# Patient Record
Sex: Male | Born: 1950 | Race: White | Hispanic: No | Smoking: Former smoker
Health system: Southern US, Community
[De-identification: ages and names within clinical notes are randomized; demographics above are authoritative.]

## PROBLEM LIST (undated history)

## (undated) DIAGNOSIS — E119 Type 2 diabetes mellitus without complications: Secondary | ICD-10-CM

## (undated) DIAGNOSIS — E785 Hyperlipidemia, unspecified: Secondary | ICD-10-CM

## (undated) DIAGNOSIS — J9610 Chronic respiratory failure, unspecified whether with hypoxia or hypercapnia: Secondary | ICD-10-CM

## (undated) DIAGNOSIS — M1990 Unspecified osteoarthritis, unspecified site: Secondary | ICD-10-CM

## (undated) DIAGNOSIS — I255 Ischemic cardiomyopathy: Secondary | ICD-10-CM

## (undated) DIAGNOSIS — I739 Peripheral vascular disease, unspecified: Secondary | ICD-10-CM

## (undated) DIAGNOSIS — I482 Chronic atrial fibrillation, unspecified: Secondary | ICD-10-CM

## (undated) DIAGNOSIS — R0600 Dyspnea, unspecified: Secondary | ICD-10-CM

## (undated) DIAGNOSIS — F1010 Alcohol abuse, uncomplicated: Secondary | ICD-10-CM

## (undated) DIAGNOSIS — I509 Heart failure, unspecified: Secondary | ICD-10-CM

## (undated) DIAGNOSIS — J449 Chronic obstructive pulmonary disease, unspecified: Secondary | ICD-10-CM

## (undated) DIAGNOSIS — I10 Essential (primary) hypertension: Secondary | ICD-10-CM

## (undated) DIAGNOSIS — Z95810 Presence of automatic (implantable) cardiac defibrillator: Secondary | ICD-10-CM

## (undated) DIAGNOSIS — Z7901 Long term (current) use of anticoagulants: Secondary | ICD-10-CM

## (undated) HISTORY — DX: Ischemic cardiomyopathy: I25.5

## (undated) HISTORY — DX: Peripheral vascular disease, unspecified: I73.9

## (undated) HISTORY — PX: CHOLECYSTECTOMY: SHX55

## (undated) HISTORY — DX: Essential (primary) hypertension: I10

## (undated) HISTORY — DX: Long term (current) use of anticoagulants: Z79.01

## (undated) HISTORY — DX: Chronic respiratory failure, unspecified whether with hypoxia or hypercapnia: J96.10

## (undated) HISTORY — DX: Alcohol abuse, uncomplicated: F10.10

## (undated) HISTORY — DX: Hyperlipidemia, unspecified: E78.5

## (undated) HISTORY — DX: Presence of automatic (implantable) cardiac defibrillator: Z95.810

## (undated) HISTORY — PX: ERCP: SHX60

## (undated) HISTORY — DX: Chronic atrial fibrillation, unspecified: I48.20

## (undated) HISTORY — DX: Chronic obstructive pulmonary disease, unspecified: J44.9

## (undated) HISTORY — PX: COLONOSCOPY: SHX174

## (undated) HISTORY — DX: Heart failure, unspecified: I50.9

## (undated) HISTORY — DX: Type 2 diabetes mellitus without complications: E11.9

## (undated) HISTORY — PX: EXCISION OF BREAST BIOPSY: SHX5822

---

## 2014-08-04 DIAGNOSIS — I4891 Unspecified atrial fibrillation: Secondary | ICD-10-CM | POA: Diagnosis not present

## 2014-08-25 DIAGNOSIS — I4891 Unspecified atrial fibrillation: Secondary | ICD-10-CM | POA: Diagnosis not present

## 2014-09-25 DIAGNOSIS — I4891 Unspecified atrial fibrillation: Secondary | ICD-10-CM | POA: Diagnosis not present

## 2014-10-11 DIAGNOSIS — I10 Essential (primary) hypertension: Secondary | ICD-10-CM | POA: Diagnosis not present

## 2014-10-11 DIAGNOSIS — E1165 Type 2 diabetes mellitus with hyperglycemia: Secondary | ICD-10-CM | POA: Diagnosis not present

## 2014-10-11 DIAGNOSIS — E782 Mixed hyperlipidemia: Secondary | ICD-10-CM | POA: Diagnosis not present

## 2014-10-18 DIAGNOSIS — E1169 Type 2 diabetes mellitus with other specified complication: Secondary | ICD-10-CM | POA: Diagnosis not present

## 2014-10-18 DIAGNOSIS — E1165 Type 2 diabetes mellitus with hyperglycemia: Secondary | ICD-10-CM | POA: Diagnosis not present

## 2014-10-18 DIAGNOSIS — J449 Chronic obstructive pulmonary disease, unspecified: Secondary | ICD-10-CM | POA: Diagnosis not present

## 2014-10-18 DIAGNOSIS — I4891 Unspecified atrial fibrillation: Secondary | ICD-10-CM | POA: Diagnosis not present

## 2014-10-24 DIAGNOSIS — I4891 Unspecified atrial fibrillation: Secondary | ICD-10-CM | POA: Diagnosis not present

## 2014-11-07 DIAGNOSIS — I4891 Unspecified atrial fibrillation: Secondary | ICD-10-CM | POA: Diagnosis not present

## 2014-11-16 DIAGNOSIS — Z6839 Body mass index (BMI) 39.0-39.9, adult: Secondary | ICD-10-CM | POA: Diagnosis not present

## 2014-11-16 DIAGNOSIS — J449 Chronic obstructive pulmonary disease, unspecified: Secondary | ICD-10-CM | POA: Diagnosis not present

## 2014-11-21 DIAGNOSIS — I4891 Unspecified atrial fibrillation: Secondary | ICD-10-CM | POA: Diagnosis not present

## 2014-11-29 DIAGNOSIS — Z1389 Encounter for screening for other disorder: Secondary | ICD-10-CM | POA: Diagnosis not present

## 2014-11-29 DIAGNOSIS — I4891 Unspecified atrial fibrillation: Secondary | ICD-10-CM | POA: Diagnosis not present

## 2014-11-29 DIAGNOSIS — Z6841 Body Mass Index (BMI) 40.0 and over, adult: Secondary | ICD-10-CM | POA: Diagnosis not present

## 2014-11-29 DIAGNOSIS — Z0000 Encounter for general adult medical examination without abnormal findings: Secondary | ICD-10-CM | POA: Diagnosis not present

## 2014-11-29 DIAGNOSIS — Z139 Encounter for screening, unspecified: Secondary | ICD-10-CM | POA: Diagnosis not present

## 2014-12-28 DIAGNOSIS — I4891 Unspecified atrial fibrillation: Secondary | ICD-10-CM | POA: Diagnosis not present

## 2015-01-25 DIAGNOSIS — Z6841 Body Mass Index (BMI) 40.0 and over, adult: Secondary | ICD-10-CM | POA: Diagnosis not present

## 2015-01-25 DIAGNOSIS — I4891 Unspecified atrial fibrillation: Secondary | ICD-10-CM | POA: Diagnosis not present

## 2015-01-25 DIAGNOSIS — L0291 Cutaneous abscess, unspecified: Secondary | ICD-10-CM | POA: Diagnosis not present

## 2015-01-26 DIAGNOSIS — I4891 Unspecified atrial fibrillation: Secondary | ICD-10-CM | POA: Diagnosis not present

## 2015-02-06 DIAGNOSIS — I4891 Unspecified atrial fibrillation: Secondary | ICD-10-CM | POA: Diagnosis not present

## 2015-02-08 DIAGNOSIS — E1169 Type 2 diabetes mellitus with other specified complication: Secondary | ICD-10-CM | POA: Diagnosis not present

## 2015-02-08 DIAGNOSIS — E1165 Type 2 diabetes mellitus with hyperglycemia: Secondary | ICD-10-CM | POA: Diagnosis not present

## 2015-02-08 DIAGNOSIS — E1159 Type 2 diabetes mellitus with other circulatory complications: Secondary | ICD-10-CM | POA: Diagnosis not present

## 2015-02-15 DIAGNOSIS — I10 Essential (primary) hypertension: Secondary | ICD-10-CM | POA: Diagnosis not present

## 2015-02-15 DIAGNOSIS — J449 Chronic obstructive pulmonary disease, unspecified: Secondary | ICD-10-CM | POA: Diagnosis not present

## 2015-02-15 DIAGNOSIS — L723 Sebaceous cyst: Secondary | ICD-10-CM | POA: Diagnosis not present

## 2015-02-15 DIAGNOSIS — E1159 Type 2 diabetes mellitus with other circulatory complications: Secondary | ICD-10-CM | POA: Diagnosis not present

## 2015-02-28 DIAGNOSIS — K8050 Calculus of bile duct without cholangitis or cholecystitis without obstruction: Secondary | ICD-10-CM | POA: Diagnosis not present

## 2015-02-28 DIAGNOSIS — J9811 Atelectasis: Secondary | ICD-10-CM | POA: Diagnosis not present

## 2015-02-28 DIAGNOSIS — R079 Chest pain, unspecified: Secondary | ICD-10-CM | POA: Diagnosis not present

## 2015-02-28 DIAGNOSIS — K429 Umbilical hernia without obstruction or gangrene: Secondary | ICD-10-CM | POA: Diagnosis not present

## 2015-02-28 DIAGNOSIS — N200 Calculus of kidney: Secondary | ICD-10-CM | POA: Diagnosis not present

## 2015-03-02 DIAGNOSIS — K8050 Calculus of bile duct without cholangitis or cholecystitis without obstruction: Secondary | ICD-10-CM | POA: Diagnosis not present

## 2015-03-02 DIAGNOSIS — I4891 Unspecified atrial fibrillation: Secondary | ICD-10-CM | POA: Diagnosis not present

## 2015-03-02 DIAGNOSIS — L723 Sebaceous cyst: Secondary | ICD-10-CM | POA: Diagnosis not present

## 2015-03-02 DIAGNOSIS — J449 Chronic obstructive pulmonary disease, unspecified: Secondary | ICD-10-CM | POA: Diagnosis not present

## 2015-03-12 DIAGNOSIS — I4891 Unspecified atrial fibrillation: Secondary | ICD-10-CM | POA: Diagnosis not present

## 2015-03-13 DIAGNOSIS — R1013 Epigastric pain: Secondary | ICD-10-CM | POA: Diagnosis not present

## 2015-03-13 DIAGNOSIS — I4891 Unspecified atrial fibrillation: Secondary | ICD-10-CM | POA: Diagnosis not present

## 2015-03-13 DIAGNOSIS — R1011 Right upper quadrant pain: Secondary | ICD-10-CM | POA: Diagnosis not present

## 2015-03-13 DIAGNOSIS — E119 Type 2 diabetes mellitus without complications: Secondary | ICD-10-CM | POA: Diagnosis not present

## 2015-03-15 DIAGNOSIS — I4891 Unspecified atrial fibrillation: Secondary | ICD-10-CM | POA: Diagnosis not present

## 2015-03-22 DIAGNOSIS — I4891 Unspecified atrial fibrillation: Secondary | ICD-10-CM | POA: Diagnosis not present

## 2015-03-25 DIAGNOSIS — Z01810 Encounter for preprocedural cardiovascular examination: Secondary | ICD-10-CM

## 2015-03-25 HISTORY — DX: Encounter for preprocedural cardiovascular examination: Z01.810

## 2015-03-26 DIAGNOSIS — I482 Chronic atrial fibrillation: Secondary | ICD-10-CM | POA: Diagnosis not present

## 2015-03-26 DIAGNOSIS — Z7901 Long term (current) use of anticoagulants: Secondary | ICD-10-CM | POA: Diagnosis not present

## 2015-03-26 DIAGNOSIS — Z79899 Other long term (current) drug therapy: Secondary | ICD-10-CM | POA: Diagnosis not present

## 2015-03-26 DIAGNOSIS — I5022 Chronic systolic (congestive) heart failure: Secondary | ICD-10-CM | POA: Diagnosis not present

## 2015-03-26 DIAGNOSIS — I110 Hypertensive heart disease with heart failure: Secondary | ICD-10-CM | POA: Diagnosis not present

## 2015-03-29 DIAGNOSIS — I4891 Unspecified atrial fibrillation: Secondary | ICD-10-CM | POA: Diagnosis not present

## 2015-04-06 DIAGNOSIS — K819 Cholecystitis, unspecified: Secondary | ICD-10-CM | POA: Diagnosis not present

## 2015-04-06 DIAGNOSIS — Z79899 Other long term (current) drug therapy: Secondary | ICD-10-CM | POA: Diagnosis not present

## 2015-04-06 DIAGNOSIS — I9581 Postprocedural hypotension: Secondary | ICD-10-CM | POA: Diagnosis not present

## 2015-04-06 DIAGNOSIS — R9431 Abnormal electrocardiogram [ECG] [EKG]: Secondary | ICD-10-CM | POA: Diagnosis not present

## 2015-04-06 DIAGNOSIS — R945 Abnormal results of liver function studies: Secondary | ICD-10-CM | POA: Diagnosis not present

## 2015-04-06 DIAGNOSIS — K8044 Calculus of bile duct with chronic cholecystitis without obstruction: Secondary | ICD-10-CM | POA: Diagnosis not present

## 2015-04-06 DIAGNOSIS — Z9049 Acquired absence of other specified parts of digestive tract: Secondary | ICD-10-CM | POA: Diagnosis not present

## 2015-04-06 DIAGNOSIS — I482 Chronic atrial fibrillation: Secondary | ICD-10-CM | POA: Diagnosis not present

## 2015-04-06 DIAGNOSIS — K8071 Calculus of gallbladder and bile duct without cholecystitis with obstruction: Secondary | ICD-10-CM | POA: Diagnosis not present

## 2015-04-06 DIAGNOSIS — I420 Dilated cardiomyopathy: Secondary | ICD-10-CM | POA: Diagnosis not present

## 2015-04-06 DIAGNOSIS — I429 Cardiomyopathy, unspecified: Secondary | ICD-10-CM | POA: Diagnosis not present

## 2015-04-06 DIAGNOSIS — Z6837 Body mass index (BMI) 37.0-37.9, adult: Secondary | ICD-10-CM | POA: Diagnosis not present

## 2015-04-06 DIAGNOSIS — R000 Tachycardia, unspecified: Secondary | ICD-10-CM | POA: Diagnosis not present

## 2015-04-06 DIAGNOSIS — K8050 Calculus of bile duct without cholangitis or cholecystitis without obstruction: Secondary | ICD-10-CM | POA: Diagnosis not present

## 2015-04-06 DIAGNOSIS — K8040 Calculus of bile duct with cholecystitis, unspecified, without obstruction: Secondary | ICD-10-CM | POA: Diagnosis not present

## 2015-04-06 DIAGNOSIS — I5042 Chronic combined systolic (congestive) and diastolic (congestive) heart failure: Secondary | ICD-10-CM | POA: Diagnosis not present

## 2015-04-06 DIAGNOSIS — E669 Obesity, unspecified: Secondary | ICD-10-CM | POA: Diagnosis not present

## 2015-04-06 DIAGNOSIS — J45909 Unspecified asthma, uncomplicated: Secondary | ICD-10-CM | POA: Diagnosis not present

## 2015-04-06 DIAGNOSIS — K8051 Calculus of bile duct without cholangitis or cholecystitis with obstruction: Secondary | ICD-10-CM | POA: Diagnosis not present

## 2015-04-06 DIAGNOSIS — J449 Chronic obstructive pulmonary disease, unspecified: Secondary | ICD-10-CM | POA: Diagnosis not present

## 2015-04-06 DIAGNOSIS — E780 Pure hypercholesterolemia: Secondary | ICD-10-CM | POA: Diagnosis not present

## 2015-04-06 DIAGNOSIS — I5022 Chronic systolic (congestive) heart failure: Secondary | ICD-10-CM | POA: Diagnosis not present

## 2015-04-06 DIAGNOSIS — R0602 Shortness of breath: Secondary | ICD-10-CM | POA: Diagnosis not present

## 2015-04-06 DIAGNOSIS — Z7901 Long term (current) use of anticoagulants: Secondary | ICD-10-CM | POA: Diagnosis not present

## 2015-04-06 DIAGNOSIS — I447 Left bundle-branch block, unspecified: Secondary | ICD-10-CM | POA: Diagnosis not present

## 2015-04-06 DIAGNOSIS — E1165 Type 2 diabetes mellitus with hyperglycemia: Secondary | ICD-10-CM | POA: Diagnosis not present

## 2015-04-06 DIAGNOSIS — Z23 Encounter for immunization: Secondary | ICD-10-CM | POA: Diagnosis not present

## 2015-04-06 DIAGNOSIS — Z87891 Personal history of nicotine dependence: Secondary | ICD-10-CM | POA: Diagnosis not present

## 2015-04-06 DIAGNOSIS — I10 Essential (primary) hypertension: Secondary | ICD-10-CM | POA: Diagnosis not present

## 2015-04-06 DIAGNOSIS — K8010 Calculus of gallbladder with chronic cholecystitis without obstruction: Secondary | ICD-10-CM | POA: Diagnosis not present

## 2015-04-06 DIAGNOSIS — I4891 Unspecified atrial fibrillation: Secondary | ICD-10-CM | POA: Diagnosis not present

## 2015-04-06 DIAGNOSIS — I9789 Other postprocedural complications and disorders of the circulatory system, not elsewhere classified: Secondary | ICD-10-CM | POA: Diagnosis not present

## 2015-04-06 DIAGNOSIS — I959 Hypotension, unspecified: Secondary | ICD-10-CM | POA: Diagnosis not present

## 2015-04-06 DIAGNOSIS — I519 Heart disease, unspecified: Secondary | ICD-10-CM | POA: Diagnosis not present

## 2015-04-08 DIAGNOSIS — I4891 Unspecified atrial fibrillation: Secondary | ICD-10-CM | POA: Diagnosis not present

## 2015-04-08 DIAGNOSIS — I447 Left bundle-branch block, unspecified: Secondary | ICD-10-CM | POA: Diagnosis not present

## 2015-04-13 DIAGNOSIS — Z7901 Long term (current) use of anticoagulants: Secondary | ICD-10-CM | POA: Diagnosis not present

## 2015-04-13 DIAGNOSIS — I447 Left bundle-branch block, unspecified: Secondary | ICD-10-CM | POA: Diagnosis not present

## 2015-04-13 DIAGNOSIS — J45909 Unspecified asthma, uncomplicated: Secondary | ICD-10-CM | POA: Diagnosis not present

## 2015-04-13 DIAGNOSIS — Z79899 Other long term (current) drug therapy: Secondary | ICD-10-CM | POA: Diagnosis not present

## 2015-04-13 DIAGNOSIS — Z87891 Personal history of nicotine dependence: Secondary | ICD-10-CM | POA: Diagnosis not present

## 2015-04-13 DIAGNOSIS — I482 Chronic atrial fibrillation: Secondary | ICD-10-CM | POA: Diagnosis not present

## 2015-04-13 DIAGNOSIS — J449 Chronic obstructive pulmonary disease, unspecified: Secondary | ICD-10-CM | POA: Diagnosis not present

## 2015-04-13 DIAGNOSIS — Z23 Encounter for immunization: Secondary | ICD-10-CM | POA: Diagnosis not present

## 2015-04-13 DIAGNOSIS — I10 Essential (primary) hypertension: Secondary | ICD-10-CM | POA: Diagnosis not present

## 2015-04-13 DIAGNOSIS — I429 Cardiomyopathy, unspecified: Secondary | ICD-10-CM | POA: Diagnosis not present

## 2015-04-13 DIAGNOSIS — E780 Pure hypercholesterolemia: Secondary | ICD-10-CM | POA: Diagnosis not present

## 2015-04-13 DIAGNOSIS — I519 Heart disease, unspecified: Secondary | ICD-10-CM | POA: Diagnosis not present

## 2015-04-13 DIAGNOSIS — I9789 Other postprocedural complications and disorders of the circulatory system, not elsewhere classified: Secondary | ICD-10-CM | POA: Diagnosis not present

## 2015-04-13 DIAGNOSIS — I5022 Chronic systolic (congestive) heart failure: Secondary | ICD-10-CM | POA: Diagnosis not present

## 2015-04-13 DIAGNOSIS — E1165 Type 2 diabetes mellitus with hyperglycemia: Secondary | ICD-10-CM | POA: Diagnosis not present

## 2015-04-13 DIAGNOSIS — I9581 Postprocedural hypotension: Secondary | ICD-10-CM | POA: Diagnosis not present

## 2015-04-13 DIAGNOSIS — K8044 Calculus of bile duct with chronic cholecystitis without obstruction: Secondary | ICD-10-CM | POA: Diagnosis not present

## 2015-04-14 DIAGNOSIS — J449 Chronic obstructive pulmonary disease, unspecified: Secondary | ICD-10-CM | POA: Diagnosis not present

## 2015-04-23 DIAGNOSIS — I5022 Chronic systolic (congestive) heart failure: Secondary | ICD-10-CM | POA: Diagnosis not present

## 2015-04-23 DIAGNOSIS — K8040 Calculus of bile duct with cholecystitis, unspecified, without obstruction: Secondary | ICD-10-CM | POA: Diagnosis not present

## 2015-04-23 DIAGNOSIS — I4891 Unspecified atrial fibrillation: Secondary | ICD-10-CM | POA: Diagnosis not present

## 2015-04-23 DIAGNOSIS — Z9049 Acquired absence of other specified parts of digestive tract: Secondary | ICD-10-CM | POA: Diagnosis not present

## 2015-05-02 DIAGNOSIS — I4891 Unspecified atrial fibrillation: Secondary | ICD-10-CM | POA: Diagnosis not present

## 2015-05-08 DIAGNOSIS — I482 Chronic atrial fibrillation: Secondary | ICD-10-CM | POA: Diagnosis not present

## 2015-05-08 DIAGNOSIS — I110 Hypertensive heart disease with heart failure: Secondary | ICD-10-CM | POA: Diagnosis not present

## 2015-05-08 DIAGNOSIS — I5022 Chronic systolic (congestive) heart failure: Secondary | ICD-10-CM | POA: Diagnosis not present

## 2015-05-14 DIAGNOSIS — J449 Chronic obstructive pulmonary disease, unspecified: Secondary | ICD-10-CM | POA: Diagnosis not present

## 2015-05-15 DIAGNOSIS — J449 Chronic obstructive pulmonary disease, unspecified: Secondary | ICD-10-CM | POA: Diagnosis not present

## 2015-05-22 DIAGNOSIS — E785 Hyperlipidemia, unspecified: Secondary | ICD-10-CM | POA: Diagnosis not present

## 2015-05-22 DIAGNOSIS — Z6836 Body mass index (BMI) 36.0-36.9, adult: Secondary | ICD-10-CM | POA: Diagnosis not present

## 2015-05-22 DIAGNOSIS — E1165 Type 2 diabetes mellitus with hyperglycemia: Secondary | ICD-10-CM | POA: Diagnosis not present

## 2015-05-22 DIAGNOSIS — I10 Essential (primary) hypertension: Secondary | ICD-10-CM | POA: Diagnosis not present

## 2015-05-22 DIAGNOSIS — E119 Type 2 diabetes mellitus without complications: Secondary | ICD-10-CM | POA: Diagnosis not present

## 2015-06-06 DIAGNOSIS — E1165 Type 2 diabetes mellitus with hyperglycemia: Secondary | ICD-10-CM | POA: Diagnosis not present

## 2015-06-06 DIAGNOSIS — I4891 Unspecified atrial fibrillation: Secondary | ICD-10-CM | POA: Diagnosis not present

## 2015-06-14 DIAGNOSIS — J449 Chronic obstructive pulmonary disease, unspecified: Secondary | ICD-10-CM | POA: Diagnosis not present

## 2015-06-15 DIAGNOSIS — J449 Chronic obstructive pulmonary disease, unspecified: Secondary | ICD-10-CM | POA: Diagnosis not present

## 2015-06-20 DIAGNOSIS — I482 Chronic atrial fibrillation: Secondary | ICD-10-CM | POA: Diagnosis not present

## 2015-06-20 DIAGNOSIS — I447 Left bundle-branch block, unspecified: Secondary | ICD-10-CM

## 2015-06-20 DIAGNOSIS — I5022 Chronic systolic (congestive) heart failure: Secondary | ICD-10-CM | POA: Diagnosis not present

## 2015-06-20 DIAGNOSIS — I110 Hypertensive heart disease with heart failure: Secondary | ICD-10-CM | POA: Diagnosis not present

## 2015-06-20 HISTORY — DX: Left bundle-branch block, unspecified: I44.7

## 2015-06-26 DIAGNOSIS — I4891 Unspecified atrial fibrillation: Secondary | ICD-10-CM | POA: Diagnosis not present

## 2015-07-14 DIAGNOSIS — J449 Chronic obstructive pulmonary disease, unspecified: Secondary | ICD-10-CM | POA: Diagnosis not present

## 2015-11-06 DIAGNOSIS — I255 Ischemic cardiomyopathy: Secondary | ICD-10-CM | POA: Insufficient documentation

## 2016-08-07 DIAGNOSIS — G894 Chronic pain syndrome: Secondary | ICD-10-CM | POA: Diagnosis not present

## 2016-08-07 DIAGNOSIS — M1611 Unilateral primary osteoarthritis, right hip: Secondary | ICD-10-CM | POA: Diagnosis not present

## 2016-08-07 DIAGNOSIS — Z720 Tobacco use: Secondary | ICD-10-CM | POA: Diagnosis not present

## 2016-08-07 DIAGNOSIS — J449 Chronic obstructive pulmonary disease, unspecified: Secondary | ICD-10-CM | POA: Diagnosis not present

## 2016-08-07 DIAGNOSIS — M1612 Unilateral primary osteoarthritis, left hip: Secondary | ICD-10-CM | POA: Diagnosis not present

## 2016-08-07 DIAGNOSIS — M170 Bilateral primary osteoarthritis of knee: Secondary | ICD-10-CM | POA: Diagnosis not present

## 2016-08-07 DIAGNOSIS — M159 Polyosteoarthritis, unspecified: Secondary | ICD-10-CM | POA: Diagnosis not present

## 2016-08-20 DIAGNOSIS — I5022 Chronic systolic (congestive) heart failure: Secondary | ICD-10-CM | POA: Diagnosis not present

## 2016-08-20 DIAGNOSIS — Z95810 Presence of automatic (implantable) cardiac defibrillator: Secondary | ICD-10-CM | POA: Diagnosis not present

## 2016-08-25 DIAGNOSIS — E119 Type 2 diabetes mellitus without complications: Secondary | ICD-10-CM | POA: Diagnosis not present

## 2016-08-25 DIAGNOSIS — E7800 Pure hypercholesterolemia, unspecified: Secondary | ICD-10-CM | POA: Diagnosis not present

## 2016-08-25 DIAGNOSIS — J449 Chronic obstructive pulmonary disease, unspecified: Secondary | ICD-10-CM | POA: Diagnosis not present

## 2016-08-25 DIAGNOSIS — I10 Essential (primary) hypertension: Secondary | ICD-10-CM | POA: Diagnosis not present

## 2016-08-25 DIAGNOSIS — I4891 Unspecified atrial fibrillation: Secondary | ICD-10-CM | POA: Diagnosis not present

## 2016-08-26 DIAGNOSIS — E119 Type 2 diabetes mellitus without complications: Secondary | ICD-10-CM | POA: Diagnosis not present

## 2016-08-26 DIAGNOSIS — I4891 Unspecified atrial fibrillation: Secondary | ICD-10-CM | POA: Diagnosis not present

## 2016-09-03 DIAGNOSIS — I420 Dilated cardiomyopathy: Secondary | ICD-10-CM | POA: Diagnosis not present

## 2016-09-03 DIAGNOSIS — I447 Left bundle-branch block, unspecified: Secondary | ICD-10-CM | POA: Diagnosis not present

## 2016-09-03 DIAGNOSIS — I5022 Chronic systolic (congestive) heart failure: Secondary | ICD-10-CM | POA: Diagnosis not present

## 2016-09-03 DIAGNOSIS — I110 Hypertensive heart disease with heart failure: Secondary | ICD-10-CM | POA: Diagnosis not present

## 2016-09-03 DIAGNOSIS — Z95810 Presence of automatic (implantable) cardiac defibrillator: Secondary | ICD-10-CM | POA: Diagnosis not present

## 2016-09-03 DIAGNOSIS — Z7901 Long term (current) use of anticoagulants: Secondary | ICD-10-CM | POA: Diagnosis not present

## 2016-09-03 DIAGNOSIS — I482 Chronic atrial fibrillation: Secondary | ICD-10-CM | POA: Diagnosis not present

## 2016-09-04 DIAGNOSIS — M170 Bilateral primary osteoarthritis of knee: Secondary | ICD-10-CM | POA: Diagnosis not present

## 2016-09-04 DIAGNOSIS — M1612 Unilateral primary osteoarthritis, left hip: Secondary | ICD-10-CM | POA: Diagnosis not present

## 2016-09-04 DIAGNOSIS — J449 Chronic obstructive pulmonary disease, unspecified: Secondary | ICD-10-CM | POA: Diagnosis not present

## 2016-09-04 DIAGNOSIS — Z720 Tobacco use: Secondary | ICD-10-CM | POA: Diagnosis not present

## 2016-09-04 DIAGNOSIS — M1611 Unilateral primary osteoarthritis, right hip: Secondary | ICD-10-CM | POA: Diagnosis not present

## 2016-09-04 DIAGNOSIS — G894 Chronic pain syndrome: Secondary | ICD-10-CM | POA: Diagnosis not present

## 2016-09-04 DIAGNOSIS — M159 Polyosteoarthritis, unspecified: Secondary | ICD-10-CM | POA: Diagnosis not present

## 2016-09-05 DIAGNOSIS — I4891 Unspecified atrial fibrillation: Secondary | ICD-10-CM | POA: Diagnosis not present

## 2016-09-05 DIAGNOSIS — Z7901 Long term (current) use of anticoagulants: Secondary | ICD-10-CM | POA: Diagnosis not present

## 2016-09-30 DIAGNOSIS — I4891 Unspecified atrial fibrillation: Secondary | ICD-10-CM | POA: Diagnosis not present

## 2016-10-02 DIAGNOSIS — M1611 Unilateral primary osteoarthritis, right hip: Secondary | ICD-10-CM | POA: Diagnosis not present

## 2016-10-02 DIAGNOSIS — M159 Polyosteoarthritis, unspecified: Secondary | ICD-10-CM | POA: Diagnosis not present

## 2016-10-02 DIAGNOSIS — Z7901 Long term (current) use of anticoagulants: Secondary | ICD-10-CM | POA: Diagnosis not present

## 2016-10-02 DIAGNOSIS — M1612 Unilateral primary osteoarthritis, left hip: Secondary | ICD-10-CM | POA: Diagnosis not present

## 2016-10-02 DIAGNOSIS — G894 Chronic pain syndrome: Secondary | ICD-10-CM | POA: Diagnosis not present

## 2016-10-02 DIAGNOSIS — Z720 Tobacco use: Secondary | ICD-10-CM | POA: Diagnosis not present

## 2016-10-02 DIAGNOSIS — M170 Bilateral primary osteoarthritis of knee: Secondary | ICD-10-CM | POA: Diagnosis not present

## 2016-10-02 DIAGNOSIS — J449 Chronic obstructive pulmonary disease, unspecified: Secondary | ICD-10-CM | POA: Diagnosis not present

## 2016-10-13 DIAGNOSIS — I4891 Unspecified atrial fibrillation: Secondary | ICD-10-CM | POA: Diagnosis not present

## 2016-10-14 DIAGNOSIS — Z7901 Long term (current) use of anticoagulants: Secondary | ICD-10-CM | POA: Diagnosis not present

## 2016-10-24 DIAGNOSIS — I4891 Unspecified atrial fibrillation: Secondary | ICD-10-CM | POA: Diagnosis not present

## 2016-10-27 DIAGNOSIS — Z7901 Long term (current) use of anticoagulants: Secondary | ICD-10-CM | POA: Diagnosis not present

## 2016-10-30 DIAGNOSIS — M159 Polyosteoarthritis, unspecified: Secondary | ICD-10-CM | POA: Diagnosis not present

## 2016-10-30 DIAGNOSIS — Z720 Tobacco use: Secondary | ICD-10-CM | POA: Diagnosis not present

## 2016-10-30 DIAGNOSIS — M170 Bilateral primary osteoarthritis of knee: Secondary | ICD-10-CM | POA: Diagnosis not present

## 2016-10-30 DIAGNOSIS — M1612 Unilateral primary osteoarthritis, left hip: Secondary | ICD-10-CM | POA: Diagnosis not present

## 2016-10-30 DIAGNOSIS — M1611 Unilateral primary osteoarthritis, right hip: Secondary | ICD-10-CM | POA: Diagnosis not present

## 2016-10-30 DIAGNOSIS — J449 Chronic obstructive pulmonary disease, unspecified: Secondary | ICD-10-CM | POA: Diagnosis not present

## 2016-10-30 DIAGNOSIS — G894 Chronic pain syndrome: Secondary | ICD-10-CM | POA: Diagnosis not present

## 2016-11-19 DIAGNOSIS — I5022 Chronic systolic (congestive) heart failure: Secondary | ICD-10-CM | POA: Diagnosis not present

## 2016-11-19 DIAGNOSIS — Z95810 Presence of automatic (implantable) cardiac defibrillator: Secondary | ICD-10-CM | POA: Diagnosis not present

## 2016-11-24 DIAGNOSIS — E7800 Pure hypercholesterolemia, unspecified: Secondary | ICD-10-CM | POA: Diagnosis not present

## 2016-11-24 DIAGNOSIS — E119 Type 2 diabetes mellitus without complications: Secondary | ICD-10-CM | POA: Diagnosis not present

## 2016-11-24 DIAGNOSIS — I4891 Unspecified atrial fibrillation: Secondary | ICD-10-CM | POA: Diagnosis not present

## 2016-11-24 DIAGNOSIS — I10 Essential (primary) hypertension: Secondary | ICD-10-CM | POA: Diagnosis not present

## 2016-11-24 DIAGNOSIS — Z79899 Other long term (current) drug therapy: Secondary | ICD-10-CM | POA: Diagnosis not present

## 2016-11-24 DIAGNOSIS — Z7901 Long term (current) use of anticoagulants: Secondary | ICD-10-CM | POA: Diagnosis not present

## 2016-11-26 DIAGNOSIS — Z79899 Other long term (current) drug therapy: Secondary | ICD-10-CM | POA: Diagnosis not present

## 2016-11-26 DIAGNOSIS — Z7901 Long term (current) use of anticoagulants: Secondary | ICD-10-CM | POA: Diagnosis not present

## 2016-11-26 DIAGNOSIS — E7800 Pure hypercholesterolemia, unspecified: Secondary | ICD-10-CM | POA: Diagnosis not present

## 2016-11-26 DIAGNOSIS — E119 Type 2 diabetes mellitus without complications: Secondary | ICD-10-CM | POA: Diagnosis not present

## 2016-12-03 DIAGNOSIS — M159 Polyosteoarthritis, unspecified: Secondary | ICD-10-CM | POA: Diagnosis not present

## 2016-12-03 DIAGNOSIS — M1611 Unilateral primary osteoarthritis, right hip: Secondary | ICD-10-CM | POA: Diagnosis not present

## 2016-12-03 DIAGNOSIS — M170 Bilateral primary osteoarthritis of knee: Secondary | ICD-10-CM | POA: Diagnosis not present

## 2016-12-03 DIAGNOSIS — Z720 Tobacco use: Secondary | ICD-10-CM | POA: Diagnosis not present

## 2016-12-03 DIAGNOSIS — J449 Chronic obstructive pulmonary disease, unspecified: Secondary | ICD-10-CM | POA: Diagnosis not present

## 2016-12-03 DIAGNOSIS — G894 Chronic pain syndrome: Secondary | ICD-10-CM | POA: Diagnosis not present

## 2016-12-03 DIAGNOSIS — M1612 Unilateral primary osteoarthritis, left hip: Secondary | ICD-10-CM | POA: Diagnosis not present

## 2016-12-30 DIAGNOSIS — I4891 Unspecified atrial fibrillation: Secondary | ICD-10-CM | POA: Diagnosis not present

## 2016-12-31 DIAGNOSIS — G894 Chronic pain syndrome: Secondary | ICD-10-CM | POA: Diagnosis not present

## 2016-12-31 DIAGNOSIS — J449 Chronic obstructive pulmonary disease, unspecified: Secondary | ICD-10-CM | POA: Diagnosis not present

## 2016-12-31 DIAGNOSIS — M1612 Unilateral primary osteoarthritis, left hip: Secondary | ICD-10-CM | POA: Diagnosis not present

## 2016-12-31 DIAGNOSIS — M170 Bilateral primary osteoarthritis of knee: Secondary | ICD-10-CM | POA: Diagnosis not present

## 2016-12-31 DIAGNOSIS — M1611 Unilateral primary osteoarthritis, right hip: Secondary | ICD-10-CM | POA: Diagnosis not present

## 2016-12-31 DIAGNOSIS — Z720 Tobacco use: Secondary | ICD-10-CM | POA: Diagnosis not present

## 2016-12-31 DIAGNOSIS — M159 Polyosteoarthritis, unspecified: Secondary | ICD-10-CM | POA: Diagnosis not present

## 2017-01-09 DIAGNOSIS — Z95810 Presence of automatic (implantable) cardiac defibrillator: Secondary | ICD-10-CM | POA: Diagnosis not present

## 2017-01-09 DIAGNOSIS — Z4502 Encounter for adjustment and management of automatic implantable cardiac defibrillator: Secondary | ICD-10-CM | POA: Diagnosis not present

## 2017-01-26 DIAGNOSIS — I4891 Unspecified atrial fibrillation: Secondary | ICD-10-CM | POA: Diagnosis not present

## 2017-01-26 DIAGNOSIS — Z7901 Long term (current) use of anticoagulants: Secondary | ICD-10-CM | POA: Diagnosis not present

## 2017-01-29 DIAGNOSIS — J449 Chronic obstructive pulmonary disease, unspecified: Secondary | ICD-10-CM | POA: Diagnosis not present

## 2017-01-29 DIAGNOSIS — M1612 Unilateral primary osteoarthritis, left hip: Secondary | ICD-10-CM | POA: Diagnosis not present

## 2017-01-29 DIAGNOSIS — M159 Polyosteoarthritis, unspecified: Secondary | ICD-10-CM | POA: Diagnosis not present

## 2017-01-29 DIAGNOSIS — M1611 Unilateral primary osteoarthritis, right hip: Secondary | ICD-10-CM | POA: Diagnosis not present

## 2017-01-29 DIAGNOSIS — Z720 Tobacco use: Secondary | ICD-10-CM | POA: Diagnosis not present

## 2017-01-29 DIAGNOSIS — G894 Chronic pain syndrome: Secondary | ICD-10-CM | POA: Diagnosis not present

## 2017-01-29 DIAGNOSIS — M170 Bilateral primary osteoarthritis of knee: Secondary | ICD-10-CM | POA: Diagnosis not present

## 2017-02-09 DIAGNOSIS — I4891 Unspecified atrial fibrillation: Secondary | ICD-10-CM | POA: Diagnosis not present

## 2017-02-11 DIAGNOSIS — Z7901 Long term (current) use of anticoagulants: Secondary | ICD-10-CM | POA: Diagnosis not present

## 2017-02-18 DIAGNOSIS — I5022 Chronic systolic (congestive) heart failure: Secondary | ICD-10-CM | POA: Diagnosis not present

## 2017-02-18 DIAGNOSIS — Z95810 Presence of automatic (implantable) cardiac defibrillator: Secondary | ICD-10-CM | POA: Diagnosis not present

## 2017-02-25 DIAGNOSIS — Z79899 Other long term (current) drug therapy: Secondary | ICD-10-CM | POA: Diagnosis not present

## 2017-02-25 DIAGNOSIS — I10 Essential (primary) hypertension: Secondary | ICD-10-CM | POA: Diagnosis not present

## 2017-02-25 DIAGNOSIS — I4891 Unspecified atrial fibrillation: Secondary | ICD-10-CM | POA: Diagnosis not present

## 2017-02-25 DIAGNOSIS — E119 Type 2 diabetes mellitus without complications: Secondary | ICD-10-CM | POA: Diagnosis not present

## 2017-02-25 DIAGNOSIS — Z7901 Long term (current) use of anticoagulants: Secondary | ICD-10-CM | POA: Diagnosis not present

## 2017-02-25 DIAGNOSIS — E7800 Pure hypercholesterolemia, unspecified: Secondary | ICD-10-CM | POA: Diagnosis not present

## 2017-02-26 DIAGNOSIS — Z720 Tobacco use: Secondary | ICD-10-CM | POA: Diagnosis not present

## 2017-02-26 DIAGNOSIS — G894 Chronic pain syndrome: Secondary | ICD-10-CM | POA: Diagnosis not present

## 2017-02-26 DIAGNOSIS — M1612 Unilateral primary osteoarthritis, left hip: Secondary | ICD-10-CM | POA: Diagnosis not present

## 2017-02-26 DIAGNOSIS — M1611 Unilateral primary osteoarthritis, right hip: Secondary | ICD-10-CM | POA: Diagnosis not present

## 2017-02-26 DIAGNOSIS — M159 Polyosteoarthritis, unspecified: Secondary | ICD-10-CM | POA: Diagnosis not present

## 2017-02-26 DIAGNOSIS — J449 Chronic obstructive pulmonary disease, unspecified: Secondary | ICD-10-CM | POA: Diagnosis not present

## 2017-02-26 DIAGNOSIS — M170 Bilateral primary osteoarthritis of knee: Secondary | ICD-10-CM | POA: Diagnosis not present

## 2017-03-12 DIAGNOSIS — I482 Chronic atrial fibrillation, unspecified: Secondary | ICD-10-CM

## 2017-03-12 DIAGNOSIS — E119 Type 2 diabetes mellitus without complications: Secondary | ICD-10-CM

## 2017-03-12 DIAGNOSIS — I739 Peripheral vascular disease, unspecified: Secondary | ICD-10-CM

## 2017-03-12 DIAGNOSIS — E785 Hyperlipidemia, unspecified: Secondary | ICD-10-CM

## 2017-03-12 DIAGNOSIS — Z7901 Long term (current) use of anticoagulants: Secondary | ICD-10-CM

## 2017-03-12 DIAGNOSIS — I5022 Chronic systolic (congestive) heart failure: Secondary | ICD-10-CM

## 2017-03-12 DIAGNOSIS — J449 Chronic obstructive pulmonary disease, unspecified: Secondary | ICD-10-CM

## 2017-03-12 DIAGNOSIS — I509 Heart failure, unspecified: Secondary | ICD-10-CM

## 2017-03-12 DIAGNOSIS — I4891 Unspecified atrial fibrillation: Secondary | ICD-10-CM | POA: Diagnosis not present

## 2017-03-12 DIAGNOSIS — I420 Dilated cardiomyopathy: Secondary | ICD-10-CM

## 2017-03-12 DIAGNOSIS — I110 Hypertensive heart disease with heart failure: Secondary | ICD-10-CM | POA: Insufficient documentation

## 2017-03-12 DIAGNOSIS — Z95810 Presence of automatic (implantable) cardiac defibrillator: Secondary | ICD-10-CM

## 2017-03-12 DIAGNOSIS — I10 Essential (primary) hypertension: Secondary | ICD-10-CM

## 2017-03-12 DIAGNOSIS — I255 Ischemic cardiomyopathy: Secondary | ICD-10-CM

## 2017-03-12 HISTORY — DX: Ischemic cardiomyopathy: I25.5

## 2017-03-12 HISTORY — DX: Presence of automatic (implantable) cardiac defibrillator: Z95.810

## 2017-03-12 HISTORY — DX: Dilated cardiomyopathy: I42.0

## 2017-03-12 HISTORY — DX: Type 2 diabetes mellitus without complications: E11.9

## 2017-03-12 HISTORY — DX: Essential (primary) hypertension: I10

## 2017-03-12 HISTORY — DX: Heart failure, unspecified: I50.9

## 2017-03-12 HISTORY — DX: Chronic obstructive pulmonary disease, unspecified: J44.9

## 2017-03-12 HISTORY — DX: Hyperlipidemia, unspecified: E78.5

## 2017-03-12 HISTORY — DX: Hypertensive heart disease with heart failure: I11.0

## 2017-03-12 HISTORY — DX: Long term (current) use of anticoagulants: Z79.01

## 2017-03-12 HISTORY — DX: Chronic atrial fibrillation, unspecified: I48.20

## 2017-03-12 HISTORY — DX: Chronic systolic (congestive) heart failure: I50.22

## 2017-03-12 HISTORY — DX: Peripheral vascular disease, unspecified: I73.9

## 2017-03-13 ENCOUNTER — Ambulatory Visit (INDEPENDENT_AMBULATORY_CARE_PROVIDER_SITE_OTHER): Payer: Medicare Other | Admitting: Cardiology

## 2017-03-13 ENCOUNTER — Encounter: Payer: Self-pay | Admitting: Cardiology

## 2017-03-13 VITALS — BP 110/72 | HR 92 | Ht 67.0 in | Wt 211.1 lb

## 2017-03-13 DIAGNOSIS — I482 Chronic atrial fibrillation, unspecified: Secondary | ICD-10-CM

## 2017-03-13 DIAGNOSIS — R079 Chest pain, unspecified: Secondary | ICD-10-CM

## 2017-03-13 DIAGNOSIS — Z95810 Presence of automatic (implantable) cardiac defibrillator: Secondary | ICD-10-CM | POA: Diagnosis not present

## 2017-03-13 DIAGNOSIS — Z7901 Long term (current) use of anticoagulants: Secondary | ICD-10-CM

## 2017-03-13 DIAGNOSIS — I509 Heart failure, unspecified: Secondary | ICD-10-CM | POA: Diagnosis not present

## 2017-03-13 NOTE — Progress Notes (Signed)
Cardiology Office Note:    Date:  03/13/2017   ID:  Jaime Scott, DOB [birth year], MRN [id number]  PCP:  Greig Right, MD  Cardiologist:  Shirlee More, MD    Referring MD: Greig Right, MD    ASSESSMENT:    1. Chest pain in adult   2. Chronic atrial fibrillation (HCC)   3. Chronic congestive heart failure, unspecified heart failure type ([place])   4. Long term (current) use of anticoagulants   5. Presence of combination internal cardiac defibrillator (ICD) and pacemaker    PLAN:    In order of problems listed above:  1. Atypical symptoms but in a high risk patient. Before making recommendation I asked him to allow me to access his previous records to see if he has had an ischemia evaluation recently. If not he requires myocardial perfusion study if he's had a recent myocardial perfusion study and would consider coronary angiography  2. Stable rate controlled continue beta blocker calcium channel blocker and anticoagulant 3. Stable compensated continue his current diuretic beta blocker Ace inhibitor 4. Continue warfarin managed by his PCP call INR is 2-3.5 5. Stable managed by Dr. Ola Spurr   Next appointment: 4 weeks   Medication Adjustments/Labs and Tests Ordered: Current medicines are reviewed at length with the patient today.  Concerns regarding medicines are outlined above.  Orders Placed This Encounter  Procedures  . EKG 12-Lead   No orders of the defined types were placed in this encounter.   Chief Complaint  Patient presents with  . Follow-up    to discuss CP x 1 month  . Chest Pain  . Shortness of Breath  . Atrial Fibrillation  . Congestive Heart Failure  . Hypertension    History of Present Illness:    Jaime Scott is a 66 y.o. male with a hx of Hypertension, heart failure and atrial fibrillation last seen Within the last year. Unfortunately unable to access any of his previous records through care everywhere. He is a nonischemic  cardiomyopathy artery failure and an ICD with Dr. Adrian Prows. He was seen recently in the [place] office in June. He relates that he's having brief episodes of localized nonexertional vague mild sternal pain. It's not exertional in nature and lasts from seconds to a minute. He has chronic exertional shortness of breath walking outdoors but no edema orthopnea PND palpitation syncope or TIA. Compliance with diet, lifestyle and medications: Yes Past Medical History:  Diagnosis Date  . CHF (congestive heart failure) ([place]) 03/12/2017  . Chronic atrial fibrillation ([place]) 03/12/2017  . COPD (chronic obstructive pulmonary disease) ([place]) 03/12/2017  . Essential hypertension 03/12/2017  . Hyperlipidemia 03/12/2017  . Ischemic cardiomyopathy 03/12/2017  . Long term (current) use of anticoagulants 03/12/2017  . Peripheral vascular disease ([place]) 03/12/2017  . Presence of combination internal cardiac defibrillator (ICD) and pacemaker 03/12/2017  . Type 2 diabetes mellitus without complications ([place]) 03/08/7516    Past Surgical History:  Procedure Laterality Date  . CHOLECYSTECTOMY    . ERCP     stone removal  . EXCISION OF BREAST BIOPSY     Age 32, benign    Current Medications: Current Meds  Medication Sig  . carvedilol (COREG) 6.25 MG tablet Take 6.25 mg by mouth 2 (two) times daily with a meal.  . digoxin (LANOXIN) 0.125 MG tablet Take 0.125 mg by mouth daily.  Marland Kitchen diltiazem (CARDIZEM SR) 120 MG 12 hr capsule Take 120 mg by mouth daily.  . furosemide (LASIX) 80 MG  tablet Take 80 mg by mouth 2 (two) times daily.  Marland Kitchen lisinopril (PRINIVIL,ZESTRIL) 5 MG tablet Take 2.5 mg by mouth daily.  . metFORMIN (GLUCOPHAGE-XR) 500 MG 24 hr tablet Take 500 mg by mouth 2 (two) times daily.  . Oxycodone HCl 10 MG TABS Take 10 mg by mouth every 8 (eight) hours as needed (chronic pain).  . pantoprazole (PROTONIX) 40 MG tablet Take 40 mg by mouth daily.  . potassium chloride SA (K-DUR,KLOR-CON) 20 MEQ tablet Take 10  mEq by mouth daily.  . simvastatin (ZOCOR) 20 MG tablet Take 20 mg by mouth daily.  Marland Kitchen warfarin (COUMADIN) 1 MG tablet Take 1 mg by mouth daily.  Marland Kitchen warfarin (COUMADIN) 5 MG tablet Take 5 mg by mouth daily.     Allergies:   Ivp dye [iodinated diagnostic agents]   Social History   Social History  . Marital status: Divorced    Spouse name: N/A  . Number of children: N/A  . Years of education: N/A   Social History Main Topics  . Smoking status: Current Every Day Smoker    Packs/day: 1.00    Types: Cigarettes  . Smokeless tobacco: Never Used  . Alcohol use No  . Drug use: No  . Sexual activity: Not Asked   Other Topics Concern  . None   Social History Narrative  . None     Family History: The patient's family history includes Alcohol abuse in his brother and brother; Breast cancer in his sister. ROS:   Please see the history of present illness.    All other systems reviewed and are negative.  EKGs/Labs/Other Studies Reviewed:    The following studies were reviewed today:  EKG:  EKG ordered today.  The ekg ordered today demonstrates AF controlled rate and LBBB  Recent Labs:Drawn this week pending from his PCP No results found for requested labs within last 8760 hours.  Recent Lipid Panel No results found for: CHOL, TRIG, HDL, CHOLHDL, VLDL, LDLCALC, LDLDIRECT  Physical Exam:    VS:  BP 110/72 (BP Location: Left Arm, Patient Position: Sitting)   Pulse 92   Ht 5\' 7"  (1.702 m)   Wt 211 lb 1.9 oz (95.8 kg)   SpO2 92%   BMI 33.07 kg/m     Wt Readings from Last 3 Encounters:  03/13/17 211 lb 1.9 oz (95.8 kg)     GEN:  Well nourished, well developed in no acute distress HEENT: Normal NECK: No JVD; No carotid bruits LYMPHATICS: No lymphadenopathy CARDIAC: Irr Irr  no murmurs, rubs, gallops RESPIRATORY:  Clear to auscultation without rales, wheezing or rhonchi  ABDOMEN: Soft, non-tender, non-distended MUSCULOSKELETAL:  No edema; No deformity  SKIN: Warm and  dry NEUROLOGIC:  Alert and oriented x 3 PSYCHIATRIC:  Normal affect    Signed, Shirlee More, MD  03/13/2017 12:55 PM    [facility]

## 2017-03-13 NOTE — Patient Instructions (Signed)
Medication Instructions:  Your physician recommends that you continue on your current medications as directed. Please refer to the Current Medication list given to you today.   Labwork: None  Testing/Procedures: You had an EKG today.  Follow-Up: We will call you when we receive your records.  Any Other Special Instructions Will Be Listed Below (If Applicable).     If you need a refill on your cardiac medications before your next appointment, please call your pharmacy.

## 2017-03-17 ENCOUNTER — Telehealth: Payer: Self-pay

## 2017-03-17 ENCOUNTER — Other Ambulatory Visit: Payer: Self-pay

## 2017-03-17 DIAGNOSIS — R079 Chest pain, unspecified: Secondary | ICD-10-CM

## 2017-03-17 DIAGNOSIS — E119 Type 2 diabetes mellitus without complications: Secondary | ICD-10-CM | POA: Diagnosis not present

## 2017-03-17 DIAGNOSIS — H25813 Combined forms of age-related cataract, bilateral: Secondary | ICD-10-CM | POA: Diagnosis not present

## 2017-03-17 NOTE — Telephone Encounter (Signed)
Attempted to call patient to set up a lexiscan stress test per verbal order from Dr. Bettina Gavia. All 3 numbers in the chart are incorrect. Will send him a letter requesting for him to call the office.

## 2017-03-18 ENCOUNTER — Telehealth: Payer: Self-pay

## 2017-03-18 NOTE — Telephone Encounter (Signed)
Left message for patient to return call to advise of appointment date and time for lexiscan at [facility] 03/25/17 at 9 am, patient to arrive at 8:30 am. Also to review instructions.

## 2017-03-24 NOTE — Telephone Encounter (Signed)
Patient advised of lexiscan stress test scheduled at [facility]. Patient advised to hold carvedilol and metformin before the test. Patient advised not to eat or drink anything after midnight before the test. Patient given instructions for how lexiscan stress test is performed. Patient advised to go to outpatient center at [facility] for test. Patient verbalized understanding. No further questions.

## 2017-03-25 DIAGNOSIS — R079 Chest pain, unspecified: Secondary | ICD-10-CM | POA: Diagnosis not present

## 2017-03-25 DIAGNOSIS — I509 Heart failure, unspecified: Secondary | ICD-10-CM | POA: Diagnosis not present

## 2017-03-26 ENCOUNTER — Telehealth: Payer: Self-pay

## 2017-03-26 NOTE — Telephone Encounter (Signed)
Left message to return call for stress test results.

## 2017-03-26 NOTE — Telephone Encounter (Signed)
Patient advised of normal results

## 2017-03-27 ENCOUNTER — Telehealth: Payer: Self-pay | Admitting: Cardiology

## 2017-03-27 NOTE — Telephone Encounter (Signed)
Message routed to RN

## 2017-03-27 NOTE — Telephone Encounter (Signed)
New Message     Pt called answering service 03/26/17 1:14pm to get test results

## 2017-03-31 DIAGNOSIS — M1611 Unilateral primary osteoarthritis, right hip: Secondary | ICD-10-CM | POA: Diagnosis not present

## 2017-03-31 DIAGNOSIS — M170 Bilateral primary osteoarthritis of knee: Secondary | ICD-10-CM | POA: Diagnosis not present

## 2017-03-31 DIAGNOSIS — J449 Chronic obstructive pulmonary disease, unspecified: Secondary | ICD-10-CM | POA: Diagnosis not present

## 2017-03-31 DIAGNOSIS — M159 Polyosteoarthritis, unspecified: Secondary | ICD-10-CM | POA: Diagnosis not present

## 2017-03-31 DIAGNOSIS — M1612 Unilateral primary osteoarthritis, left hip: Secondary | ICD-10-CM | POA: Diagnosis not present

## 2017-03-31 DIAGNOSIS — Z720 Tobacco use: Secondary | ICD-10-CM | POA: Diagnosis not present

## 2017-03-31 DIAGNOSIS — G894 Chronic pain syndrome: Secondary | ICD-10-CM | POA: Diagnosis not present

## 2017-04-02 DIAGNOSIS — H35413 Lattice degeneration of retina, bilateral: Secondary | ICD-10-CM | POA: Diagnosis not present

## 2017-04-02 DIAGNOSIS — E113293 Type 2 diabetes mellitus with mild nonproliferative diabetic retinopathy without macular edema, bilateral: Secondary | ICD-10-CM | POA: Diagnosis not present

## 2017-04-07 DIAGNOSIS — H33302 Unspecified retinal break, left eye: Secondary | ICD-10-CM | POA: Diagnosis not present

## 2017-04-10 NOTE — Progress Notes (Deleted)
Cardiology Office Note:    Date:  04/10/2017   ID:  Jaime Scott, DOB 06/12/1951, MRN [id number]  PCP:  Greig Right, MD  Cardiologist:  Shirlee More, MD    Referring MD: Greig Right, MD    ASSESSMENT:    No diagnosis found. PLAN:    In order of problems listed above:  1. ***   Next appointment: ***   Medication Adjustments/Labs and Tests Ordered: Current medicines are reviewed at length with the patient today.  Concerns regarding medicines are outlined above.  No orders of the defined types were placed in this encounter.  No orders of the defined types were placed in this encounter.   No chief complaint on file.   History of Present Illness:    Jaime Scott is a 66 y.o. male with a hx of Hypertension, heart failure, ICD and atrial fibrillation  last seen 2 months ago for atypical chest pain.. Compliance with diet, lifestyle and medications: *** Past Medical History:  Diagnosis Date  . CHF (congestive heart failure) ([place]) 03/12/2017  . Chronic atrial fibrillation ([place]) 03/12/2017  . COPD (chronic obstructive pulmonary disease) ([place]) 03/12/2017  . Essential hypertension 03/12/2017  . Hyperlipidemia 03/12/2017  . Ischemic cardiomyopathy 03/12/2017  . Long term (current) use of anticoagulants 03/12/2017  . Peripheral vascular disease ([place]) 03/12/2017  . Presence of combination internal cardiac defibrillator (ICD) and pacemaker 03/12/2017  . Type 2 diabetes mellitus without complications ([place]) 03/28/5175    Past Surgical History:  Procedure Laterality Date  . CHOLECYSTECTOMY    . ERCP     stone removal  . EXCISION OF BREAST BIOPSY     Age 52, benign    Current Medications: No outpatient prescriptions have been marked as taking for the 04/14/17 encounter (Appointment) with Richardo Priest, MD.     Allergies:   Ivp dye [iodinated diagnostic agents]   Social History   Social History  . Marital status: Divorced    Spouse name: N/A  . Number of  children: N/A  . Years of education: N/A   Social History Main Topics  . Smoking status: Current Every Day Smoker    Packs/day: 1.00    Types: Cigarettes  . Smokeless tobacco: Never Used  . Alcohol use No  . Drug use: No  . Sexual activity: Not on file   Other Topics Concern  . Not on file   Social History Narrative  . No narrative on file     Family History: The patient's ***family history includes Alcohol abuse in his brother and brother; Breast cancer in his sister. ROS:   Please see the history of present illness.    All other systems reviewed and are negative.  EKGs/Labs/Other Studies Reviewed:    The following studies were reviewed today:  EKG:  EKG ordered today.  The ekg ordered today demonstrates *** lexiscan MPI 03/25/17: fixed defect EF 25% Recent Labs: No results found for requested labs within last 8760 hours.  Recent Lipid Panel No results found for: CHOL, TRIG, HDL, CHOLHDL, VLDL, LDLCALC, LDLDIRECT  Physical Exam:    VS:  There were no vitals taken for this visit.    Wt Readings from Last 3 Encounters:  03/13/17 211 lb 1.9 oz (95.8 kg)     GEN: *** Well nourished, well developed in no acute distress HEENT: Normal NECK: No JVD; No carotid bruits LYMPHATICS: No lymphadenopathy CARDIAC: ***RRR, no murmurs, rubs, gallops RESPIRATORY:  Clear to auscultation without rales, wheezing or rhonchi  ABDOMEN: Soft, non-tender, non-distended MUSCULOSKELETAL:  No edema; No deformity  SKIN: Warm and dry NEUROLOGIC:  Alert and oriented x 3 PSYCHIATRIC:  Normal affect    Signed, Shirlee More, MD  04/10/2017 4:29 PM    [place]

## 2017-04-14 ENCOUNTER — Ambulatory Visit: Payer: Medicare Other | Admitting: Cardiology

## 2017-04-21 ENCOUNTER — Ambulatory Visit: Payer: Medicare Other | Admitting: Cardiology

## 2017-04-28 DIAGNOSIS — M170 Bilateral primary osteoarthritis of knee: Secondary | ICD-10-CM | POA: Diagnosis not present

## 2017-04-28 DIAGNOSIS — Z720 Tobacco use: Secondary | ICD-10-CM | POA: Diagnosis not present

## 2017-04-28 DIAGNOSIS — J449 Chronic obstructive pulmonary disease, unspecified: Secondary | ICD-10-CM | POA: Diagnosis not present

## 2017-04-28 DIAGNOSIS — M159 Polyosteoarthritis, unspecified: Secondary | ICD-10-CM | POA: Diagnosis not present

## 2017-04-28 DIAGNOSIS — G894 Chronic pain syndrome: Secondary | ICD-10-CM | POA: Diagnosis not present

## 2017-04-28 DIAGNOSIS — M1612 Unilateral primary osteoarthritis, left hip: Secondary | ICD-10-CM | POA: Diagnosis not present

## 2017-04-28 DIAGNOSIS — M1611 Unilateral primary osteoarthritis, right hip: Secondary | ICD-10-CM | POA: Diagnosis not present

## 2017-05-05 ENCOUNTER — Encounter: Payer: Self-pay | Admitting: Cardiology

## 2017-05-05 ENCOUNTER — Ambulatory Visit (INDEPENDENT_AMBULATORY_CARE_PROVIDER_SITE_OTHER): Payer: Medicare Other | Admitting: Cardiology

## 2017-05-05 VITALS — BP 104/66 | HR 99 | Ht 67.0 in | Wt 215.4 lb

## 2017-05-05 DIAGNOSIS — I110 Hypertensive heart disease with heart failure: Secondary | ICD-10-CM | POA: Diagnosis not present

## 2017-05-05 DIAGNOSIS — Z95810 Presence of automatic (implantable) cardiac defibrillator: Secondary | ICD-10-CM | POA: Diagnosis not present

## 2017-05-05 DIAGNOSIS — I420 Dilated cardiomyopathy: Secondary | ICD-10-CM | POA: Diagnosis not present

## 2017-05-05 DIAGNOSIS — Z79899 Other long term (current) drug therapy: Secondary | ICD-10-CM | POA: Diagnosis not present

## 2017-05-05 DIAGNOSIS — I509 Heart failure, unspecified: Secondary | ICD-10-CM

## 2017-05-05 DIAGNOSIS — Z7901 Long term (current) use of anticoagulants: Secondary | ICD-10-CM | POA: Diagnosis not present

## 2017-05-05 HISTORY — DX: Other long term (current) drug therapy: Z79.899

## 2017-05-05 MED ORDER — SPIRONOLACTONE 25 MG PO TABS: 25.0000 mg | ORAL_TABLET | Freq: Every day | ORAL | 3 refills | Status: DC

## 2017-05-05 NOTE — Patient Instructions (Addendum)
Medication Instructions:  Your physician has recommended you make the following change in your medication:   Stop: Potassium Start: Spironolactone 25mg  one tablet daily    Labwork: Your physician recommends that you have lab work in 2 weeks at Dr [employer] office  BMP, BNP, digoxin   Testing/Procedures: None   Follow-Up: Your physician wants you to follow-up in: 3 months.  You will receive a reminder letter in the mail two months in advance. If you don't receive a letter, please call our office to schedule the follow-up appointment.  You will also be called regarding an EP Consult when appt is made.  Any Other Special Instructions Will Be Listed Below (If Applicable).     If you need a refill on your cardiac medications before your next appointment, please call your pharmacy.      Heart Failure  Weigh yourself every morning when you first wake up and record on a calender or note pad, bring this to your office visits. Using a pill tender can help with taking your medications consistently.  Limit your fluid intake to 2 liters daily  Limit your sodium intake to less than 2-3 grams daily. Ask if you need dietary teaching.  If you gain more than 3 pounds (from your dry weight ), double your dose of diuretic for the day.  If you gain more than 5 pounds (from your dry weight), double your dose of lasix and call your heart failure doctor.  Please do not smoke tobacco since it is very bad for your heart.  Please do not drink alcohol since it can worsen your heart failure.Also avoid OTC nonsteroidal drugs, such as advil, aleve and motrin.  Try to exercise for at least 30 minutes every day because this will help your heart be more efficient. You may be eligible for supervised cardiac rehab, ask your physician.

## 2017-05-05 NOTE — Addendum Note (Signed)
Addended by: Stevan Born on: 05/05/2017 02:24 PM   Modules accepted: Orders

## 2017-05-05 NOTE — Progress Notes (Signed)
Cardiology Office Note:    Date:  05/05/2017   ID:  Jaime Scott, DOB [birth year], MRN [id number]  PCP:  Greig Right, MD  Cardiologist:  Shirlee More, MD    Referring MD: Greig Right, MD   Please do a BMP, BNP and digoxin level  In 2 weeks as he started on spironolactone   ASSESSMENT:    1. Dilated cardiomyopathy ([place])   2. Chronic congestive heart failure, unspecified heart failure type ([place])   3. Hypertensive heart disease with heart failure ([place])   4. Long term (current) use of anticoagulants   5. Presence of combination internal cardiac defibrillator (ICD) and pacemaker   6. High risk medication use    PLAN:    In order of problems listed above:  1. Severely reduced ejection fraction, optimize treatment adding MRA and if his BNP was elevated transition to ARNI. If frequently paced in the right ventricle he would need His-Purkinje pacemaker or biventricular pacemaker with AV nodal ablation 2. Stable compensated, continue his current diuretc along with beta blocker Ace and  MRA. 3. Stable continue current treatment 4. Stable continue his current anticoagulant, warfarin INR goal 2.5 5. Stable he will transition to device care in my practice 6. Digoxin level in 2 weeks, goal less than 1.0 to avoid increased mortality with atrial fibrillation and heart failure   Next appointment: 3 months   Medication Adjustments/Labs and Tests Ordered: Current medicines are reviewed at length with the patient today.  Concerns regarding medicines are outlined above.  Orders Placed This Encounter  Procedures  . Basic Metabolic Panel (BMET)  . B Nat Peptide  . Digoxin level  . Ambulatory referral to Cardiac Electrophysiology   Meds ordered this encounter  Medications  . spironolactone (ALDACTONE) 25 MG tablet    Sig: Take 1 tablet (25 mg total) by mouth daily.    Dispense:  90 tablet    Refill:  3    Chief Complaint  Patient presents with  . Follow-up    after nuclear  stress test at [facility] [place] on 03-25-17  . Congestive Heart Failure  . Atrial Fibrillation    History of Present Illness:    Jaime Scott is a 66 y.o. male with a hx of heart failure, nonischemic cardiomyopathy and EF 20% in 2017 , ICD, hypertension, and chronic atrial fibrillation last seen 2 months ago.overall he is pleased with the quality of his life and is not having chest pain shortness of breath palpitation syncope TIA or bleeding with his anticoagulant.his ejection fraction remains severely reduced, I will add MRA to his loop diuretic beta blocker ACE inhibitor and a BNP level is significantly elevated transition to ARNI after labs in 2 weeks. he will transition ICD care to my practice at this time he is rarely ventricular pacedI would not recommend upgrade to biventricular pacing. If he is frequently paced in the right ventricle he would need AV nodal ablation and biventricular pacemaker. Compliance with diet, lifestyle and medications: yes Past Medical History:  Diagnosis Date  . Alcohol abuse   . CHF (congestive heart failure) ([place]) 03/12/2017  . Chronic atrial fibrillation ([place]) 03/12/2017  . COPD (chronic obstructive pulmonary disease) ([place]) 03/12/2017  . Essential hypertension 03/12/2017  . Hyperlipidemia 03/12/2017  . Ischemic cardiomyopathy 03/12/2017  . Long term (current) use of anticoagulants 03/12/2017  . Peripheral vascular disease ([place]) 03/12/2017  . 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  Type 2 diabetes mellitus without complications ([place]) 03/25/5620    Past Surgical History:  Procedure Laterality Date  . CHOLECYSTECTOMY    . ERCP     stone removal  . EXCISION OF BREAST BIOPSY     Age 57, benign    Current Medications: Current Meds  Medication Sig  . carvedilol (COREG) 6.25 MG tablet Take 6.25 mg by mouth 2 (two) times daily with a meal.  . digoxin (LANOXIN) 0.125 MG tablet Take 0.125 mg by mouth  daily.  . furosemide (LASIX) 80 MG tablet Take 80 mg by mouth 2 (two) times daily.  Marland Kitchen lisinopril (PRINIVIL,ZESTRIL) 5 MG tablet Take 2.5 mg by mouth daily.  . metFORMIN (GLUCOPHAGE-XR) 500 MG 24 hr tablet Take 500 mg by mouth 2 (two) times daily.  . Oxycodone HCl 10 MG TABS Take 10 mg by mouth every 8 (eight) hours as needed (chronic pain).  . simvastatin (ZOCOR) 20 MG tablet Take 20 mg by mouth daily.  Marland Kitchen warfarin (COUMADIN) 1 MG tablet Take 1 mg by mouth daily.  Marland Kitchen warfarin (COUMADIN) 5 MG tablet Take 5 mg by mouth daily.  . [DISCONTINUED] potassium chloride SA (K-DUR,KLOR-CON) 20 MEQ tablet Take 10 mEq by mouth daily.     Allergies:   Ivp dye [iodinated diagnostic agents]   Social History   Social History  . Marital status: Divorced    Spouse name: N/A  . Number of children: N/A  . Years of education: N/A   Social History Main Topics  . Smoking status: Current Every Day Smoker    Packs/day: 1.00    Types: Cigarettes  . Smokeless tobacco: Never Used  . Alcohol use No  . Drug use: No  . Sexual activity: Not Asked   Other Topics Concern  . None   Social History Narrative  . None     Family History: The patient's family history includes Alcohol abuse in his brother and brother; Breast cancer in his sister. ROS:   Please see the history of present illness.    All other systems reviewed and are negative.  EKGs/Labs/Other Studies Reviewed:    The following studies were reviewed today:   MPI with extensive fixed defect, no ischemia, EF 25% and mild to moderate LV dialtion Recent Labs: No results found for requested labs within last 8760 hours.  Recent Lipid Panel No results found for: CHOL, TRIG, HDL, CHOLHDL, VLDL, LDLCALC, LDLDIRECT  Physical Exam:    VS:  BP 104/66 (BP Location: Right Arm, Patient Position: Sitting)   Pulse 99   Ht 5\' 7"  (1.702 m)   Wt 215 lb 6.4 oz (97.7 kg)   SpO2 90%   BMI 33.74 kg/m     Wt Readings from Last 3 Encounters:  05/05/17 215  lb 6.4 oz (97.7 kg)  03/13/17 211 lb 1.9 oz (95.8 kg)     GEN:  Well nourished, well developed in no acute distress HEENT: Normal NECK: No JVD; No carotid bruits LYMPHATICS: No lymphadenopathy CARDIAC: irregular irregular variable first heart sound soft S1 no murmurs, rubs, gallops RESPIRATORY:  Clear to auscultation without rales, wheezing or rhonchi  ABDOMEN: Soft, non-tender, non-distended MUSCULOSKELETAL:  No edema; No deformity  SKIN: Warm and dry NEUROLOGIC:  Alert and oriented x 3 PSYCHIATRIC:  Normal affect    Signed, Shirlee More, MD  05/05/2017 12:37 PM    [place]

## 2017-05-06 ENCOUNTER — Ambulatory Visit: Payer: Medicare Other | Admitting: Cardiology

## 2017-05-07 DIAGNOSIS — E113293 Type 2 diabetes mellitus with mild nonproliferative diabetic retinopathy without macular edema, bilateral: Secondary | ICD-10-CM | POA: Diagnosis not present

## 2017-05-19 DIAGNOSIS — I110 Hypertensive heart disease with heart failure: Secondary | ICD-10-CM | POA: Diagnosis not present

## 2017-05-19 DIAGNOSIS — Z7901 Long term (current) use of anticoagulants: Secondary | ICD-10-CM | POA: Diagnosis not present

## 2017-05-19 DIAGNOSIS — I420 Dilated cardiomyopathy: Secondary | ICD-10-CM | POA: Diagnosis not present

## 2017-05-19 DIAGNOSIS — I509 Heart failure, unspecified: Secondary | ICD-10-CM | POA: Diagnosis not present

## 2017-05-19 LAB — BASIC METABOLIC PANEL
BUN / CREAT RATIO: 8 — AB (ref 10–24)
BUN: 5 mg/dL — ABNORMAL LOW (ref 8–27)
CO2: 35 mmol/L — AB (ref 20–29)
CREATININE: 0.6 mg/dL — AB (ref 0.76–1.27)
Calcium: 9.1 mg/dL (ref 8.6–10.2)
Chloride: 94 mmol/L — ABNORMAL LOW (ref 96–106)
GFR calc Af Amer: 121 mL/min/{1.73_m2} (ref >59)
GFR calc non Af Amer: 105 mL/min/{1.73_m2} (ref >59)
GLUCOSE: 163 mg/dL — AB (ref 65–99)
POTASSIUM: 3.6 mmol/L (ref 3.5–5.2)
Sodium: 139 mmol/L (ref 134–144)

## 2017-05-20 DIAGNOSIS — I5022 Chronic systolic (congestive) heart failure: Secondary | ICD-10-CM | POA: Diagnosis not present

## 2017-05-20 DIAGNOSIS — Z4502 Encounter for adjustment and management of automatic implantable cardiac defibrillator: Secondary | ICD-10-CM | POA: Diagnosis not present

## 2017-05-20 LAB — DIGOXIN LEVEL: Digoxin, Serum: 0.6 ng/mL (ref 0.5–0.9)

## 2017-05-20 LAB — BRAIN NATRIURETIC PEPTIDE: BNP: 208.3 pg/mL — AB (ref 0.0–100.0)

## 2017-06-01 DIAGNOSIS — E7800 Pure hypercholesterolemia, unspecified: Secondary | ICD-10-CM | POA: Diagnosis not present

## 2017-06-01 DIAGNOSIS — I4891 Unspecified atrial fibrillation: Secondary | ICD-10-CM | POA: Diagnosis not present

## 2017-06-01 DIAGNOSIS — R21 Rash and other nonspecific skin eruption: Secondary | ICD-10-CM | POA: Diagnosis not present

## 2017-06-01 DIAGNOSIS — I10 Essential (primary) hypertension: Secondary | ICD-10-CM | POA: Diagnosis not present

## 2017-06-01 DIAGNOSIS — J449 Chronic obstructive pulmonary disease, unspecified: Secondary | ICD-10-CM | POA: Diagnosis not present

## 2017-06-01 DIAGNOSIS — Z7901 Long term (current) use of anticoagulants: Secondary | ICD-10-CM | POA: Diagnosis not present

## 2017-06-01 DIAGNOSIS — E119 Type 2 diabetes mellitus without complications: Secondary | ICD-10-CM | POA: Diagnosis not present

## 2017-06-01 DIAGNOSIS — Z79899 Other long term (current) drug therapy: Secondary | ICD-10-CM | POA: Diagnosis not present

## 2017-06-01 DIAGNOSIS — Z23 Encounter for immunization: Secondary | ICD-10-CM | POA: Diagnosis not present

## 2017-06-02 DIAGNOSIS — M1612 Unilateral primary osteoarthritis, left hip: Secondary | ICD-10-CM | POA: Diagnosis not present

## 2017-06-02 DIAGNOSIS — J449 Chronic obstructive pulmonary disease, unspecified: Secondary | ICD-10-CM | POA: Diagnosis not present

## 2017-06-02 DIAGNOSIS — E7800 Pure hypercholesterolemia, unspecified: Secondary | ICD-10-CM | POA: Diagnosis not present

## 2017-06-02 DIAGNOSIS — Z720 Tobacco use: Secondary | ICD-10-CM | POA: Diagnosis not present

## 2017-06-02 DIAGNOSIS — Z7901 Long term (current) use of anticoagulants: Secondary | ICD-10-CM | POA: Diagnosis not present

## 2017-06-02 DIAGNOSIS — M170 Bilateral primary osteoarthritis of knee: Secondary | ICD-10-CM | POA: Diagnosis not present

## 2017-06-02 DIAGNOSIS — M1611 Unilateral primary osteoarthritis, right hip: Secondary | ICD-10-CM | POA: Diagnosis not present

## 2017-06-02 DIAGNOSIS — G894 Chronic pain syndrome: Secondary | ICD-10-CM | POA: Diagnosis not present

## 2017-06-02 DIAGNOSIS — M159 Polyosteoarthritis, unspecified: Secondary | ICD-10-CM | POA: Diagnosis not present

## 2017-06-02 DIAGNOSIS — E119 Type 2 diabetes mellitus without complications: Secondary | ICD-10-CM | POA: Diagnosis not present

## 2017-06-30 DIAGNOSIS — J449 Chronic obstructive pulmonary disease, unspecified: Secondary | ICD-10-CM | POA: Diagnosis not present

## 2017-06-30 DIAGNOSIS — M1611 Unilateral primary osteoarthritis, right hip: Secondary | ICD-10-CM | POA: Diagnosis not present

## 2017-06-30 DIAGNOSIS — M170 Bilateral primary osteoarthritis of knee: Secondary | ICD-10-CM | POA: Diagnosis not present

## 2017-06-30 DIAGNOSIS — G894 Chronic pain syndrome: Secondary | ICD-10-CM | POA: Diagnosis not present

## 2017-06-30 DIAGNOSIS — Z79891 Long term (current) use of opiate analgesic: Secondary | ICD-10-CM | POA: Diagnosis not present

## 2017-06-30 DIAGNOSIS — I4891 Unspecified atrial fibrillation: Secondary | ICD-10-CM | POA: Diagnosis not present

## 2017-06-30 DIAGNOSIS — M159 Polyosteoarthritis, unspecified: Secondary | ICD-10-CM | POA: Diagnosis not present

## 2017-06-30 DIAGNOSIS — Z720 Tobacco use: Secondary | ICD-10-CM | POA: Diagnosis not present

## 2017-06-30 DIAGNOSIS — M1612 Unilateral primary osteoarthritis, left hip: Secondary | ICD-10-CM | POA: Diagnosis not present

## 2017-07-01 DIAGNOSIS — Z7901 Long term (current) use of anticoagulants: Secondary | ICD-10-CM | POA: Diagnosis not present

## 2017-07-15 ENCOUNTER — Encounter: Payer: Medicare Other | Admitting: Cardiology

## 2017-07-15 NOTE — Progress Notes (Deleted)
Electrophysiology Office Note   Date:  07/15/2017   ID:  Jaime Scott, DOB 24-Nov-1950, MRN [id number]  PCP:  Greig Right, MD  Cardiologist:  Bettina Gavia Primary Electrophysiologist: Jolly Bleicher Meredith Leeds, MD    No chief complaint on file.    History of Present Illness: Jaime Scott is a 66 y.o. male who is being seen today for the evaluation of CHF at the request of Shirlee More. Presenting today for electrophysiology evaluation.  He has a history of congestive heart failure, chronic atrial fibrillation, COPD, alcohol abuse, hypertension, hyperlipidemia, coronary artery disease with ischemic cardiomyopathy, PVD, type 2 diabetes, and has a defibrillator in place.  CRT was attempted but LV lead was unable to be placed.     Today, he denies*** symptoms of palpitations, chest pain, shortness of breath, orthopnea, PND, lower extremity edema, claudication, dizziness, presyncope, syncope, bleeding, or neurologic sequela. The patient is tolerating medications without difficulties.    Past Medical History:  Diagnosis Date  . Alcohol abuse   . CHF (congestive heart failure) ([place]) 03/12/2017  . Chronic atrial fibrillation ([place]) 03/12/2017  . COPD (chronic obstructive pulmonary disease) ([place]) 03/12/2017  . Essential hypertension 03/12/2017  . Hyperlipidemia 03/12/2017  . Ischemic cardiomyopathy 03/12/2017  . Long term (current) use of anticoagulants 03/12/2017  . Peripheral vascular disease ([place]) 03/12/2017  . 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 Type 2 diabetes mellitus without complications ([place]) 0/17/5102   Past Surgical History:  Procedure Laterality Date  . CHOLECYSTECTOMY    . ERCP     stone removal  . EXCISION OF BREAST BIOPSY     Age 59, benign     Current Outpatient Medications  Medication Sig Dispense Refill  . carvedilol (COREG) 6.25 MG tablet Take 6.25 mg by mouth 2 (two) times daily with a  meal.    . digoxin (LANOXIN) 0.125 MG tablet Take 0.125 mg by mouth daily.    Marland Kitchen diltiazem (CARDIZEM SR) 120 MG 12 hr capsule Take 120 mg by mouth daily.    . furosemide (LASIX) 80 MG tablet Take 80 mg by mouth 2 (two) times daily.    Marland Kitchen lisinopril (PRINIVIL,ZESTRIL) 5 MG tablet Take 2.5 mg by mouth daily.    . metFORMIN (GLUCOPHAGE-XR) 500 MG 24 hr tablet Take 500 mg by mouth 2 (two) times daily.    . Oxycodone HCl 10 MG TABS Take 10 mg by mouth every 8 (eight) hours as needed (chronic pain).    . simvastatin (ZOCOR) 20 MG tablet Take 20 mg by mouth daily.    Marland Kitchen spironolactone (ALDACTONE) 25 MG tablet Take 1 tablet (25 mg total) by mouth daily. 90 tablet 3  . warfarin (COUMADIN) 1 MG tablet Take 1 mg by mouth daily.    Marland Kitchen warfarin (COUMADIN) 5 MG tablet Take 5 mg by mouth daily.     No current facility-administered medications for this visit.     Allergies:   Ivp dye [iodinated diagnostic agents]   Social History:  The patient  reports that he has been smoking cigarettes.  He has been smoking about 1.00 pack per day. he has never used smokeless tobacco. He reports that he does not drink alcohol or use drugs.   Family History:  The patient's family history includes Alcohol abuse in his brother and brother; Breast cancer in his sister.    ROS:  Please see the history of present illness.   Otherwise,  review of systems is positive for ***.   All other systems are reviewed and negative.    PHYSICAL EXAM: VS:  There were no vitals taken for this visit. , BMI There is no height or weight on file to calculate BMI. GEN: Well nourished, well developed, in no acute distress  HEENT: normal  Neck: no JVD, carotid bruits, or masses Cardiac: ***RRR; no murmurs, rubs, or gallops,no edema  Respiratory:  clear to auscultation bilaterally, normal work of breathing GI: soft, nontender, nondistended, + BS MS: no deformity or atrophy  Skin: warm and dry, device pocket is well healed Neuro:  Strength and  sensation are intact Psych: euthymic mood, full affect  EKG:  EKG {ACTION; IS/IS QMV:[id number]} ordered today. Personal review of the ekg ordered shows ***  Device interrogation is reviewed today in detail.  See PaceArt for details.   Recent Labs: 05/19/2017: BNP 208.3; BUN 5; Creatinine, Ser 0.60; Potassium 3.6; Sodium 139    Lipid Panel  No results found for: CHOL, TRIG, HDL, CHOLHDL, VLDL, LDLCALC, LDLDIRECT   Wt Readings from Last 3 Encounters:  05/05/17 215 lb 6.4 oz (97.7 kg)  03/13/17 211 lb 1.9 oz (95.8 kg)      Other studies Reviewed: Additional studies/ records that were reviewed today include: ***  Review of the above records today demonstrates: ***   ASSESSMENT AND PLAN:  1.  Dilated cardiomyopathy:***  2.  Hypertension:***  3.  Coronary artery disease:***  4.  Permanent atrial fibrillation:***  Current medicines are reviewed at length with the patient today.   The patient {ACTIONS; HAS/DOES NOT HAVE:19233} concerns regarding his medicines.  The following changes were made today:  {NONE DEFAULTED:18576::"none"}  Labs/ tests ordered today include: *** No orders of the defined types were placed in this encounter.    Disposition:   FU with Junice Fei {gen number [id number]} {Days to years:10300}  Signed, Nataliyah Packham Meredith Leeds, MD  07/15/2017 2:00 PM     [address] [place] [address] [phone] (office) [phone] (fax)

## 2017-07-30 DIAGNOSIS — Z79891 Long term (current) use of opiate analgesic: Secondary | ICD-10-CM | POA: Diagnosis not present

## 2017-07-30 DIAGNOSIS — M159 Polyosteoarthritis, unspecified: Secondary | ICD-10-CM | POA: Diagnosis not present

## 2017-07-30 DIAGNOSIS — Z720 Tobacco use: Secondary | ICD-10-CM | POA: Diagnosis not present

## 2017-07-30 DIAGNOSIS — M170 Bilateral primary osteoarthritis of knee: Secondary | ICD-10-CM | POA: Diagnosis not present

## 2017-07-30 DIAGNOSIS — M1612 Unilateral primary osteoarthritis, left hip: Secondary | ICD-10-CM | POA: Diagnosis not present

## 2017-07-30 DIAGNOSIS — J449 Chronic obstructive pulmonary disease, unspecified: Secondary | ICD-10-CM | POA: Diagnosis not present

## 2017-07-30 DIAGNOSIS — Z7901 Long term (current) use of anticoagulants: Secondary | ICD-10-CM | POA: Diagnosis not present

## 2017-07-30 DIAGNOSIS — M1611 Unilateral primary osteoarthritis, right hip: Secondary | ICD-10-CM | POA: Diagnosis not present

## 2017-07-30 DIAGNOSIS — G894 Chronic pain syndrome: Secondary | ICD-10-CM | POA: Diagnosis not present

## 2017-08-03 DIAGNOSIS — I4891 Unspecified atrial fibrillation: Secondary | ICD-10-CM | POA: Diagnosis not present

## 2017-08-03 DIAGNOSIS — Z7901 Long term (current) use of anticoagulants: Secondary | ICD-10-CM | POA: Diagnosis not present

## 2017-08-06 DIAGNOSIS — E113293 Type 2 diabetes mellitus with mild nonproliferative diabetic retinopathy without macular edema, bilateral: Secondary | ICD-10-CM | POA: Diagnosis not present

## 2017-08-06 DIAGNOSIS — H33302 Unspecified retinal break, left eye: Secondary | ICD-10-CM | POA: Diagnosis not present

## 2017-08-20 DIAGNOSIS — Z4502 Encounter for adjustment and management of automatic implantable cardiac defibrillator: Secondary | ICD-10-CM | POA: Diagnosis not present

## 2017-08-27 DIAGNOSIS — J449 Chronic obstructive pulmonary disease, unspecified: Secondary | ICD-10-CM | POA: Diagnosis not present

## 2017-08-27 DIAGNOSIS — M1611 Unilateral primary osteoarthritis, right hip: Secondary | ICD-10-CM | POA: Diagnosis not present

## 2017-08-27 DIAGNOSIS — Z7901 Long term (current) use of anticoagulants: Secondary | ICD-10-CM | POA: Diagnosis not present

## 2017-08-27 DIAGNOSIS — M1612 Unilateral primary osteoarthritis, left hip: Secondary | ICD-10-CM | POA: Diagnosis not present

## 2017-08-27 DIAGNOSIS — M159 Polyosteoarthritis, unspecified: Secondary | ICD-10-CM | POA: Diagnosis not present

## 2017-08-27 DIAGNOSIS — Z79891 Long term (current) use of opiate analgesic: Secondary | ICD-10-CM | POA: Diagnosis not present

## 2017-08-27 DIAGNOSIS — G894 Chronic pain syndrome: Secondary | ICD-10-CM | POA: Diagnosis not present

## 2017-08-27 DIAGNOSIS — M47816 Spondylosis without myelopathy or radiculopathy, lumbar region: Secondary | ICD-10-CM | POA: Diagnosis not present

## 2017-08-27 DIAGNOSIS — M170 Bilateral primary osteoarthritis of knee: Secondary | ICD-10-CM | POA: Diagnosis not present

## 2017-08-27 DIAGNOSIS — Z720 Tobacco use: Secondary | ICD-10-CM | POA: Diagnosis not present

## 2017-08-31 DIAGNOSIS — I10 Essential (primary) hypertension: Secondary | ICD-10-CM | POA: Diagnosis not present

## 2017-08-31 DIAGNOSIS — Z7901 Long term (current) use of anticoagulants: Secondary | ICD-10-CM | POA: Diagnosis not present

## 2017-08-31 DIAGNOSIS — E7800 Pure hypercholesterolemia, unspecified: Secondary | ICD-10-CM | POA: Diagnosis not present

## 2017-08-31 DIAGNOSIS — F17200 Nicotine dependence, unspecified, uncomplicated: Secondary | ICD-10-CM | POA: Diagnosis not present

## 2017-08-31 DIAGNOSIS — E119 Type 2 diabetes mellitus without complications: Secondary | ICD-10-CM | POA: Diagnosis not present

## 2017-08-31 DIAGNOSIS — I4891 Unspecified atrial fibrillation: Secondary | ICD-10-CM | POA: Diagnosis not present

## 2017-08-31 DIAGNOSIS — J449 Chronic obstructive pulmonary disease, unspecified: Secondary | ICD-10-CM | POA: Diagnosis not present

## 2017-09-01 NOTE — Progress Notes (Signed)
Cardiology Office Note:    Date:  09/02/2017   ID:  [name], DOB 09-11-1950, MRN [id number]  PCP:  Greig Right, MD  Cardiologist:  Shirlee More, MD    Referring MD: Greig Right, MD    ASSESSMENT:    1. Chronic systolic heart failure ([place])   2. Dilated cardiomyopathy ([place])   3. Hypertensive heart disease with heart failure ([place])   4. Chronic atrial fibrillation (HCC)   5. Long term (current) use of anticoagulants   6. High risk medication use   7. Presence of combination internal cardiac defibrillator (ICD) and pacemaker    PLAN:    In order of problems listed above:  1. Stable continue his current diuretic he has no fluid overload along with MRA digoxin carvedilol and transition from lisinopril to ARNI.  I will ask his PCP to recheck renal function BMP in about 2 weeks.  2. Plan to recheck an echo after optimization of medical therapy and if EF remains severely reduced consider optimizing his pacemaker 3. See discussions under #1 #2 4. Rate controlled continue beta-blocker and calcium channel blocker and anticoagulant managed by his PCP 5. Stable continue warfarin INR goal 2.5 6. Stable continue digoxin 7. Managed in our practice stable.   Next appointment: One month   Medication Adjustments/Labs and Tests Ordered: Current medicines are reviewed at length with the patient today.  Concerns regarding medicines are outlined above.  No orders of the defined types were placed in this encounter.  No orders of the defined types were placed in this encounter.   Chief Complaint  Patient presents with  . Follow-up    3 month flup appt     History of Present Illness:    YAN PANKRATZ is a 67 y.o. male with a hx of heart failure, LBBB, nonischemic cardiomyopathy and EF 25% MUGA 03/21/17 , ICD with failed attempt at placing left ventricular pacing lead for cardiac resynchronization. Implantable defibrillator in place with backup ventricular pacing available  if required, hypertension, and chronic atrial fibrillation last seen 4 months ago.   My note 05/16/17: ASSESSMENT:    1. Dilated cardiomyopathy ([place])   2. Chronic congestive heart failure, unspecified heart failure type ([place])   3. Hypertensive heart disease with heart failure ([place])   4. Long term (current) use of anticoagulants   5. Presence of combination internal cardiac defibrillator (ICD) and pacemaker   6. High risk medication use    PLAN:    In order of problems listed above:  1. Severely reduced ejection fraction, optimize treatment adding MRA and if his BNP was elevated transition to ARNI. If frequently paced in the right ventricle he would need His-Purkinje pacemaker or biventricular pacemaker with AV nodal ablation 2. Stable compensated, continue his current diuretc along with beta blocker Ace and  MRA. 3. Stable continue current treatment 4. Stable continue his current anticoagulant, warfarin INR goal 2.5 5. Stable he will transition to device care in my practice 6. Digoxin level in 2 weeks, goal less than 1.0 to avoid increased mortality with atrial fibrillation and heart failure  Compliance with diet, lifestyle and medications: Yes  He feels improved but still short of breath if he tries to walk outdoors such as walking in from the parking lot to the medical office building.  He has had no palpitation syncope chest pain TIA or complications of his medications.  Recent labs are reassuring reviewed from his PCP office.  He is recently switched to a short  acting form of calcium channel blocker.  After discussion of benefits agrees to transition from ACE inhibitor to ARNI and he will give thought to having optimization of his pacemaker either His-Purkinje her left ventricular lead with AV nodal ablation with A. fib left bundle branch block and severely reduced ejection fraction with heart failure. Past Medical History:  Diagnosis Date  . Alcohol abuse   . CHF (congestive  heart failure) ([place]) 03/12/2017  . Chronic atrial fibrillation ([place]) 03/12/2017  . COPD (chronic obstructive pulmonary disease) ([place]) 03/12/2017  . Essential hypertension 03/12/2017  . Hyperlipidemia 03/12/2017  . Ischemic cardiomyopathy 03/12/2017  . Long term (current) use of anticoagulants 03/12/2017  . Peripheral vascular disease ([place]) 03/12/2017  . 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 Type 2 diabetes mellitus without complications ([place]) 9/62/9528    Past Surgical History:  Procedure Laterality Date  . CHOLECYSTECTOMY    . ERCP     stone removal  . EXCISION OF BREAST BIOPSY     Age 55, benign    Current Medications: Current Meds  Medication Sig  . carvedilol (COREG) 6.25 MG tablet Take 6.25 mg by mouth 2 (two) times daily with a meal.  . digoxin (LANOXIN) 0.125 MG tablet Take 0.125 mg by mouth daily.  Marland Kitchen diltiazem (CARDIZEM SR) 120 MG 12 hr capsule Take 120 mg by mouth 2 (two) times daily.   . furosemide (LASIX) 80 MG tablet Take 80 mg by mouth 2 (two) times daily.  Marland Kitchen lisinopril (PRINIVIL,ZESTRIL) 5 MG tablet Take 2.5 mg by mouth daily.  . metFORMIN (GLUCOPHAGE-XR) 500 MG 24 hr tablet Take 500 mg by mouth 2 (two) times daily.  . Oxycodone HCl 10 MG TABS Take 10 mg by mouth every 8 (eight) hours as needed (chronic pain).  . simvastatin (ZOCOR) 20 MG tablet Take 20 mg by mouth daily.  Marland Kitchen warfarin (COUMADIN) 1 MG tablet Take 1 mg by mouth daily.  Marland Kitchen warfarin (COUMADIN) 5 MG tablet Take 5 mg by mouth daily.     Allergies:   Ivp dye [iodinated diagnostic agents]   Social History   Socioeconomic History  . Marital status: Divorced    Spouse name: None  . Number of children: None  . Years of education: None  . Highest education level: None  Social Needs  . Financial resource strain: None  . Food insecurity - worry: None  . Food insecurity - inability: None  . Transportation needs - medical: None  .  Transportation needs - non-medical: None  Occupational History  . None  Tobacco Use  . Smoking status: Current Every Day Smoker    Packs/day: 1.00    Types: Cigarettes  . Smokeless tobacco: Never Used  Substance and Sexual Activity  . Alcohol use: No  . Drug use: No  . Sexual activity: None  Other Topics Concern  . None  Social History Narrative  . None     Family History: The patient's family history includes Alcohol abuse in his brother and brother; Breast cancer in his sister. ROS:   Please see the history of present illness.    All other systems reviewed and are negative.  EKGs/Labs/Other Studies Reviewed:    The following studies were reviewed today:   Device check 05/20/17:  Patient Name: SUYASH AMORY, [birth year], 67 y.o. Following Provider: Adrian Prows, MD Manufacturer of Device: Medtronic Type of Device: CRT-D / BiVentricular Defribrillator See scanned/downloaded PDF report  for the model numbers, serial numbers, and date of implant. Presenting Rhythm: VS 80 [Programmed VVI] Percentage RV / BiV Pacing: 0.6%RV Pacing Device Findings: Scheduled test; V-Lead OK [A-lead and LV lead both >3000ohms]; No meds listed; 1 NVST for 2 sec at 207 bpm  09/01/17:  CBC normal INR 2.5 BMP normal K 3.9, GFR > 60, digoxin 0.9 Recent Labs:   Physical Exam:    VS:  BP 96/64 (BP Location: Right Arm, Patient Position: Sitting, Cuff Size: Large)   Pulse 86   Resp (!) 96   Ht 5\' 7"  (1.702 m)   Wt 221 lb 1.9 oz (100.3 kg)   BMI 34.63 kg/m     Wt Readings from Last 3 Encounters:  09/02/17 221 lb 1.9 oz (100.3 kg)  05/05/17 215 lb 6.4 oz (97.7 kg)  03/13/17 211 lb 1.9 oz (95.8 kg)     GEN:  Well nourished, well developed in no acute distress HEENT: Normal NECK: No JVD; No carotid bruits LYMPHATICS: No lymphadenopathy CARDIAC: Irregular irregular variable first heart sound no S3  RESPIRATORY:  Clear to auscultation without rales, wheezing or rhonchi  ABDOMEN: Soft,  non-tender, non-distended MUSCULOSKELETAL:  No edema; No deformity  SKIN: Warm and dry NEUROLOGIC:  Alert and oriented x 3 PSYCHIATRIC:  Normal affect    Signed, Shirlee More, MD  09/02/2017 11:01 AM    [place]

## 2017-09-02 ENCOUNTER — Ambulatory Visit (INDEPENDENT_AMBULATORY_CARE_PROVIDER_SITE_OTHER): Payer: Medicare Other | Admitting: Cardiology

## 2017-09-02 ENCOUNTER — Encounter: Payer: Self-pay | Admitting: Cardiology

## 2017-09-02 VITALS — BP 96/64 | HR 86 | Resp 96 | Ht 67.0 in | Wt 221.1 lb

## 2017-09-02 DIAGNOSIS — Z79899 Other long term (current) drug therapy: Secondary | ICD-10-CM | POA: Diagnosis not present

## 2017-09-02 DIAGNOSIS — I110 Hypertensive heart disease with heart failure: Secondary | ICD-10-CM | POA: Diagnosis not present

## 2017-09-02 DIAGNOSIS — I482 Chronic atrial fibrillation, unspecified: Secondary | ICD-10-CM

## 2017-09-02 DIAGNOSIS — I420 Dilated cardiomyopathy: Secondary | ICD-10-CM

## 2017-09-02 DIAGNOSIS — Z95810 Presence of automatic (implantable) cardiac defibrillator: Secondary | ICD-10-CM | POA: Diagnosis not present

## 2017-09-02 DIAGNOSIS — I5022 Chronic systolic (congestive) heart failure: Secondary | ICD-10-CM

## 2017-09-02 DIAGNOSIS — Z7901 Long term (current) use of anticoagulants: Secondary | ICD-10-CM | POA: Diagnosis not present

## 2017-09-02 MED ORDER — SACUBITRIL-VALSARTAN 24-26 MG PO TABS: 1.0000 | ORAL_TABLET | Freq: Two times a day (BID) | ORAL | 1 refills | Status: DC

## 2017-09-02 NOTE — Patient Instructions (Addendum)
Medication Instructions:  Your physician has recommended you make the following change in your medication:  START entresto 24/26 mg twice daily STOP Lisinopril (this medication must be stopped for 3 days before entresto may be started)  Labwork: None  Testing/Procedures: None  Follow-Up: Your physician recommends that you schedule a follow-up appointment in: 1 month  Any Other Special Instructions Will Be Listed Below (If Applicable).     If you need a refill on your cardiac medications before your next appointment, please call your pharmacy.   [place], RN, BSN  Heart Failure  Weigh yourself every morning when you first wake up and record on a calender or note pad, bring this to your office visits. Using a pill tender can help with taking your medications consistently.  Limit your fluid intake to 2 liters daily  Limit your sodium intake to less than 2-3 grams daily. Ask if you need dietary teaching.  If you gain more than 3 pounds (from your dry weight ), double your dose of diuretic for the day.  If you gain more than 5 pounds (from your dry weight), double your dose of lasix and call your heart failure doctor.  Please do not smoke tobacco since it is very bad for your heart.  Please do not drink alcohol since it can worsen your heart failure.Also avoid OTC nonsteroidal drugs, such as advil, aleve and motrin.  Try to exercise for at least 30 minutes every day because this will help your heart be more efficient. You may be eligible for supervised cardiac rehab, ask your physician.   Sacubitril; Valsartan oral tablet What is this medicine? SACUBITRIL; VALSARTAN (sak UE bi tril; val SAR tan) is a combination of 2 drugs used to reduce the risk of death and hospitalizations in people with long-lasting heart failure. It is usually used with other medicines to treat heart failure. This medicine may be used for other purposes; ask your health care provider  or pharmacist if you have questions. COMMON BRAND NAME(S): Entresto What should I tell my health care provider before I take this medicine? They need to know if you have any of these conditions: -diabetes and take a medicine that contains aliskiren -kidney disease -liver disease -an unusual or allergic reaction to sacubitril; valsartan, drugs called angiotensin converting enzyme (ACE) inhibitors, angiotensin II receptor blockers (ARBs), other medicines, foods, dyes, or preservatives -pregnant or trying to get pregnant -breast-feeding How should I use this medicine? Take this medicine by mouth with a glass of water. Follow the directions on the prescription label. You can take it with or without food. If it upsets your stomach, take it with food. Take your medicine at regular intervals. Do not take it more often than directed. Do not stop taking except on your doctor's advice. Do not take this medicine for at least 36 hours before or after you take an ACE inhibitor medicine. Talk to your health care provider if you are not sure if you take an ACE inhibitor. Talk to your pediatrician regarding the use of this medicine in children. Special care may be needed. Overdosage: If you think you have taken too much of this medicine contact a poison control center or emergency room at once. NOTE: This medicine is only for you. Do not share this medicine with others. What if I miss a dose? If you miss a dose, take it as soon as you can. If it is almost time for next dose, take only that  dose. Do not take double or extra doses. What may interact with this medicine? Do not take this medicine with any of the following medicines: -aliskiren if you have diabetes -angiotensin-converting enzyme (ACE) inhibitors, like benazepril, captopril, enalapril, fosinopril, lisinopril, or ramipril This medicine may also interact with the following medicines: -angiotensin II receptor blockers (ARBs) like azilsartan,  candesartan, eprosartan, irbesartan, losartan, olmesartan, telmisartan, or valsartan -lithium -NSAIDS, medicines for pain and inflammation, like ibuprofen or naproxen -potassium-sparing diuretics like amiloride, spironolactone, and triamterene -potassium supplements This list may not describe all possible interactions. Give your health care provider a list of all the medicines, herbs, non-prescription drugs, or dietary supplements you use. Also tell them if you smoke, drink alcohol, or use illegal drugs. Some items may interact with your medicine. What should I watch for while using this medicine? Tell your doctor or healthcare professional if your symptoms do not start to get better or if they get worse. Do not become pregnant while taking this medicine. Women should inform their doctor if they wish to become pregnant or think they might be pregnant. There is a potential for serious side effects to an unborn child. Talk to your health care professional or pharmacist for more information. You may get dizzy. Do not drive, use machinery, or do anything that needs mental alertness until you know how this medicine affects you. Do not stand or sit up quickly, especially if you are an older patient. This reduces the risk of dizzy or fainting spells. Avoid alcoholic drinks; they can make you more dizzy. What side effects may I notice from receiving this medicine? Side effects that you should report to your doctor or health care professional as soon as possible: -allergic reactions like skin rash, itching or hives, swelling of the face, lips, or tongue -signs and symptoms of increased potassium like muscle weakness; chest pain; or fast, irregular heartbeat -signs and symptoms of kidney injury like trouble passing urine or change in the amount of urine -signs and symptoms of low blood pressure like feeling dizzy or lightheaded, or if you develop extreme fatigue Side effects that usually do not require medical  attention (report to your doctor or health care professional if they continue or are bothersome): -cough This list may not describe all possible side effects. Call your doctor for medical advice about side effects. You may report side effects to FDA at 1-800-FDA-1088. Where should I keep my medicine? Keep out of the reach of children. Store at room temperature between 15 and 30 degrees C (59 and 86 degrees F). Throw away any unused medicine after the expiration date. NOTE: This sheet is a summary. It may not cover all possible information. If you have questions about this medicine, talk to your doctor, pharmacist, or health care provider.  2018 Elsevier/Gold Standard (2015-08-29 13:54:19)

## 2017-09-04 ENCOUNTER — Telehealth: Payer: Self-pay | Admitting: Cardiology

## 2017-09-04 NOTE — Telephone Encounter (Signed)
Patient asked for you to call him back. No other info was given

## 2017-09-04 NOTE — Telephone Encounter (Signed)
Patient called to inform that the [employer] is going to pay for [name] until July, 2020. Patient states they will be faxing something to our office soon. No further questions.

## 2017-09-04 NOTE — Telephone Encounter (Signed)
Patient advised to contact the [facility] for patient assistance. Phone number for [employer] given to the patient. Also mailing a free 30 day trail offer voucher to patient's home address. Advised patient to contact the office with any further issues.

## 2017-09-04 NOTE — Telephone Encounter (Signed)
Patient states that he went to pick up Jaime Scott and he is unable to afford it. Please can you call him with possible options because he states that he would like to take medicine.

## 2017-09-14 ENCOUNTER — Telehealth: Payer: Self-pay | Admitting: Cardiology

## 2017-09-14 NOTE — Telephone Encounter (Signed)
Patient returned your call, did not say about what.

## 2017-09-14 NOTE — Telephone Encounter (Signed)
Mailing 1 month free supply of Entresto copay card to patient's PO [address].

## 2017-09-21 DIAGNOSIS — M1611 Unilateral primary osteoarthritis, right hip: Secondary | ICD-10-CM | POA: Diagnosis not present

## 2017-09-21 DIAGNOSIS — M47816 Spondylosis without myelopathy or radiculopathy, lumbar region: Secondary | ICD-10-CM | POA: Diagnosis not present

## 2017-09-21 DIAGNOSIS — M1612 Unilateral primary osteoarthritis, left hip: Secondary | ICD-10-CM | POA: Diagnosis not present

## 2017-09-23 DIAGNOSIS — M1612 Unilateral primary osteoarthritis, left hip: Secondary | ICD-10-CM | POA: Diagnosis not present

## 2017-09-23 DIAGNOSIS — M159 Polyosteoarthritis, unspecified: Secondary | ICD-10-CM | POA: Diagnosis not present

## 2017-09-23 DIAGNOSIS — I509 Heart failure, unspecified: Secondary | ICD-10-CM | POA: Diagnosis not present

## 2017-09-23 DIAGNOSIS — M47814 Spondylosis without myelopathy or radiculopathy, thoracic region: Secondary | ICD-10-CM | POA: Diagnosis not present

## 2017-09-23 DIAGNOSIS — M170 Bilateral primary osteoarthritis of knee: Secondary | ICD-10-CM | POA: Diagnosis not present

## 2017-09-23 DIAGNOSIS — Z79891 Long term (current) use of opiate analgesic: Secondary | ICD-10-CM | POA: Diagnosis not present

## 2017-09-23 DIAGNOSIS — G894 Chronic pain syndrome: Secondary | ICD-10-CM | POA: Diagnosis not present

## 2017-09-23 DIAGNOSIS — M1611 Unilateral primary osteoarthritis, right hip: Secondary | ICD-10-CM | POA: Diagnosis not present

## 2017-09-23 DIAGNOSIS — Z7901 Long term (current) use of anticoagulants: Secondary | ICD-10-CM | POA: Diagnosis not present

## 2017-09-23 DIAGNOSIS — J449 Chronic obstructive pulmonary disease, unspecified: Secondary | ICD-10-CM | POA: Diagnosis not present

## 2017-09-23 DIAGNOSIS — Z720 Tobacco use: Secondary | ICD-10-CM | POA: Diagnosis not present

## 2017-09-23 DIAGNOSIS — M47816 Spondylosis without myelopathy or radiculopathy, lumbar region: Secondary | ICD-10-CM | POA: Diagnosis not present

## 2017-09-28 NOTE — Progress Notes (Signed)
Cardiology Office Note:    Date:  09/30/2017   ID:  Jaime Scott, DOB Feb 11, 1951, MRN [id number]  PCP:  Greig Right, MD  Cardiologist:  Shirlee More, MD    Referring MD: Greig Right, MD    ASSESSMENT:    1. Chronic systolic heart failure ([place])   2. High risk medication use   3. Dilated cardiomyopathy ([place])   4. Chronic atrial fibrillation (HCC)   5. Long term (current) use of anticoagulants    PLAN:    In order of problems listed above:  1. Stable he is tolerating ARNI and has continued shortness of breath walking approximately 1-200 foot but no edema orthopnea chest pain palpitation or syncope.  His blood pressure is 3762 systolic I will not try to uptitrate and is having gynecomastia and I have asked him to stop his MRA. 2. Stable no evidence of toxicity level is correct less than 1 to avoid increased mortality when used with heart failure 3. Stable continue guideline directed medical therapy and plan after next office visit to recheck ejection fraction 4. Stable continue warfarin goal INR 2.0-2.5 and digoxin beta-blocker for rate control 5. Continue warfarin 6. ICD stable he is still followed in my previous practice and return to [facility] device clinic   Next appointment: July 2019   Medication Adjustments/Labs and Tests Ordered: Current medicines are reviewed at length with the patient today.  Concerns regarding medicines are outlined above.  No orders of the defined types were placed in this encounter.  No orders of the defined types were placed in this encounter.   Chief Complaint  Patient presents with  . Follow-up    1 month flup appt     History of Present Illness:    Jaime Scott is a 67 y.o. male with a hx of heart failure, LBBB, nonischemic cardiomyopathy and EF 25% MUGA 03/21/17 , ICD with failed attempt at placing left ventricular pacing lead for cardiac resynchronization. Implantable defibrillator in place with backup  ventricular pacing available if required, hypertension, and chronic atrial fibrillation  last seen 02/06/19n and transitioned from ACEI to ARNI.  ASSESSMENT: 09/02/17   1. Chronic systolic heart failure ([place])   2. Dilated cardiomyopathy ([place])   3. Hypertensive heart disease with heart failure ([place])   4. Chronic atrial fibrillation (HCC)   5. Long term (current) use of anticoagulants   6. High risk medication use   7. Presence of combination internal cardiac defibrillator (ICD) and pacemaker    PLAN:    1. Stable continue his current diuretic he has no fluid overload along with MRA digoxin carvedilol and transition from lisinopril to ARNI.  I will ask his PCP to recheck renal function BMP in about 2 weeks.  2. Plan to recheck an echo after optimization of medical therapy and if EF remains severely reduced consider optimizing his pacemaker 3. See discussions under #1 #2 4. Rate controlled continue beta-blocker and calcium channel blocker and anticoagulant managed by his PCP 5. Stable continue warfarin INR goal 2.5 6. Stable continue digoxin 7. Managed in our practice stable.     Compliance with diet, lifestyle and medications: yes Past Medical History:  Diagnosis Date  . Alcohol abuse   . CHF (congestive heart failure) ([place]) 03/12/2017  . Chronic atrial fibrillation ([place]) 03/12/2017  . COPD (chronic obstructive pulmonary disease) ([place]) 03/12/2017  . Essential hypertension 03/12/2017  . Hyperlipidemia 03/12/2017  . Ischemic cardiomyopathy 03/12/2017  . Long term (current) use of anticoagulants  03/12/2017  . Peripheral vascular disease ([place]) 03/12/2017  . 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 Type 2 diabetes mellitus without complications ([place]) 5/40/9811    Past Surgical History:  Procedure Laterality Date  . CHOLECYSTECTOMY    . ERCP     stone removal  . EXCISION OF BREAST BIOPSY     Age 24, benign     Current Medications: Current Meds  Medication Sig  . carvedilol (COREG) 6.25 MG tablet Take 6.25 mg by mouth 2 (two) times daily with a meal.  . digoxin (LANOXIN) 0.125 MG tablet Take 0.125 mg by mouth daily.  Marland Kitchen diltiazem (CARDIZEM) 30 MG tablet Take 30 mg by mouth 2 (two) times daily.  . furosemide (LASIX) 80 MG tablet Take 80 mg by mouth 2 (two) times daily.  . metFORMIN (GLUCOPHAGE-XR) 500 MG 24 hr tablet Take 500 mg by mouth 2 (two) times daily.  . Oxycodone HCl 10 MG TABS Take 10 mg by mouth every 8 (eight) hours as needed (chronic pain).  . sacubitril-valsartan (ENTRESTO) 24-26 MG Take 1 tablet by mouth 2 (two) times daily.  . simvastatin (ZOCOR) 20 MG tablet Take 20 mg by mouth daily.  Marland Kitchen warfarin (COUMADIN) 1 MG tablet Take 1 mg by mouth daily.  Marland Kitchen warfarin (COUMADIN) 5 MG tablet Take 5 mg by mouth daily.  . [DISCONTINUED] spironolactone (ALDACTONE) 25 MG tablet Take 1 tablet (25 mg total) by mouth daily.     Allergies:   Ivp dye [iodinated diagnostic agents]   Social History   Socioeconomic History  . Marital status: Divorced    Spouse name: None  . Number of children: None  . Years of education: None  . Highest education level: None  Social Needs  . Financial resource strain: None  . Food insecurity - worry: None  . Food insecurity - inability: None  . Transportation needs - medical: None  . Transportation needs - non-medical: None  Occupational History  . None  Tobacco Use  . Smoking status: Current Every Day Smoker    Packs/day: 1.00    Types: Cigarettes  . Smokeless tobacco: Never Used  Substance and Sexual Activity  . Alcohol use: No  . Drug use: No  . Sexual activity: None  Other Topics Concern  . None  Social History Narrative  . None     Family History: The patient's family history includes Alcohol abuse in his brother and brother; Breast cancer in his sister. ROS:   Please see the history of present illness.    All other systems reviewed and  are negative.  EKGs/Labs/Other Studies Reviewed:    The following studies were reviewed today  Recent Labs: Labs 09/01/17 CBC BMP normal K 3.9 INR 2.5 Digoxin 0.8 05/19/2017: BNP 208.3; BUN 5; Creatinine, Ser 0.60; Potassium 3.6; Sodium 139   Physical Exam:    VS:  BP 92/62 (BP Location: Right Arm, Patient Position: Sitting, Cuff Size: Large)   Pulse 91   Ht 5\' 7"  (1.702 m)   Wt 225 lb (102.1 kg)   SpO2 96%   BMI 35.24 kg/m     Wt Readings from Last 3 Encounters:  09/30/17 225 lb (102.1 kg)  09/02/17 221 lb 1.9 oz (100.3 kg)  05/05/17 215 lb 6.4 oz (97.7 kg)     GEN:  Well nourished, well developed in no acute distress HEENT: Normal NECK: No JVD; No carotid bruits LYMPHATICS: No lymphadenopathy  CARDIAC: Irregular rhythm variable first heart sound no murmurs, rubs, gallops RESPIRATORY:  Clear to auscultation without rales, wheezing or rhonchi  ABDOMEN: Soft, non-tender, non-distended MUSCULOSKELETAL:  No edema; No deformity  SKIN: Warm and dry NEUROLOGIC:  Alert and oriented x 3 PSYCHIATRIC:  Normal affect    Signed, Shirlee More, MD  09/30/2017 12:57 PM    [facility]

## 2017-09-30 ENCOUNTER — Encounter: Payer: Self-pay | Admitting: Cardiology

## 2017-09-30 ENCOUNTER — Ambulatory Visit (INDEPENDENT_AMBULATORY_CARE_PROVIDER_SITE_OTHER): Payer: Medicare Other | Admitting: Cardiology

## 2017-09-30 VITALS — BP 92/62 | HR 91 | Ht 67.0 in | Wt 225.0 lb

## 2017-09-30 DIAGNOSIS — Z7901 Long term (current) use of anticoagulants: Secondary | ICD-10-CM

## 2017-09-30 DIAGNOSIS — I5022 Chronic systolic (congestive) heart failure: Secondary | ICD-10-CM

## 2017-09-30 DIAGNOSIS — I482 Chronic atrial fibrillation, unspecified: Secondary | ICD-10-CM

## 2017-09-30 DIAGNOSIS — Z79899 Other long term (current) drug therapy: Secondary | ICD-10-CM

## 2017-09-30 DIAGNOSIS — I420 Dilated cardiomyopathy: Secondary | ICD-10-CM

## 2017-09-30 NOTE — Patient Instructions (Addendum)
Medication Instructions:  Your physician has recommended you make the following change in your medication:  STOP spironolactone  Labwork: None  Testing/Procedures: None  Follow-Up: Your physician wants you to follow-up in: 4 months. You will receive a reminder letter in the mail two months in advance. If you don't receive a letter, please call our office to schedule the follow-up appointment.   Any Other Special Instructions Will Be Listed Below (If Applicable).     If you need a refill on your cardiac medications before your next appointment, please call your pharmacy.    Heart Failure  Weigh yourself every morning when you first wake up and record on a calender or note pad, bring this to your office visits. Using a pill tender can help with taking your medications consistently.  Limit your fluid intake to 2 liters daily  Limit your sodium intake to less than 2-3 grams daily. Ask if you need dietary teaching.  If you gain more than 3 pounds (from your dry weight ), double your dose of diuretic for the day.  If you gain more than 5 pounds (from your dry weight), double your dose of lasix and call your heart failure doctor.  Please do not smoke tobacco since it is very bad for your heart.  Please do not drink alcohol since it can worsen your heart failure.Also avoid OTC nonsteroidal drugs, such as advil, aleve and motrin.  Try to exercise for at least 30 minutes every day because this will help your heart be more efficient. You may be eligible for supervised cardiac rehab, ask your physician.

## 2017-10-07 DIAGNOSIS — I4891 Unspecified atrial fibrillation: Secondary | ICD-10-CM | POA: Diagnosis not present

## 2017-10-08 DIAGNOSIS — Z7901 Long term (current) use of anticoagulants: Secondary | ICD-10-CM | POA: Diagnosis not present

## 2017-10-21 DIAGNOSIS — M159 Polyosteoarthritis, unspecified: Secondary | ICD-10-CM | POA: Diagnosis not present

## 2017-10-21 DIAGNOSIS — M1611 Unilateral primary osteoarthritis, right hip: Secondary | ICD-10-CM | POA: Diagnosis not present

## 2017-10-21 DIAGNOSIS — G894 Chronic pain syndrome: Secondary | ICD-10-CM | POA: Diagnosis not present

## 2017-10-21 DIAGNOSIS — M1612 Unilateral primary osteoarthritis, left hip: Secondary | ICD-10-CM | POA: Diagnosis not present

## 2017-10-21 DIAGNOSIS — Z79891 Long term (current) use of opiate analgesic: Secondary | ICD-10-CM | POA: Diagnosis not present

## 2017-10-21 DIAGNOSIS — M47816 Spondylosis without myelopathy or radiculopathy, lumbar region: Secondary | ICD-10-CM | POA: Diagnosis not present

## 2017-10-21 DIAGNOSIS — M170 Bilateral primary osteoarthritis of knee: Secondary | ICD-10-CM | POA: Diagnosis not present

## 2017-10-21 DIAGNOSIS — I509 Heart failure, unspecified: Secondary | ICD-10-CM | POA: Diagnosis not present

## 2017-10-21 DIAGNOSIS — Z720 Tobacco use: Secondary | ICD-10-CM | POA: Diagnosis not present

## 2017-10-21 DIAGNOSIS — J449 Chronic obstructive pulmonary disease, unspecified: Secondary | ICD-10-CM | POA: Diagnosis not present

## 2017-10-21 DIAGNOSIS — M47814 Spondylosis without myelopathy or radiculopathy, thoracic region: Secondary | ICD-10-CM | POA: Diagnosis not present

## 2017-10-21 DIAGNOSIS — Z7901 Long term (current) use of anticoagulants: Secondary | ICD-10-CM | POA: Diagnosis not present

## 2017-11-16 ENCOUNTER — Telehealth: Payer: Self-pay | Admitting: Cardiology

## 2017-11-16 MED ORDER — CANDESARTAN CILEXETIL 8 MG PO TABS: 8.0000 mg | ORAL_TABLET | Freq: Every day | ORAL | 3 refills | Status: DC

## 2017-11-16 MED ORDER — ENALAPRIL MALEATE 5 MG PO TABS: 5.0000 mg | ORAL_TABLET | Freq: Every day | ORAL | 3 refills | Status: DC

## 2017-11-16 NOTE — Telephone Encounter (Signed)
Has no way to get entresto anymore

## 2017-11-16 NOTE — Telephone Encounter (Signed)
Can use Atacand 8 mg day and no need for a washout period after stopping his entresto

## 2017-11-16 NOTE — Telephone Encounter (Signed)
Patient advised to finish the [facility] that he has, then start candesartan (Atacand) 8 mg daily. Patient verbalized understanding to not take Entresto and candesartan the day, will start candesartan the next day. Sent to [facility] in [place] per request. Patient verbalized understanding. No further questions.

## 2017-11-16 NOTE — Telephone Encounter (Signed)
Wait three full days after stop entresto, then enalapril 5 mg BID

## 2017-11-16 NOTE — Telephone Encounter (Signed)
Patient states the pharmacy just called him and advised that candesartan is $108. Patient cannot afford that copay. Please advise on alternative.

## 2017-11-16 NOTE — Telephone Encounter (Signed)
Patient advised to start enalapril 5 mg twice daily. Patient will not start candesartan due to cost. Patient verbalized understanding to wait three full days to start enalapril after stopping Entresto. Patient advised this can cause an interaction. No further questions.

## 2017-11-16 NOTE — Telephone Encounter (Signed)
Patient states that he misunderstood the [place]. He thought Delene Loll was approved through July, 2020, but was actually only approved for 2 months. Patient states that last month he had to pay $51 copay for [facility] and he cannot afford to do that every month. Patient states the medication has not made him feel any better. Patient states that he has about 4 days left of Entresto.  Please advise.

## 2017-11-16 NOTE — Addendum Note (Signed)
Addended by: Jossie Ng on: 11/16/2017 04:42 PM   Modules accepted: Orders

## 2017-11-16 NOTE — Addendum Note (Signed)
Addended by: Warner Mccreedy E on: 11/16/2017 03:03 PM   Modules accepted: Orders

## 2017-11-18 DIAGNOSIS — G894 Chronic pain syndrome: Secondary | ICD-10-CM | POA: Diagnosis not present

## 2017-11-18 DIAGNOSIS — I509 Heart failure, unspecified: Secondary | ICD-10-CM | POA: Diagnosis not present

## 2017-11-18 DIAGNOSIS — M170 Bilateral primary osteoarthritis of knee: Secondary | ICD-10-CM | POA: Diagnosis not present

## 2017-11-18 DIAGNOSIS — M1611 Unilateral primary osteoarthritis, right hip: Secondary | ICD-10-CM | POA: Diagnosis not present

## 2017-11-18 DIAGNOSIS — M47814 Spondylosis without myelopathy or radiculopathy, thoracic region: Secondary | ICD-10-CM | POA: Diagnosis not present

## 2017-11-18 DIAGNOSIS — Z79891 Long term (current) use of opiate analgesic: Secondary | ICD-10-CM | POA: Diagnosis not present

## 2017-11-18 DIAGNOSIS — Z720 Tobacco use: Secondary | ICD-10-CM | POA: Diagnosis not present

## 2017-11-18 DIAGNOSIS — M47816 Spondylosis without myelopathy or radiculopathy, lumbar region: Secondary | ICD-10-CM | POA: Diagnosis not present

## 2017-11-18 DIAGNOSIS — J449 Chronic obstructive pulmonary disease, unspecified: Secondary | ICD-10-CM | POA: Diagnosis not present

## 2017-11-18 DIAGNOSIS — M159 Polyosteoarthritis, unspecified: Secondary | ICD-10-CM | POA: Diagnosis not present

## 2017-11-18 DIAGNOSIS — M1612 Unilateral primary osteoarthritis, left hip: Secondary | ICD-10-CM | POA: Diagnosis not present

## 2017-11-18 DIAGNOSIS — Z7901 Long term (current) use of anticoagulants: Secondary | ICD-10-CM | POA: Diagnosis not present

## 2017-11-27 DIAGNOSIS — I4891 Unspecified atrial fibrillation: Secondary | ICD-10-CM | POA: Diagnosis not present

## 2017-11-30 DIAGNOSIS — Z7901 Long term (current) use of anticoagulants: Secondary | ICD-10-CM | POA: Diagnosis not present

## 2017-12-03 DIAGNOSIS — Z0000 Encounter for general adult medical examination without abnormal findings: Secondary | ICD-10-CM | POA: Diagnosis not present

## 2017-12-03 DIAGNOSIS — E7800 Pure hypercholesterolemia, unspecified: Secondary | ICD-10-CM | POA: Diagnosis not present

## 2017-12-03 DIAGNOSIS — I4891 Unspecified atrial fibrillation: Secondary | ICD-10-CM | POA: Diagnosis not present

## 2017-12-03 DIAGNOSIS — F17200 Nicotine dependence, unspecified, uncomplicated: Secondary | ICD-10-CM | POA: Diagnosis not present

## 2017-12-03 DIAGNOSIS — I10 Essential (primary) hypertension: Secondary | ICD-10-CM | POA: Diagnosis not present

## 2017-12-03 DIAGNOSIS — Z7901 Long term (current) use of anticoagulants: Secondary | ICD-10-CM | POA: Diagnosis not present

## 2017-12-03 DIAGNOSIS — E669 Obesity, unspecified: Secondary | ICD-10-CM | POA: Diagnosis not present

## 2017-12-03 DIAGNOSIS — E119 Type 2 diabetes mellitus without complications: Secondary | ICD-10-CM | POA: Diagnosis not present

## 2017-12-03 DIAGNOSIS — Z1211 Encounter for screening for malignant neoplasm of colon: Secondary | ICD-10-CM | POA: Diagnosis not present

## 2017-12-03 DIAGNOSIS — Z79899 Other long term (current) drug therapy: Secondary | ICD-10-CM | POA: Diagnosis not present

## 2017-12-16 DIAGNOSIS — G894 Chronic pain syndrome: Secondary | ICD-10-CM | POA: Diagnosis not present

## 2017-12-16 DIAGNOSIS — M47814 Spondylosis without myelopathy or radiculopathy, thoracic region: Secondary | ICD-10-CM | POA: Diagnosis not present

## 2017-12-16 DIAGNOSIS — Z7901 Long term (current) use of anticoagulants: Secondary | ICD-10-CM | POA: Diagnosis not present

## 2017-12-16 DIAGNOSIS — M170 Bilateral primary osteoarthritis of knee: Secondary | ICD-10-CM | POA: Diagnosis not present

## 2017-12-16 DIAGNOSIS — I509 Heart failure, unspecified: Secondary | ICD-10-CM | POA: Diagnosis not present

## 2017-12-16 DIAGNOSIS — M1611 Unilateral primary osteoarthritis, right hip: Secondary | ICD-10-CM | POA: Diagnosis not present

## 2017-12-16 DIAGNOSIS — J449 Chronic obstructive pulmonary disease, unspecified: Secondary | ICD-10-CM | POA: Diagnosis not present

## 2017-12-16 DIAGNOSIS — M47816 Spondylosis without myelopathy or radiculopathy, lumbar region: Secondary | ICD-10-CM | POA: Diagnosis not present

## 2017-12-16 DIAGNOSIS — M159 Polyosteoarthritis, unspecified: Secondary | ICD-10-CM | POA: Diagnosis not present

## 2017-12-16 DIAGNOSIS — M1612 Unilateral primary osteoarthritis, left hip: Secondary | ICD-10-CM | POA: Diagnosis not present

## 2017-12-16 DIAGNOSIS — Z720 Tobacco use: Secondary | ICD-10-CM | POA: Diagnosis not present

## 2017-12-16 DIAGNOSIS — Z79891 Long term (current) use of opiate analgesic: Secondary | ICD-10-CM | POA: Diagnosis not present

## 2017-12-28 DIAGNOSIS — I4891 Unspecified atrial fibrillation: Secondary | ICD-10-CM | POA: Diagnosis not present

## 2017-12-29 DIAGNOSIS — E876 Hypokalemia: Secondary | ICD-10-CM | POA: Diagnosis not present

## 2017-12-29 DIAGNOSIS — Z7901 Long term (current) use of anticoagulants: Secondary | ICD-10-CM | POA: Diagnosis not present

## 2017-12-29 DIAGNOSIS — Z79899 Other long term (current) drug therapy: Secondary | ICD-10-CM | POA: Diagnosis not present

## 2018-01-11 DIAGNOSIS — I4891 Unspecified atrial fibrillation: Secondary | ICD-10-CM | POA: Diagnosis not present

## 2018-01-11 DIAGNOSIS — Z7901 Long term (current) use of anticoagulants: Secondary | ICD-10-CM | POA: Diagnosis not present

## 2018-01-18 ENCOUNTER — Telehealth: Payer: Self-pay | Admitting: Cardiology

## 2018-01-18 NOTE — Telephone Encounter (Signed)
Patient states that last Thursday he thought he was having a heart attack. Patient states that it was the same pain he had prior to having cholecystectomy. Patient then thought if he was having a heart attack, his pacemaker/ICD would go off. Patient states now that when it bends over it hurts and the right side of his back hurts. Patient also states that the pacemaker site is bruised and warm to the touch. Patient states that under his right breast "it looks like a rash in color, but it looks more like a bruise, it is not dark like a bruise." Pacemaker was put in by Dr. Ola Spurr about one year ago. Patient has been having remote pacemaker checks with [facility] and planned to see Dr. Curt Bears when he starts coming to [facility].  Beverly Gust, RN for Dr. Curt Bears. Advised Sherri of patient's situation. Patient advised for patient to be scheduled for this Wednesday 01/20/18 at 1:45 pm. Advised patient to arrive at 1:30 pm on 01/20/18. Advised patient to come to the same office in [facility] where he last seen Dr. Bettina Gavia. Patient verbalized understanding, no further questions.

## 2018-01-18 NOTE — Telephone Encounter (Signed)
Patient wants to speak to you regarding his pacemaker

## 2018-01-20 ENCOUNTER — Encounter (INDEPENDENT_AMBULATORY_CARE_PROVIDER_SITE_OTHER): Payer: Self-pay

## 2018-01-20 ENCOUNTER — Ambulatory Visit (INDEPENDENT_AMBULATORY_CARE_PROVIDER_SITE_OTHER): Payer: Medicare Other | Admitting: Cardiology

## 2018-01-20 ENCOUNTER — Encounter: Payer: Self-pay | Admitting: Cardiology

## 2018-01-20 VITALS — BP 95/62 | HR 106 | Ht 67.0 in | Wt 221.0 lb

## 2018-01-20 DIAGNOSIS — I10 Essential (primary) hypertension: Secondary | ICD-10-CM | POA: Diagnosis not present

## 2018-01-20 DIAGNOSIS — I5022 Chronic systolic (congestive) heart failure: Secondary | ICD-10-CM

## 2018-01-20 DIAGNOSIS — M47816 Spondylosis without myelopathy or radiculopathy, lumbar region: Secondary | ICD-10-CM | POA: Diagnosis not present

## 2018-01-20 DIAGNOSIS — M1612 Unilateral primary osteoarthritis, left hip: Secondary | ICD-10-CM | POA: Diagnosis not present

## 2018-01-20 DIAGNOSIS — M170 Bilateral primary osteoarthritis of knee: Secondary | ICD-10-CM | POA: Diagnosis not present

## 2018-01-20 DIAGNOSIS — I509 Heart failure, unspecified: Secondary | ICD-10-CM | POA: Diagnosis not present

## 2018-01-20 DIAGNOSIS — I482 Chronic atrial fibrillation, unspecified: Secondary | ICD-10-CM

## 2018-01-20 DIAGNOSIS — M1611 Unilateral primary osteoarthritis, right hip: Secondary | ICD-10-CM | POA: Diagnosis not present

## 2018-01-20 DIAGNOSIS — G894 Chronic pain syndrome: Secondary | ICD-10-CM | POA: Diagnosis not present

## 2018-01-20 DIAGNOSIS — Z79891 Long term (current) use of opiate analgesic: Secondary | ICD-10-CM | POA: Diagnosis not present

## 2018-01-20 DIAGNOSIS — M47814 Spondylosis without myelopathy or radiculopathy, thoracic region: Secondary | ICD-10-CM | POA: Diagnosis not present

## 2018-01-20 DIAGNOSIS — Z7901 Long term (current) use of anticoagulants: Secondary | ICD-10-CM | POA: Diagnosis not present

## 2018-01-20 DIAGNOSIS — M159 Polyosteoarthritis, unspecified: Secondary | ICD-10-CM | POA: Diagnosis not present

## 2018-01-20 DIAGNOSIS — Z720 Tobacco use: Secondary | ICD-10-CM | POA: Diagnosis not present

## 2018-01-20 DIAGNOSIS — J449 Chronic obstructive pulmonary disease, unspecified: Secondary | ICD-10-CM | POA: Diagnosis not present

## 2018-01-20 NOTE — Progress Notes (Signed)
Electrophysiology Office Note   Date:  01/20/2018   ID:  BRENT TAILLON, DOB 1951-01-25, MRN [id number]  PCP:  Greig Right, MD  Cardiologist:  Bettina Gavia Primary Electrophysiologist:  Cariann Kinnamon Meredith Leeds, MD    Chief Complaint  Patient presents with  . Defib Check    Nonischemic cardiomyopathy/Chronic systolic CHF/CAF     History of Present Illness: Jaime Scott is a 67 y.o. male who is being seen today for the evaluation of CHF at the request of Shirlee More. Presenting today for electrophysiology evaluation.  He has a history of heart failure, left bundle branch block, nonischemic cardiomyopathy with an EF of 25%, and ICD with a failed attempt at pacing the left ventricle, hypertension, chronic atrial fibrillation.    Today, he denies symptoms of palpitations, chest pain, shortness of breath, orthopnea, PND, lower extremity edema, claudication, dizziness, presyncope, syncope, bleeding, or neurologic sequela. The patient is tolerating medications without difficulties.  He did have an episode of abdominal pain that occurred in his right upper quadrant and radiated to the center of his abdomen.  The pain is not constant it comes and goes without exacerbating or alleviating factors.  He says that his primary physician has recommended him to have a colonoscopy.  He also says that over the weekend, h the skin over his ICD was warm and with a rash.   since that time, the rash is most improved and it is no longer warm.   Past Medical History:  Diagnosis Date  . Alcohol abuse   . CHF (congestive heart failure) ([place]) 03/12/2017  . Chronic atrial fibrillation ([place]) 03/12/2017  . COPD (chronic obstructive pulmonary disease) ([place]) 03/12/2017  . Essential hypertension 03/12/2017  . Hyperlipidemia 03/12/2017  . Ischemic cardiomyopathy 03/12/2017  . Long term (current) use of anticoagulants 03/12/2017  . Peripheral vascular disease ([place]) 03/12/2017  . Presence of combination internal cardiac  defibrillator (ICD) and pacemaker 03/12/2017   unable to place a CRT   . Type 2 diabetes mellitus without complications ([place]) 4/66/5993   Past Surgical History:  Procedure Laterality Date  . CHOLECYSTECTOMY    . ERCP     stone removal  . EXCISION OF BREAST BIOPSY     Age 25, benign     Current Outpatient Medications  Medication Sig Dispense Refill  . carvedilol (COREG) 6.25 MG tablet Take 6.25 mg by mouth 2 (two) times daily with a meal.    . digoxin (LANOXIN) 0.125 MG tablet Take 0.125 mg by mouth daily.    Marland Kitchen diltiazem (CARDIZEM) 30 MG tablet Take 30 mg by mouth 2 (two) times daily.    . enalapril (VASOTEC) 5 MG tablet Take 1 tablet (5 mg total) by mouth daily. 180 tablet 3  . furosemide (LASIX) 80 MG tablet Take 80 mg by mouth 2 (two) times daily.    . metFORMIN (GLUCOPHAGE-XR) 500 MG 24 hr tablet Take 500 mg by mouth 2 (two) times daily.    . Oxycodone HCl 10 MG TABS Take 10 mg by mouth every 8 (eight) hours as needed (chronic pain).    . simvastatin (ZOCOR) 20 MG tablet Take 20 mg by mouth daily.    Marland Kitchen warfarin (COUMADIN) 1 MG tablet Take 1 mg by mouth daily.    Marland Kitchen warfarin (COUMADIN) 5 MG tablet Take 5 mg by mouth daily.     No current facility-administered medications for this visit.     Allergies:   Ivp dye [iodinated diagnostic agents]   Social History:  The patient  reports that he has been smoking cigarettes.  He has been smoking about 1.00 pack per day. He has never used smokeless tobacco. He reports that he does not drink alcohol or use drugs.   Family History:  The patient's family history includes Alcohol abuse in his brother and brother; Breast cancer in his sister.    ROS:  Please see the history of present illness.   Otherwise, review of systems is positive for abdominal pain.   All other systems are reviewed and negative.    PHYSICAL EXAM: VS:  BP 95/62   Pulse (!) 106   Ht 5\' 7"  (1.702 m)   Wt 221 lb (100.2 kg)   SpO2 91%   BMI 34.61 kg/m  , BMI Body  mass index is 34.61 kg/m. GEN: Well nourished, well developed, in no acute distress  HEENT: normal  Neck: no JVD, carotid bruits, or masses Cardiac: iRRR; no murmurs, rubs, or gallops,no edema  Respiratory:  clear to auscultation bilaterally, normal work of breathing GI: soft, tenderness in the center abdomen to right upper quadrant, nondistended, + BS MS: no deformity or atrophy  Skin: warm and dry, device pocket is well healed Neuro:  Strength and sensation are intact Psych: euthymic mood, full affect  EKG:  EKG is ordered today. Personal review of the ekg ordered shows atrial fibrillation with rapid response, rate 106, left bundle branch block  Device interrogation is reviewed today in detail.  See PaceArt for details.   Recent Labs: 05/19/2017: BNP 208.3; BUN 5; Creatinine, Ser 0.60; Potassium 3.6; Sodium 139    Lipid Panel  No results found for: CHOL, TRIG, HDL, CHOLHDL, VLDL, LDLCALC, LDLDIRECT   Wt Readings from Last 3 Encounters:  01/20/18 221 lb (100.2 kg)  09/30/17 225 lb (102.1 kg)  09/02/17 221 lb 1.9 oz (100.3 kg)      Other studies Reviewed: Additional studies/ records that were reviewed today include: Myoview 03/25/18  Review of the above records today demonstrates:  1.  Infarction involving the inferior, septal, anteroseptal, and apical walls 2.  Anterior and inferior wall hypokinesis, septal wall dyskinesis.  Mild to moderate left ventricular dilation. 3.  Left ventricular ejection fraction 25%   ASSESSMENT AND PLAN:  1.  Chronic systolic heart failure due to dilated cardiomyopathy: Currently on Coreg and enalapril.  He has an ICD in place with an attempted LV lead placement which was abandoned due to high pacing threshold or phrenic capture.  An inferior lateral branch revealed no phrenic capture but the lead dislodged on sheath removal.  Attempts to recannulate the coronary sinus were associated with dissection and the procedure was abandoned.  Jaime Scott  likely need a further upgrade of his current device.  He has a by V can with a plugged atrial and LV port.  We Jaime Scott plan to allow his abdominal issues to be worked out prior to device upgrade.  At this point, I do feel that he is safe to have a colonoscopy.  He would be at intermediate risk for a an intermediate to low risk procedure.  We Jaime Scott leave his anticoagulation determination to his GI physicians if it does need to be stopped, would restart as soon as possible.  2.  Hypertension: Blood pressure is low today.  He is currently asymptomatic.  3.  Chronic atrial fibrillation: Currently on warfarin.  He is on carvedilol and diltiazem.  He likely needs both of these for rate control.  If we do plan for by  V upgrade, he would likely benefit from AV nodal ablation post procedure.  Current medicines are reviewed at length with the patient today.   The patient does not have concerns regarding his medicines.  The following changes were made today:  none  Labs/ tests ordered today include:  Orders Placed This Encounter  Procedures  . EKG 12-Lead   Case discussed with primary cardiology  Disposition:   FU with Jaime Scott 3 months  Signed, Lamichael Youkhana Meredith Leeds, MD  01/20/2018 1:46 PM     [address] [place] [address] [phone] (office) [phone] (fax)

## 2018-01-20 NOTE — Patient Instructions (Signed)
Medication Instructions:  Your physician recommends that you continue on your current medications as directed. Please refer to the Current Medication list given to you today.  Labwork: None ordered.  Testing/Procedures: None ordered  Follow-Up: Your physician recommends that you schedule a follow-up appointment in: 3 months with Dr. Curt Bears.    * If you need a refill on your cardiac medications before your next appointment, please call your pharmacy.   *Please note that any paperwork needing to be filled out by the provider will need to be addressed at the front desk prior to seeing the provider. Please note that any FMLA, disability or other documents regarding health condition is subject to a $25.00 charge that must be received prior to completion of paperwork in the form of a money order or check.  Thank you for choosing [facility]!!   Trinidad Curet, RN [phone]  Any Other Special Instructions Will Be Listed Below (If Applicable). Our device clinic will arrange you device to be followed by our clinic.

## 2018-01-21 ENCOUNTER — Telehealth: Payer: Self-pay

## 2018-01-21 DIAGNOSIS — Z1211 Encounter for screening for malignant neoplasm of colon: Secondary | ICD-10-CM | POA: Diagnosis not present

## 2018-01-21 DIAGNOSIS — Z01818 Encounter for other preprocedural examination: Secondary | ICD-10-CM | POA: Diagnosis not present

## 2018-01-21 DIAGNOSIS — R1011 Right upper quadrant pain: Secondary | ICD-10-CM | POA: Diagnosis not present

## 2018-01-21 NOTE — Telephone Encounter (Signed)
Spoke with Santiago Glad [phone] ext. [phone] from [place] in regards to patient having a colonoscopy 7/16 and is wondering what they need to do with the patient's defibrillator. Please advise, thank you

## 2018-01-21 NOTE — Telephone Encounter (Signed)
Will route to device clinic to handle given device question, not so much a preop clearance. Milika Ventress PA-C

## 2018-01-22 NOTE — Telephone Encounter (Signed)
LM w/Karen instructing her to place a magnet over patient's device during the colonoscopy. Encouraged Santiago Glad to call back if she has any further questions. Direct # provided.

## 2018-02-03 DIAGNOSIS — K089 Disorder of teeth and supporting structures, unspecified: Secondary | ICD-10-CM | POA: Diagnosis not present

## 2018-02-08 DIAGNOSIS — Z7901 Long term (current) use of anticoagulants: Secondary | ICD-10-CM | POA: Diagnosis not present

## 2018-02-09 DIAGNOSIS — Z95810 Presence of automatic (implantable) cardiac defibrillator: Secondary | ICD-10-CM | POA: Diagnosis not present

## 2018-02-09 DIAGNOSIS — D125 Benign neoplasm of sigmoid colon: Secondary | ICD-10-CM | POA: Diagnosis not present

## 2018-02-09 DIAGNOSIS — D126 Benign neoplasm of colon, unspecified: Secondary | ICD-10-CM | POA: Diagnosis not present

## 2018-02-09 DIAGNOSIS — E119 Type 2 diabetes mellitus without complications: Secondary | ICD-10-CM | POA: Diagnosis not present

## 2018-02-09 DIAGNOSIS — Z1211 Encounter for screening for malignant neoplasm of colon: Secondary | ICD-10-CM | POA: Diagnosis not present

## 2018-02-09 DIAGNOSIS — I4891 Unspecified atrial fibrillation: Secondary | ICD-10-CM | POA: Diagnosis not present

## 2018-02-09 DIAGNOSIS — K5730 Diverticulosis of large intestine without perforation or abscess without bleeding: Secondary | ICD-10-CM | POA: Diagnosis not present

## 2018-02-09 DIAGNOSIS — K648 Other hemorrhoids: Secondary | ICD-10-CM | POA: Diagnosis not present

## 2018-02-09 DIAGNOSIS — F17200 Nicotine dependence, unspecified, uncomplicated: Secondary | ICD-10-CM | POA: Diagnosis not present

## 2018-02-09 DIAGNOSIS — Z7901 Long term (current) use of anticoagulants: Secondary | ICD-10-CM | POA: Diagnosis not present

## 2018-02-09 DIAGNOSIS — Z9981 Dependence on supplemental oxygen: Secondary | ICD-10-CM | POA: Diagnosis not present

## 2018-02-09 DIAGNOSIS — K635 Polyp of colon: Secondary | ICD-10-CM | POA: Diagnosis not present

## 2018-02-09 DIAGNOSIS — I509 Heart failure, unspecified: Secondary | ICD-10-CM | POA: Diagnosis not present

## 2018-02-09 DIAGNOSIS — D124 Benign neoplasm of descending colon: Secondary | ICD-10-CM | POA: Diagnosis not present

## 2018-02-09 DIAGNOSIS — J449 Chronic obstructive pulmonary disease, unspecified: Secondary | ICD-10-CM | POA: Diagnosis not present

## 2018-02-09 DIAGNOSIS — Z800 Family history of malignant neoplasm of digestive organs: Secondary | ICD-10-CM | POA: Diagnosis not present

## 2018-02-09 DIAGNOSIS — Z79899 Other long term (current) drug therapy: Secondary | ICD-10-CM | POA: Diagnosis not present

## 2018-02-09 DIAGNOSIS — Z7984 Long term (current) use of oral hypoglycemic drugs: Secondary | ICD-10-CM | POA: Diagnosis not present

## 2018-02-10 DIAGNOSIS — M1611 Unilateral primary osteoarthritis, right hip: Secondary | ICD-10-CM | POA: Diagnosis not present

## 2018-02-10 DIAGNOSIS — Z720 Tobacco use: Secondary | ICD-10-CM | POA: Diagnosis not present

## 2018-02-10 DIAGNOSIS — J449 Chronic obstructive pulmonary disease, unspecified: Secondary | ICD-10-CM | POA: Diagnosis not present

## 2018-02-10 DIAGNOSIS — Z79891 Long term (current) use of opiate analgesic: Secondary | ICD-10-CM | POA: Diagnosis not present

## 2018-02-10 DIAGNOSIS — M47816 Spondylosis without myelopathy or radiculopathy, lumbar region: Secondary | ICD-10-CM | POA: Diagnosis not present

## 2018-02-10 DIAGNOSIS — M47814 Spondylosis without myelopathy or radiculopathy, thoracic region: Secondary | ICD-10-CM | POA: Diagnosis not present

## 2018-02-10 DIAGNOSIS — I509 Heart failure, unspecified: Secondary | ICD-10-CM | POA: Diagnosis not present

## 2018-02-10 DIAGNOSIS — M170 Bilateral primary osteoarthritis of knee: Secondary | ICD-10-CM | POA: Diagnosis not present

## 2018-02-10 DIAGNOSIS — M1612 Unilateral primary osteoarthritis, left hip: Secondary | ICD-10-CM | POA: Diagnosis not present

## 2018-02-10 DIAGNOSIS — Z7901 Long term (current) use of anticoagulants: Secondary | ICD-10-CM | POA: Diagnosis not present

## 2018-02-10 DIAGNOSIS — G894 Chronic pain syndrome: Secondary | ICD-10-CM | POA: Diagnosis not present

## 2018-02-10 DIAGNOSIS — M159 Polyosteoarthritis, unspecified: Secondary | ICD-10-CM | POA: Diagnosis not present

## 2018-03-01 DIAGNOSIS — I4891 Unspecified atrial fibrillation: Secondary | ICD-10-CM | POA: Diagnosis not present

## 2018-03-01 DIAGNOSIS — Z6835 Body mass index (BMI) 35.0-35.9, adult: Secondary | ICD-10-CM | POA: Diagnosis not present

## 2018-03-01 DIAGNOSIS — E1169 Type 2 diabetes mellitus with other specified complication: Secondary | ICD-10-CM | POA: Diagnosis not present

## 2018-03-01 DIAGNOSIS — E7800 Pure hypercholesterolemia, unspecified: Secondary | ICD-10-CM | POA: Diagnosis not present

## 2018-03-01 DIAGNOSIS — I10 Essential (primary) hypertension: Secondary | ICD-10-CM | POA: Diagnosis not present

## 2018-03-01 DIAGNOSIS — Z79899 Other long term (current) drug therapy: Secondary | ICD-10-CM | POA: Diagnosis not present

## 2018-03-01 DIAGNOSIS — J449 Chronic obstructive pulmonary disease, unspecified: Secondary | ICD-10-CM | POA: Diagnosis not present

## 2018-03-03 DIAGNOSIS — I4891 Unspecified atrial fibrillation: Secondary | ICD-10-CM | POA: Diagnosis not present

## 2018-03-08 DIAGNOSIS — Z7901 Long term (current) use of anticoagulants: Secondary | ICD-10-CM | POA: Diagnosis not present

## 2018-03-11 DIAGNOSIS — M1612 Unilateral primary osteoarthritis, left hip: Secondary | ICD-10-CM | POA: Diagnosis not present

## 2018-03-11 DIAGNOSIS — G894 Chronic pain syndrome: Secondary | ICD-10-CM | POA: Diagnosis not present

## 2018-03-11 DIAGNOSIS — Z79891 Long term (current) use of opiate analgesic: Secondary | ICD-10-CM | POA: Diagnosis not present

## 2018-03-11 DIAGNOSIS — Z720 Tobacco use: Secondary | ICD-10-CM | POA: Diagnosis not present

## 2018-03-11 DIAGNOSIS — I509 Heart failure, unspecified: Secondary | ICD-10-CM | POA: Diagnosis not present

## 2018-03-11 DIAGNOSIS — M170 Bilateral primary osteoarthritis of knee: Secondary | ICD-10-CM | POA: Diagnosis not present

## 2018-03-11 DIAGNOSIS — M47814 Spondylosis without myelopathy or radiculopathy, thoracic region: Secondary | ICD-10-CM | POA: Diagnosis not present

## 2018-03-11 DIAGNOSIS — M47816 Spondylosis without myelopathy or radiculopathy, lumbar region: Secondary | ICD-10-CM | POA: Diagnosis not present

## 2018-03-11 DIAGNOSIS — M159 Polyosteoarthritis, unspecified: Secondary | ICD-10-CM | POA: Diagnosis not present

## 2018-03-11 DIAGNOSIS — J449 Chronic obstructive pulmonary disease, unspecified: Secondary | ICD-10-CM | POA: Diagnosis not present

## 2018-03-11 DIAGNOSIS — Z7901 Long term (current) use of anticoagulants: Secondary | ICD-10-CM | POA: Diagnosis not present

## 2018-03-11 DIAGNOSIS — M1611 Unilateral primary osteoarthritis, right hip: Secondary | ICD-10-CM | POA: Diagnosis not present

## 2018-03-14 NOTE — Progress Notes (Signed)
Cardiology Office Note:    Date:  03/14/2018   ID:  [name], DOB 03-04-1951, MRN [id number]  PCP:  Greig Right, MD  Cardiologist:  Shirlee More, MD    Referring MD: Greig Right, MD    ASSESSMENT:    1. Chronic atrial fibrillation ([place])   2. Long term (current) use of anticoagulants   3. High risk medication use   4. Presence of combination internal cardiac defibrillator (ICD) and pacemaker   5. Chronic systolic heart failure ([place])   6. Hypertensive heart disease with heart failure ([place])    PLAN:    In order of problems listed above:  1. Stable rate controlled continue current treatment including warfarin for stroke prophylaxis managed by his PCP goal INR 2.5 and digoxin. 2. Continue warfarin see above 3. Continue digoxin recheck level keep less than 1 to avoid the increased mortality when used for heart failure and atrial fibrillation 4. Stable managed in our practice he is pending upgrade to biventricular 5. Stable continue his current loop diuretic ACE inhibitor and beta-blocker 6. Stable blood pressure at target check renal function for potassium and proBNP level   Next appointment: 6 months   Medication Adjustments/Labs and Tests Ordered: Current medicines are reviewed at length with the patient today.  Concerns regarding medicines are outlined above.  No orders of the defined types were placed in this encounter.  No orders of the defined types were placed in this encounter.   No chief complaint on file.   History of Present Illness:    VERSIE FLEENER is a 67 y.o. male with a hx of heart failure, LBBB, nonischemic cardiomyopathy and EF 25% MUGA 03/21/17 , ICD with failed attempt at placing left ventricular pacing lead for cardiac resynchronization. Implantable defibrillator in place with backup ventricular pacing available if required, hypertension, and chronic atrial fibrillation  last seen 09/30/17. Compliance with diet, lifestyle and  medications: Yes but he does not weigh at home  Clinically he is in a plateau is short of breath with more than usual activities but he notices increased edema and wonders if he needs to use a higher dose of diuretic.  He does eat at home predominantly sodium restricts but will not weigh daily.  No chest pain orthopnea palpitations syncope or TIA.  He is overdue for labs and I will check a BMP proBNP and digoxin level today Past Medical History:  Diagnosis Date  . Alcohol abuse   . CHF (congestive heart failure) ([place]) 03/12/2017  . Chronic atrial fibrillation ([place]) 03/12/2017  . COPD (chronic obstructive pulmonary disease) ([place]) 03/12/2017  . Essential hypertension 03/12/2017  . Hyperlipidemia 03/12/2017  . Ischemic cardiomyopathy 03/12/2017  . Long term (current) use of anticoagulants 03/12/2017  . Peripheral vascular disease ([place]) 03/12/2017  . 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 Type 2 diabetes mellitus without complications ([place]) 4/40/1027    Past Surgical History:  Procedure Laterality Date  . CHOLECYSTECTOMY    . ERCP     stone removal  . EXCISION OF BREAST BIOPSY     Age 15, benign    Current Medications: No outpatient medications have been marked as taking for the 03/15/18 encounter (Appointment) with Richardo Priest, MD.     Allergies:   Ivp dye [iodinated diagnostic agents]   Social History   Socioeconomic History  . Marital status: Divorced    Spouse name: Not on file  .  Number of children: Not on file  . Years of education: Not on file  . Highest education level: Not on file  Occupational History  . Not on file  Social Needs  . Financial resource strain: Not on file  . Food insecurity:    Worry: Not on file    Inability: Not on file  . Transportation needs:    Medical: Not on file    Non-medical: Not on file  Tobacco Use  . Smoking status: Current Every Day Smoker    Packs/day: 1.00      Types: Cigarettes  . Smokeless tobacco: Never Used  Substance and Sexual Activity  . Alcohol use: No  . Drug use: No  . Sexual activity: Not on file  Lifestyle  . Physical activity:    Days per week: Not on file    Minutes per session: Not on file  . Stress: Not on file  Relationships  . Social connections:    Talks on phone: Not on file    Gets together: Not on file    Attends religious service: Not on file    Active member of club or organization: Not on file    Attends meetings of clubs or organizations: Not on file    Relationship status: Not on file  Other Topics Concern  . Not on file  Social History Narrative  . Not on file     Family History: The patient's family history includes Alcohol abuse in his brother and brother; Breast cancer in his sister. ROS:   Please see the history of present illness.    All other systems reviewed and are negative.  EKGs/Labs/Other Studies Reviewed:    The following studies were reviewed today:  Recent Labs: 05/19/2017: BNP 208.3; BUN 5; Creatinine, Ser 0.60; Potassium 3.6; Sodium 139  Recent Lipid Panel No results found for: CHOL, TRIG, HDL, CHOLHDL, VLDL, LDLCALC, LDLDIRECT  Physical Exam:    VS:  There were no vitals taken for this visit.    Wt Readings from Last 3 Encounters:  01/20/18 221 lb (100.2 kg)  09/30/17 225 lb (102.1 kg)  09/02/17 221 lb 1.9 oz (100.3 kg)     GEN:  Well nourished, well developed in no acute distress HEENT: Normal NECK: No JVD; No carotid bruits LYMPHATICS: No lymphadenopathy CARDIAC: Irregular irregular variable first heart sound no S3  no murmurs, rubs, gallops RESPIRATORY:  Clear to auscultation without rales, wheezing or rhonchi  ABDOMEN: Soft, non-tender, non-distended MUSCULOSKELETAL: 1-2+ at the ankle bilateral edema; No deformity  SKIN: Warm and dry NEUROLOGIC:  Alert and oriented x 3 PSYCHIATRIC:  Normal affect    Signed, Shirlee More, MD  03/14/2018 7:02 PM    [place]

## 2018-03-15 ENCOUNTER — Ambulatory Visit (INDEPENDENT_AMBULATORY_CARE_PROVIDER_SITE_OTHER): Payer: Medicare Other | Admitting: Cardiology

## 2018-03-15 ENCOUNTER — Encounter: Payer: Self-pay | Admitting: Cardiology

## 2018-03-15 VITALS — BP 120/80 | HR 90 | Ht 67.0 in | Wt 224.0 lb

## 2018-03-15 DIAGNOSIS — I5022 Chronic systolic (congestive) heart failure: Secondary | ICD-10-CM | POA: Diagnosis not present

## 2018-03-15 DIAGNOSIS — Z79899 Other long term (current) drug therapy: Secondary | ICD-10-CM

## 2018-03-15 DIAGNOSIS — I482 Chronic atrial fibrillation, unspecified: Secondary | ICD-10-CM

## 2018-03-15 DIAGNOSIS — Z7901 Long term (current) use of anticoagulants: Secondary | ICD-10-CM | POA: Diagnosis not present

## 2018-03-15 DIAGNOSIS — Z95810 Presence of automatic (implantable) cardiac defibrillator: Secondary | ICD-10-CM

## 2018-03-15 DIAGNOSIS — I110 Hypertensive heart disease with heart failure: Secondary | ICD-10-CM

## 2018-03-15 LAB — DIGOXIN LEVEL: Digoxin, Serum: 0.5 ng/mL (ref 0.5–0.9)

## 2018-03-15 LAB — BASIC METABOLIC PANEL
BUN/Creatinine Ratio: 10 (ref 10–24)
BUN: 6 mg/dL — ABNORMAL LOW (ref 8–27)
CALCIUM: 8.7 mg/dL (ref 8.6–10.2)
CHLORIDE: 96 mmol/L (ref 96–106)
CO2: 31 mmol/L — ABNORMAL HIGH (ref 20–29)
Creatinine, Ser: 0.62 mg/dL — ABNORMAL LOW (ref 0.76–1.27)
GFR calc non Af Amer: 103 mL/min/{1.73_m2} (ref >59)
GFR, EST AFRICAN AMERICAN: 119 mL/min/{1.73_m2} (ref >59)
Glucose: 170 mg/dL — ABNORMAL HIGH (ref 65–99)
POTASSIUM: 4.2 mmol/L (ref 3.5–5.2)
Sodium: 141 mmol/L (ref 134–144)

## 2018-03-15 MED ORDER — FUROSEMIDE 80 MG PO TABS: ORAL_TABLET | ORAL | 5 refills | Status: DC

## 2018-03-15 NOTE — Patient Instructions (Addendum)
Medication Instructions:  Your physician has recommended you make the following change in your medication:   INCREASE: furosemide =Take 1.5 tablets (120 mg) in the morning, and 1 tablet (80mg ) in the evening  Labwork: You will have lab work today:  BMP, BNP, Digoxin  Testing/Procedures: NONE  Follow-Up: Your physician wants you to follow-up in: 6 months. You will receive a reminder letter in the mail two months in advance. If you don't receive a letter, please call our office to schedule the follow-up appointment.   Any Other Special Instructions Will Be Listed Below (If Applicable).     If you need a refill on your cardiac medications before your next appointment, please call your pharmacy.     Heart Failure  Weigh yourself every morning when you first wake up and record on a calender or note pad, bring this to your office visits. Using a pill tender can help with taking your medications consistently.  Limit your fluid intake to 2 liters daily  Limit your sodium intake to less than 2-3 grams daily. Ask if you need dietary teaching.  If you gain more than 3 pounds (from your dry weight ), double your dose of diuretic for the day.  If you gain more than 5 pounds (from your dry weight), double your dose of lasix and call your heart failure doctor.  Please do not smoke tobacco since it is very bad for your heart.  Please do not drink alcohol since it can worsen your heart failure.Also avoid OTC nonsteroidal drugs, such as advil, aleve and motrin.  Try to exercise for at least 30 minutes every day because this will help your heart be more efficient. You may be eligible for supervised cardiac rehab, ask your physician.

## 2018-03-22 ENCOUNTER — Telehealth: Payer: Self-pay | Admitting: Cardiology

## 2018-03-22 NOTE — Telephone Encounter (Signed)
Wants blood work results °

## 2018-03-31 DIAGNOSIS — Z7901 Long term (current) use of anticoagulants: Secondary | ICD-10-CM | POA: Diagnosis not present

## 2018-03-31 DIAGNOSIS — I4891 Unspecified atrial fibrillation: Secondary | ICD-10-CM | POA: Diagnosis not present

## 2018-04-20 DIAGNOSIS — I4891 Unspecified atrial fibrillation: Secondary | ICD-10-CM | POA: Diagnosis not present

## 2018-04-20 DIAGNOSIS — Z7901 Long term (current) use of anticoagulants: Secondary | ICD-10-CM | POA: Diagnosis not present

## 2018-04-21 ENCOUNTER — Ambulatory Visit (INDEPENDENT_AMBULATORY_CARE_PROVIDER_SITE_OTHER): Payer: Medicare Other | Admitting: *Deleted

## 2018-04-21 DIAGNOSIS — I420 Dilated cardiomyopathy: Secondary | ICD-10-CM | POA: Diagnosis not present

## 2018-04-21 NOTE — Progress Notes (Signed)
Remote pacemaker transmission.   

## 2018-04-22 ENCOUNTER — Encounter: Payer: Self-pay | Admitting: Cardiology

## 2018-04-26 ENCOUNTER — Encounter: Payer: Medicare Other | Admitting: Cardiology

## 2018-05-10 ENCOUNTER — Ambulatory Visit (INDEPENDENT_AMBULATORY_CARE_PROVIDER_SITE_OTHER): Payer: Medicare Other | Admitting: Cardiology

## 2018-05-10 ENCOUNTER — Encounter: Payer: Self-pay | Admitting: Cardiology

## 2018-05-10 VITALS — BP 124/64 | HR 88 | Ht 67.0 in | Wt 223.0 lb

## 2018-05-10 DIAGNOSIS — I10 Essential (primary) hypertension: Secondary | ICD-10-CM

## 2018-05-10 DIAGNOSIS — I428 Other cardiomyopathies: Secondary | ICD-10-CM

## 2018-05-10 DIAGNOSIS — Z01812 Encounter for preprocedural laboratory examination: Secondary | ICD-10-CM

## 2018-05-10 DIAGNOSIS — I4821 Permanent atrial fibrillation: Secondary | ICD-10-CM

## 2018-05-10 NOTE — Patient Instructions (Addendum)
Medication Instructions:  Your physician recommends that you continue on your current medications as directed. Please refer to the Current Medication list given to you today.  If you need a refill on your cardiac medications before your next appointment, please call your pharmacy.   Lab work: Pre procedure labs today: BMET & CBC If you have labs (blood work) drawn today and your tests are completely normal, you will receive your results only by: Marland Kitchen MyChart Message (if you have MyChart) OR . A paper copy in the mail If you have any lab test that is abnormal or we need to change your treatment, we will call you to review the results.  Testing/Procedures: Your physician has recommended that you have a biventricular upgrade. Please see the instructions below located under special instructions section.  Your physician has recommended that you have an ablation. Catheter ablation is a medical procedure used to treat some cardiac arrhythmias (irregular heartbeats). During catheter ablation, a long, thin, flexible tube is put into a blood vessel in your groin (upper thigh), or neck. This tube is called an ablation catheter. It is then guided to your heart through the blood vessel. Radio frequency waves destroy small areas of heart tissue where abnormal heartbeats may cause an arrhythmia to start. Please see the instruction below under special instructions.  Follow-Up: Your physician recommends that you schedule a follow-up appointment in: 10-14 days, after your procedure on 05/27/18, with device clinic for a wound check.  Your physician recommends that you schedule a follow-up appointment in: 4 weeks, after your procedure on 06/25/2008  Your physician recommends that you schedule a follow-up appointment in: 91 days, after your procedure on 05/27/2018, with Dr. Curt Bears.  Thank you for choosing [facility]!!   Trinidad Curet, RN [phone]  Any Other Special Instructions Will Be Listed Below  (If Applicable).   Implantable Device Instructions  You are scheduled for:                  _____ BiV upgrade  on  05/27/2018  with Dr. Curt Bears.  1.   Please arrive at the [facility], Entrance "A"  at [facility] at  7:30 a.m. on the day of your procedure. (The address is [address])  2. Do not eat or drink after midnight the night before your procedure.  3.   Complete pre procedure  lab work on 05/10/18.  You do not have to be fasting.  4.   A)  Hold the following medications the morning of your procedure:    1. Lasix   2. Metformin           - All of your remaining medications may be taken with a small amount of water the morning of your procedure.  5.  Plan for an overnight stay.  Bring your insurance cards and a list of you medications.  6.  Wash your chest and neck with surgical scrub the evening before and the morning of  your procedure.  Rinse well. Please review the surgical scrub instruction sheet given to you.  7. Your chest will need to be shaved prior to this procedure (if needed). We ask that you do this yourself at home 1 to 2 days before or if uncomfortable/unable to do yourself, then it will be performed by the hospital staff the day of.                                                                                                          *  If you have ANY questions after you get home, please call Trinidad Curet, RN @ [phone].  * Every attempt is made to prevent procedures from being rescheduled.  Due to the nature of  Electrophysiology, rescheduling can happen.  The physician is always aware and directs the staff when this occurs.    Instructions for your ablation: 1. Please arrive at the [facility], Main Entrance "A", of [facility] at 5:30 a.m. on 11/29/20192. 2. Do not eat or drink after midnight the night prior to the procedure. 3. Do not miss any doses of Coumadin prior to the morning of the procedure.  4. Do not take  any medications the morning of the procedure. 5. Both of your groins will need to be shaved for this procedure (if needed). We ask that you do this yourself at home 1-2 days prior to the procedure.  If you are unable/uncomfortable to do yourself, the hospital staff will shave you the day of your procedure (if needed). 6. Plan for an overnight stay in the hospital. 7. You will need someone to drive you home at discharge.     Cardiac Ablation Cardiac ablation is a procedure to disable (ablate) a small amount of heart tissue in very specific places. The heart has many electrical connections. Sometimes these connections are abnormal and can cause the heart to beat very fast or irregularly. Ablating some of the problem areas can improve the heart rhythm or return it to normal. Ablation may be done for people who:  Have Wolff-Parkinson-White syndrome.  Have fast heart rhythms (tachycardia).  Have taken medicines for an abnormal heart rhythm (arrhythmia) that were not effective or caused side effects.  Have a high-risk heartbeat that may be life-threatening.  During the procedure, a small incision is made in the neck or the groin, and a long, thin, flexible tube (catheter) is inserted into the incision and moved to the heart. Small devices (electrodes) on the tip of the catheter will send out electrical currents. A type of X-ray (fluoroscopy) will be used to help guide the catheter and to provide images of the heart. Tell a health care provider about:  Any allergies you have.  All medicines you are taking, including vitamins, herbs, eye drops, creams, and over-the-counter medicines.  Any problems you or family members have had with anesthetic medicines.  Any blood disorders you have.  Any surgeries you have had.  Any medical conditions you have, such as kidney failure.  Whether you are pregnant or may be pregnant. What are the risks? Generally, this is a safe procedure. However, problems  may occur, including:  Infection.  Bruising and bleeding at the catheter insertion site.  Bleeding into the chest, especially into the sac that surrounds the heart. This is a serious complication.  Stroke or blood clots.  Damage to other structures or organs.  Allergic reaction to medicines or dyes.  Need for a permanent pacemaker if the normal electrical system is damaged. A pacemaker is a small computer that sends electrical signals to the heart and helps your heart beat normally.  The procedure not being fully effective. This may not be recognized until months later. Repeat ablation procedures are sometimes required.  What happens before the procedure?  Follow instructions from your health care provider about eating or drinking restrictions.  Ask your health care provider about: ? Changing or stopping your regular medicines. This is especially important if you are taking diabetes medicines or blood thinners. ? Taking medicines such  as aspirin and ibuprofen. These medicines can thin your blood. Do not take these medicines before your procedure if your health care provider instructs you not to.  Plan to have someone take you home from the hospital or clinic.  If you will be going home right after the procedure, plan to have someone with you for 24 hours. What happens during the procedure?  To lower your risk of infection: ? Your health care team will wash or sanitize their hands. ? Your skin will be washed with soap. ? Hair may be removed from the incision area.  An IV tube will be inserted into one of your veins.  You will be given a medicine to help you relax (sedative).  The skin on your neck or groin will be numbed.  An incision will be made in your neck or your groin.  A needle will be inserted through the incision and into a large vein in your neck or groin.  A catheter will be inserted into the needle and moved to your heart.  Dye may be injected through the  catheter to help your surgeon see the area of the heart that needs treatment.  Electrical currents will be sent from the catheter to ablate heart tissue in desired areas. There are three types of energy that may be used to ablate heart tissue: ? Heat (radiofrequency energy). ? Laser energy. ? Extreme cold (cryoablation).  When the necessary tissue has been ablated, the catheter will be removed.  Pressure will be held on the catheter insertion area to prevent excessive bleeding.  A bandage (dressing) will be placed over the catheter insertion area. The procedure may vary among health care providers and hospitals. What happens after the procedure?  Your blood pressure, heart rate, breathing rate, and blood oxygen level will be monitored until the medicines you were given have worn off.  Your catheter insertion area will be monitored for bleeding. You will need to lie still for a few hours to ensure that you do not bleed from the catheter insertion area.  Do not drive for 24 hours or as long as directed by your health care provider. Summary  Cardiac ablation is a procedure to disable (ablate) a small amount of heart tissue in very specific places. Ablating some of the problem areas can improve the heart rhythm or return it to normal.  During the procedure, electrical currents will be sent from the catheter to ablate heart tissue in desired areas. This information is not intended to replace advice given to you by your health care provider. Make sure you discuss any questions you have with your health care provider. Document Released: 11/30/2008 Document Revised: 06/02/2016 Document Reviewed: 06/02/2016 Elsevier Interactive Patient Education  [employer].

## 2018-05-10 NOTE — Progress Notes (Signed)
Electrophysiology Office Note   Date:  05/10/2018   ID:  Jaime Scott, DOB September 30, 1950, MRN [id number]  PCP:  Greig Right, MD  Cardiologist:  Bettina Gavia Primary Electrophysiologist:  Dava Rensch Meredith Leeds, MD    No chief complaint on file.    History of Present Illness: Jaime Scott is a 67 y.o. male who is being seen today for the evaluation of CHF at the request of Shirlee More. Presenting today for electrophysiology evaluation.  He has a history of heart failure, left bundle branch block, nonischemic cardiomyopathy with an EF of 25%, and ICD with a failed attempt at pacing the left ventricle, hypertension, chronic atrial fibrillation.  Today, denies symptoms of palpitations, chest pain, shortness of breath, orthopnea, PND, lower extremity edema, claudication, dizziness, presyncope, syncope, bleeding, or neurologic sequela. The patient is tolerating medications without difficulties.  His main complaints are of weakness and shortness of breath.  He gets short of breath with doing much in the way of activity.  This is gotten worse over the last few months.   Past Medical History:  Diagnosis Date  . Alcohol abuse   . CHF (congestive heart failure) ([place]) 03/12/2017  . Chronic atrial fibrillation 03/12/2017  . COPD (chronic obstructive pulmonary disease) ([place]) 03/12/2017  . Essential hypertension 03/12/2017  . Hyperlipidemia 03/12/2017  . Ischemic cardiomyopathy 03/12/2017  . Long term (current) use of anticoagulants 03/12/2017  . Peripheral vascular disease ([place]) 03/12/2017  . 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 Type 2 diabetes mellitus without complications ([place]) 9/62/9528   Past Surgical History:  Procedure Laterality Date  . CHOLECYSTECTOMY    . ERCP     stone removal  . EXCISION OF BREAST BIOPSY     Age 34, benign     Current Outpatient Medications  Medication Sig Dispense Refill  .  carvedilol (COREG) 6.25 MG tablet Take 6.25 mg by mouth 2 (two) times daily with a meal.    . digoxin (LANOXIN) 0.125 MG tablet Take 0.125 mg by mouth daily.    Marland Kitchen diltiazem (CARDIZEM) 30 MG tablet Take 30 mg by mouth 2 (two) times daily.    . enalapril (VASOTEC) 5 MG tablet Take 1 tablet (5 mg total) by mouth daily. 180 tablet 3  . furosemide (LASIX) 80 MG tablet Take 1.5 tablets (120 mg) in the morning, and 1 tablet (80mg ) in the evening 75 tablet 5  . ipratropium (ATROVENT) 0.02 % nebulizer solution Take 0.5 mg by nebulization every 6 (six) hours as needed for wheezing or shortness of breath.    . metFORMIN (GLUCOPHAGE-XR) 500 MG 24 hr tablet Take 1,000 mg by mouth 2 (two) times daily.     . Oxycodone HCl 10 MG TABS Take 10 mg by mouth every 8 (eight) hours as needed (chronic pain).    . potassium chloride SA (K-DUR,KLOR-CON) 20 MEQ tablet Take 20 mEq by mouth daily.    . simvastatin (ZOCOR) 20 MG tablet Take 20 mg by mouth daily.    Marland Kitchen warfarin (COUMADIN) 1 MG tablet Take by mouth daily. Takes 7mg  M.W.F, Takes 6mg  S.S.T.Th    . warfarin (COUMADIN) 5 MG tablet Take by mouth daily. Takes 7mg  M.W.F, Takes 6mg  S.S.T.Th     No current facility-administered medications for this visit.     Allergies:   Ivp dye [iodinated diagnostic agents]   Social History:  The patient  reports that he has been smoking cigarettes.  He has been smoking about 1.00 pack per day. He has never used smokeless tobacco. He reports that he does not drink alcohol or use drugs.   Family History:  The patient's family history includes Alcohol abuse in his brother and brother; Breast cancer in his sister.    ROS:  Please see the history of present illness.   Otherwise, review of systems is positive for fatigue, shortness of breath.   All other systems are reviewed and negative.   PHYSICAL EXAM: VS:  BP 124/64   Pulse 88   Ht 5\' 7"  (1.702 m)   Wt 223 lb (101.2 kg)   SpO2 90%   BMI 34.93 kg/m  , BMI Body mass index is  34.93 kg/m. GEN: Well nourished, well developed, in no acute distress  HEENT: normal  Neck: no JVD, carotid bruits, or masses Cardiac: iRRR; no murmurs, rubs, or gallops,no edema  Respiratory:  clear to auscultation bilaterally, normal work of breathing GI: soft, nontender, nondistended, + BS MS: no deformity or atrophy  Skin: warm and dry, device site well healed Neuro:  Strength and sensation are intact Psych: euthymic mood, full affect  EKG:  EKG is not ordered today. Personal review of the ekg ordered 01/20/18 shows real fibrillation, left bundle branch block  Personal review of the device interrogation today. Results in [place]: 05/19/2017: BNP 208.3 03/15/2018: BUN 6; Creatinine, Ser 0.62; Potassium 4.2; Sodium 141    Lipid Panel  No results found for: CHOL, TRIG, HDL, CHOLHDL, VLDL, LDLCALC, LDLDIRECT   Wt Readings from Last 3 Encounters:  05/10/18 223 lb (101.2 kg)  03/15/18 224 lb (101.6 kg)  01/20/18 221 lb (100.2 kg)      Other studies Reviewed: Additional studies/ records that were reviewed today include: Myoview 03/25/18  Review of the above records today demonstrates:  1.  Infarction involving the inferior, septal, anteroseptal, and apical walls 2.  Anterior and inferior wall hypokinesis, septal wall dyskinesis.  Mild to moderate left ventricular dilation. 3.  Left ventricular ejection fraction 25%   ASSESSMENT AND PLAN:  1.  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  He is continuing to have shortness of breath.  He has a Medtronic ICD in place.  He had an attempt at placing an LV lead but the lead dislodged from his inferolateral branch.  Reattempts resulted in CS dissection.  He continues to have shortness of breath.  We Breindel Collier plan for ICD upgrade.  Risks and benefits were discussed and include bleeding, tamponade, infection, pneumothorax.  The patient understands these risks and is agreed to the  procedure.   2.  Hypertension: Well-controlled.  No changes.  3.  Chronic atrial fibrillation: Currently on warfarin, carvedilol, and diltiazem.  He is both of these medicines for rate control.  He need an AV node ablation after his CRT upgrade.  Current medicines are reviewed at length with the patient today.   The patient does not have concerns regarding his medicines.  The following changes were made today: None  Labs/ tests ordered today include:  Orders Placed This Encounter  Procedures  . Basic metabolic panel  . CBC   Discussed with primary cardiology  Disposition:   FU with Garlen Reinig 3 months  Signed, Loki Wuthrich Meredith Leeds, MD  05/10/2018 4:11 PM     [address] [place] [address] [phone] (office) [phone] (fax)

## 2018-05-11 ENCOUNTER — Telehealth: Payer: Self-pay | Admitting: Cardiology

## 2018-05-11 LAB — CUP PACEART REMOTE DEVICE CHECK
Battery Remaining Longevity: 109 mo
Battery Voltage: 3.01 V
Brady Statistic AP VP Percent: 0 %
Brady Statistic AS VP Percent: 0 %
Brady Statistic RA Percent Paced: 0 %
Date Time Interrogation Session: 20190925073523
HighPow Impedance: 72 Ohm
Implantable Lead Implant Date: 20170410
Implantable Lead Model: 6935
Implantable Pulse Generator Implant Date: 20170410
Lead Channel Impedance Value: 285 Ohm
Lead Channel Impedance Value: 4047 Ohm
Lead Channel Impedance Value: 4047 Ohm
Lead Channel Impedance Value: 4047 Ohm
Lead Channel Impedance Value: 4047 Ohm
Lead Channel Impedance Value: 4047 Ohm
Lead Channel Impedance Value: 4047 Ohm
Lead Channel Impedance Value: 4047 Ohm
Lead Channel Impedance Value: 4047 Ohm
Lead Channel Impedance Value: 4047 Ohm
Lead Channel Pacing Threshold Amplitude: 0.625 V
Lead Channel Pacing Threshold Pulse Width: 0.4 ms
Lead Channel Sensing Intrinsic Amplitude: 15.125 mV
Lead Channel Setting Pacing Amplitude: 2 V
Lead Channel Setting Pacing Pulse Width: 0.4 ms
Lead Channel Setting Sensing Sensitivity: 0.3 mV
MDC IDC LEAD LOCATION: 753860
MDC IDC MSMT LEADCHNL LV IMPEDANCE VALUE: 4047 Ohm
MDC IDC MSMT LEADCHNL LV IMPEDANCE VALUE: 4047 Ohm
MDC IDC MSMT LEADCHNL LV IMPEDANCE VALUE: 4047 Ohm
MDC IDC MSMT LEADCHNL LV IMPEDANCE VALUE: 4047 Ohm
MDC IDC MSMT LEADCHNL LV IMPEDANCE VALUE: 4047 Ohm
MDC IDC MSMT LEADCHNL LV IMPEDANCE VALUE: 4047 Ohm
MDC IDC MSMT LEADCHNL RA IMPEDANCE VALUE: 4047 Ohm
MDC IDC MSMT LEADCHNL RV IMPEDANCE VALUE: 342 Ohm
MDC IDC MSMT LEADCHNL RV SENSING INTR AMPL: 15.125 mV
MDC IDC STAT BRADY AP VS PERCENT: 0 %
MDC IDC STAT BRADY AS VS PERCENT: 0 %
MDC IDC STAT BRADY RV PERCENT PACED: 0.55 %

## 2018-05-11 LAB — CBC
HEMATOCRIT: 40.3 % (ref 37.5–51.0)
Hemoglobin: 13.5 g/dL (ref 13.0–17.7)
MCH: 29.3 pg (ref 26.6–33.0)
MCHC: 33.5 g/dL (ref 31.5–35.7)
MCV: 88 fL (ref 79–97)
Platelets: 336 10*3/uL (ref 150–450)
RBC: 4.6 x10E6/uL (ref 4.14–5.80)
RDW: 13.2 % (ref 12.3–15.4)
WBC: 12.4 10*3/uL — ABNORMAL HIGH (ref 3.4–10.8)

## 2018-05-11 NOTE — Telephone Encounter (Signed)
Advised pt he does not need to stop Coumadin for his upcoming procedures, per Dr. Curt Bears.  (his BiV upgrade and ablation). Pt appreciative of the confirmation.

## 2018-05-11 NOTE — Telephone Encounter (Signed)
New Message        Patient has questions about his coumadin, would like a call back.

## 2018-05-18 DIAGNOSIS — I4891 Unspecified atrial fibrillation: Secondary | ICD-10-CM | POA: Diagnosis not present

## 2018-05-18 DIAGNOSIS — Z7901 Long term (current) use of anticoagulants: Secondary | ICD-10-CM | POA: Diagnosis not present

## 2018-05-21 ENCOUNTER — Telehealth: Payer: Self-pay | Admitting: *Deleted

## 2018-05-21 MED ORDER — PREDNISONE 50 MG PO TABS: ORAL_TABLET | ORAL | 0 refills | Status: DC

## 2018-05-21 NOTE — Telephone Encounter (Signed)
Advised pt to take Prednisone/Benadryl prior to his procedure next week.  Advised to take: Prednisone 13 hours, 7 hours & 1 hour prior to procedure (told pt he could take his last dose right before walking into the hospital). Advised to also take Benadryl 50 mg 1 hour prior, along with his last Prednisone dose. Patient verbalized understanding and agreeable to plan.

## 2018-05-27 ENCOUNTER — Encounter (HOSPITAL_COMMUNITY): Payer: Self-pay | Admitting: Cardiology

## 2018-05-27 ENCOUNTER — Ambulatory Visit (HOSPITAL_COMMUNITY): Admission: RE | Admit: 2018-05-27 | Payer: Medicare Other | Source: Ambulatory Visit | Admitting: Cardiology

## 2018-05-27 ENCOUNTER — Ambulatory Visit (HOSPITAL_COMMUNITY)
Admission: RE | Disposition: A | Discharge status: Home / Self Care | Payer: Self-pay | Source: Ambulatory Visit | Attending: Cardiology

## 2018-05-27 ENCOUNTER — Other Ambulatory Visit: Payer: Self-pay

## 2018-05-27 ENCOUNTER — Ambulatory Visit (HOSPITAL_COMMUNITY)
Admission: RE | Admit: 2018-05-27 | Discharge: 2018-05-27 | Disposition: A | Discharge status: Home / Self Care | Payer: Medicare Other | Source: Ambulatory Visit | Attending: Cardiology | Admitting: Cardiology

## 2018-05-27 DIAGNOSIS — Z9889 Other specified postprocedural states: Secondary | ICD-10-CM | POA: Diagnosis not present

## 2018-05-27 DIAGNOSIS — E119 Type 2 diabetes mellitus without complications: Secondary | ICD-10-CM | POA: Insufficient documentation

## 2018-05-27 DIAGNOSIS — Z9049 Acquired absence of other specified parts of digestive tract: Secondary | ICD-10-CM | POA: Diagnosis not present

## 2018-05-27 DIAGNOSIS — I110 Hypertensive heart disease with heart failure: Secondary | ICD-10-CM | POA: Insufficient documentation

## 2018-05-27 DIAGNOSIS — Z006 Encounter for examination for normal comparison and control in clinical research program: Secondary | ICD-10-CM | POA: Diagnosis not present

## 2018-05-27 DIAGNOSIS — Z91041 Radiographic dye allergy status: Secondary | ICD-10-CM | POA: Insufficient documentation

## 2018-05-27 DIAGNOSIS — I428 Other cardiomyopathies: Secondary | ICD-10-CM | POA: Insufficient documentation

## 2018-05-27 DIAGNOSIS — Z7984 Long term (current) use of oral hypoglycemic drugs: Secondary | ICD-10-CM | POA: Diagnosis not present

## 2018-05-27 DIAGNOSIS — E785 Hyperlipidemia, unspecified: Secondary | ICD-10-CM | POA: Insufficient documentation

## 2018-05-27 DIAGNOSIS — I482 Chronic atrial fibrillation, unspecified: Secondary | ICD-10-CM | POA: Insufficient documentation

## 2018-05-27 DIAGNOSIS — Z7901 Long term (current) use of anticoagulants: Secondary | ICD-10-CM | POA: Insufficient documentation

## 2018-05-27 DIAGNOSIS — I255 Ischemic cardiomyopathy: Secondary | ICD-10-CM | POA: Insufficient documentation

## 2018-05-27 DIAGNOSIS — I82B12 Acute embolism and thrombosis of left subclavian vein: Secondary | ICD-10-CM | POA: Diagnosis not present

## 2018-05-27 DIAGNOSIS — Z79899 Other long term (current) drug therapy: Secondary | ICD-10-CM | POA: Insufficient documentation

## 2018-05-27 DIAGNOSIS — F17210 Nicotine dependence, cigarettes, uncomplicated: Secondary | ICD-10-CM | POA: Diagnosis not present

## 2018-05-27 DIAGNOSIS — I447 Left bundle-branch block, unspecified: Secondary | ICD-10-CM | POA: Insufficient documentation

## 2018-05-27 DIAGNOSIS — I5022 Chronic systolic (congestive) heart failure: Secondary | ICD-10-CM | POA: Insufficient documentation

## 2018-05-27 HISTORY — PX: BIV PACEMAKER GENERATOR CHANGEOUT: EP1198

## 2018-05-27 LAB — BASIC METABOLIC PANEL
ANION GAP: 10 (ref 5–15)
BUN: 8 mg/dL (ref 8–23)
CHLORIDE: 97 mmol/L — AB (ref 98–111)
CO2: 29 mmol/L (ref 22–32)
CREATININE: 0.86 mg/dL (ref 0.61–1.24)
Calcium: 9 mg/dL (ref 8.9–10.3)
Glucose, Bld: 281 mg/dL — ABNORMAL HIGH (ref 70–99)
Potassium: 4.5 mmol/L (ref 3.5–5.1)
Sodium: 136 mmol/L (ref 135–145)

## 2018-05-27 LAB — CBC
HEMATOCRIT: 43.6 % (ref 39.0–52.0)
HEMOGLOBIN: 13.6 g/dL (ref 13.0–17.0)
MCH: 29.1 pg (ref 26.0–34.0)
MCHC: 31.2 g/dL (ref 30.0–36.0)
MCV: 93.4 fL (ref 80.0–100.0)
NRBC: 0 % (ref 0.0–0.2)
Platelets: 298 10*3/uL (ref 150–400)
RBC: 4.67 MIL/uL (ref 4.22–5.81)
RDW: 13.2 % (ref 11.5–15.5)
WBC: 9.5 10*3/uL (ref 4.0–10.5)

## 2018-05-27 LAB — PROTIME-INR
INR: 2.14
Prothrombin Time: 23.6 seconds — ABNORMAL HIGH (ref 11.4–15.2)

## 2018-05-27 LAB — GLUCOSE, CAPILLARY: Glucose-Capillary: 266 mg/dL — ABNORMAL HIGH (ref 70–99)

## 2018-05-27 LAB — SURGICAL PCR SCREEN
MRSA, PCR: NEGATIVE
Staphylococcus aureus: NEGATIVE

## 2018-05-27 SURGERY — BIV PACEMAKER GENERATOR CHANGEOUT

## 2018-05-27 MED ORDER — IOHEXOL 350 MG/ML SOLN
INTRAVENOUS | Status: DC | PRN
  Administered 2018-05-27: 30 mL via INTRAVENOUS

## 2018-05-27 MED ORDER — SODIUM CHLORIDE 0.9 % IV SOLN
INTRAVENOUS | Status: AC
  Filled 2018-05-27: qty 2

## 2018-05-27 MED ORDER — SODIUM CHLORIDE 0.9 % IV SOLN: INTRAVENOUS | Status: DC

## 2018-05-27 MED ORDER — CEFAZOLIN SODIUM-DEXTROSE 2-4 GM/100ML-% IV SOLN
INTRAVENOUS | Status: AC
  Filled 2018-05-27: qty 100

## 2018-05-27 MED ORDER — FENTANYL CITRATE (PF) 100 MCG/2ML IJ SOLN
INTRAMUSCULAR | Status: DC | PRN
  Administered 2018-05-27: 25 ug via INTRAVENOUS

## 2018-05-27 MED ORDER — MUPIROCIN 2 % EX OINT
1.0000 "application " | TOPICAL_OINTMENT | Freq: Once | CUTANEOUS | Status: DC
  Filled 2018-05-27: qty 22

## 2018-05-27 MED ORDER — MIDAZOLAM HCL 5 MG/5ML IJ SOLN
INTRAMUSCULAR | Status: DC | PRN
  Administered 2018-05-27: 1 mg via INTRAVENOUS

## 2018-05-27 MED ORDER — CEFAZOLIN SODIUM-DEXTROSE 2-4 GM/100ML-% IV SOLN
2.0000 g | INTRAVENOUS | Status: AC
  Administered 2018-05-27: 2 g via INTRAVENOUS
  Filled 2018-05-27: qty 1200

## 2018-05-27 MED ORDER — IOPAMIDOL (ISOVUE-370) INJECTION 76%
INTRAVENOUS | Status: AC
  Filled 2018-05-27: qty 50

## 2018-05-27 MED ORDER — ACETAMINOPHEN 325 MG PO TABS
325.0000 mg | ORAL_TABLET | ORAL | Status: DC | PRN
  Filled 2018-05-27: qty 2

## 2018-05-27 MED ORDER — LIDOCAINE HCL (PF) 1 % IJ SOLN
INTRAMUSCULAR | Status: AC
  Filled 2018-05-27: qty 60

## 2018-05-27 MED ORDER — ONDANSETRON HCL 4 MG/2ML IJ SOLN: 4.0000 mg | Freq: Four times a day (QID) | INTRAMUSCULAR | Status: DC | PRN

## 2018-05-27 MED ORDER — MUPIROCIN 2 % EX OINT
TOPICAL_OINTMENT | CUTANEOUS | Status: AC
  Administered 2018-05-27: 1
  Filled 2018-05-27: qty 22

## 2018-05-27 MED ORDER — FENTANYL CITRATE (PF) 100 MCG/2ML IJ SOLN
INTRAMUSCULAR | Status: AC
  Filled 2018-05-27: qty 2

## 2018-05-27 MED ORDER — SODIUM CHLORIDE 0.9 % IV SOLN
80.0000 mg | INTRAVENOUS | Status: DC
  Filled 2018-05-27: qty 2

## 2018-05-27 MED ORDER — MIDAZOLAM HCL 5 MG/5ML IJ SOLN
INTRAMUSCULAR | Status: AC
  Filled 2018-05-27: qty 5

## 2018-05-27 MED ORDER — CHLORHEXIDINE GLUCONATE 4 % EX LIQD
60.0000 mL | Freq: Once | CUTANEOUS | Status: DC
  Filled 2018-05-27: qty 750

## 2018-05-27 MED ORDER — LIDOCAINE HCL (PF) 1 % IJ SOLN
INTRAMUSCULAR | Status: DC | PRN
  Administered 2018-05-27: 45 mL

## 2018-05-27 MED ORDER — HEPARIN (PORCINE) IN NACL 1000-0.9 UT/500ML-% IV SOLN
INTRAVENOUS | Status: AC
  Filled 2018-05-27: qty 500

## 2018-05-27 MED ORDER — SODIUM CHLORIDE 0.9 % IV SOLN
INTRAVENOUS | Status: DC
  Administered 2018-05-27: 08:00:00 via INTRAVENOUS

## 2018-05-27 SURGICAL SUPPLY — 10 items
ATTRACTOMAT 16X20 MAGNETIC DRP (DRAPES) ×2 IMPLANT
CABLE SURGICAL S-101-97-12 (CABLE) ×3 IMPLANT
DEVICE DISSECT PLASMABLAD 3.0S (MISCELLANEOUS) IMPLANT
GUIDEWIRE ANGLED .035X150CM (WIRE) ×2 IMPLANT
KIT ESSENTIALS PG (KITS) ×2 IMPLANT
PAD PRO RADIOLUCENT 2001M-C (PAD) ×3 IMPLANT
PLASMABLADE 3.0S (MISCELLANEOUS) ×3
SHEATH CLASSIC 9.5F (SHEATH) ×2 IMPLANT
TRAY PACEMAKER INSERTION (PACKS) ×3 IMPLANT
WIRE HI TORQ VERSACORE-J 145CM (WIRE) ×2 IMPLANT

## 2018-05-27 NOTE — H&P (Signed)
ICD Criteria  Current LVEF:25%. Within 12 months prior to implant: No   Heart failure history: Yes, Class III  Cardiomyopathy history: Yes, Non-Ischemic Cardiomyopathy.  Atrial Fibrillation/Atrial Flutter: Yes, Permanent.  Ventricular tachycardia history: No.  Cardiac arrest history: No.  History of syndromes with risk of sudden death: No.  Previous ICD: Yes, Reason for ICD:  Primary prevention.  Current ICD indication: Primary  PPM indication: No.  Class I or II Bradycardia indication present: No  Beta Blocker therapy for 3 or more months: Yes, prescribed.   Ace Inhibitor/ARB therapy for 3 or more months: Yes, prescribed.    I have seen Jaime Scott is a 67 y.o. malepre-procedural and has been referred by Shirlee More for consideration of CRT-D upgrade for primary prevention of sudden death.  The patient's chart has been reviewed and they meet criteria for ICD implant.  I have had a thorough discussion with the patient reviewing options.  The patient and their family (if available) have had opportunities to ask questions and have them answered. The patient and I have decided together through the [place] [employer] to implant CRT-D at this time.  Risks, benefits, alternatives to ICD implantation were discussed in detail with the patient today. The patient  understands that the risks include but are not limited to bleeding, infection, pneumothorax, perforation, tamponade, vascular damage, renal failure, MI, stroke, death, inappropriate shocks, and lead dislodgement and  wishes to proceed.

## 2018-05-27 NOTE — Discharge Instructions (Signed)
Pacemaker Battery Change, Care After This sheet gives you information about how to care for yourself after your procedure. Your health care provider may also give you more specific instructions. If you have problems or questions, contact your health care provider. What can I expect after the procedure? After your procedure, it is common to have:  Pain or soreness at the site where the pacemaker was inserted.  Swelling at the site where the pacemaker was inserted.  Follow these instructions at home: Incision care  Keep the incision clean and dry. ? Do not take baths, swim, or use a hot tub until your health care provider approves. ? You may shower 24 hours after your procedure, or as directed by your health care provider. ? Pat the area dry with a clean towel. Do not rub the area. This may cause bleeding.  Follow instructions from your health care provider about how to take care of your incision. Make sure you: ? Wash your hands with soap and water before you change your bandage (dressing). If soap and water are not available, use hand sanitizer. ? You may remove outer dressing after 24 hours. ? Leave stitches (sutures), skin glue, or adhesive strips in place. These skin closures may need to stay in place for 2 weeks or longer. If adhesive strip edges start to loosen and curl up, you may trim the loose edges. Do not remove adhesive strips completely unless your health care provider tells you to do that.  Check your incision area every day for signs of infection. Check for: ? More redness, swelling, or pain. ? More fluid or blood. ? Warmth. ? Pus or a bad smell. Activity  Do not lift anything that is heavier than 10 lb (4.5 kg) until your health care provider says it is okay to do so.  For the first 2 weeks, or as long as told by your health care provider: ? Avoid lifting your left arm higher than your shoulder. ? Be gentle when you move your arms over your head. It is okay to raise  your arm to comb your hair. ? Avoid strenuous exercise.  Ask your health care provider when it is okay to: ? Resume your normal activities. ? Return to work or school. ? Resume sexual activity. Eating and drinking  Eat a heart-healthy diet. This should include plenty of fresh fruits and vegetables, whole grains, low-fat dairy products, and lean protein like chicken and fish.  Limit alcohol intake to no more than 1 drink a day for non-pregnant women and 2 drinks a day for men. One drink equals 12 oz of beer, 5 oz of wine, or 1 oz of hard liquor.  Check ingredients and nutrition facts on packaged foods and beverages. Avoid the following types of food: ? Food that is high in salt (sodium). ? Food that is high in saturated fat, like full-fat dairy or red meat. ? Food that is high in trans fat, like fried food. ? Food and drinks that are high in sugar. Lifestyle  Do not use any products that contain nicotine or tobacco, such as cigarettes and e-cigarettes. If you need help quitting, ask your health care provider.  Take steps to manage and control your weight.  Get regular exercise. Aim for 150 minutes of moderate-intensity exercise (such as walking or yoga) or 75 minutes of vigorous exercise (such as running or swimming) each week.  Manage other health problems, such as diabetes or high blood pressure. Ask your health care provider  how you can manage these conditions. General instructions  Do not drive for 24 hours after your procedure if you were given a medicine to help you relax (sedative).  Take over-the-counter and prescription medicines only as told by your health care provider.  Avoid putting pressure on the area where the pacemaker was placed.  If you need an MRI after your pacemaker has been placed, be sure to tell the health care provider who orders the MRI that you have a pacemaker.  Avoid close and prolonged exposure to electrical devices that have strong magnetic fields.  These include: ? Cell phones. Avoid keeping them in a pocket near the pacemaker, and try using the ear opposite the pacemaker. ? MP3 players. ? Household appliances, like microwaves. ? Metal detectors. ? Electric generators. ? High-tension wires.  Keep all follow-up visits as directed by your health care provider. This is important. Contact a health care provider if:  You have pain at the incision site that is not relieved by over-the-counter or prescription medicines.  You have any of these around your incision site or coming from it: ? More redness, swelling, or pain. ? Fluid or blood. ? Warmth to the touch. ? Pus or a bad smell.  You have a fever.  You feel brief, occasional palpitations, light-headedness, or any symptoms that you think might be related to your heart. Get help right away if:  You experience chest pain that is different from the pain at the pacemaker site.  You develop a red streak that extends above or below the incision site.  You experience shortness of breath.  You have palpitations or an irregular heartbeat.  You have light-headedness that does not go away quickly.  You faint or have dizzy spells.  Your pulse suddenly drops or increases rapidly and does not return to normal.  You begin to gain weight and your legs and ankles swell. Summary  After your procedure, it is common to have pain, soreness, and some swelling where the pacemaker was inserted.  Make sure to keep your incision clean and dry. Follow instructions from your health care provider about how to take care of your incision.  Check your incision every day for signs of infection, such as more pain or swelling, pus or a bad smell, warmth, or leaking fluid and blood.  Avoid strenuous exercise and lifting your left arm higher than your shoulder for 2 weeks, or as long as told by your health care provider. This information is not intended to replace advice given to you by your health care  provider. Make sure you discuss any questions you have with your health care provider. Document Released: 05/04/2013 Document Revised: 06/05/2016 Document Reviewed: 06/05/2016 Elsevier Interactive Patient Education  2017 [employer].

## 2018-05-28 ENCOUNTER — Telehealth: Payer: Self-pay | Admitting: Cardiology

## 2018-05-28 DIAGNOSIS — I4891 Unspecified atrial fibrillation: Secondary | ICD-10-CM | POA: Diagnosis not present

## 2018-05-28 DIAGNOSIS — E119 Type 2 diabetes mellitus without complications: Secondary | ICD-10-CM | POA: Diagnosis not present

## 2018-05-28 DIAGNOSIS — Z4800 Encounter for change or removal of nonsurgical wound dressing: Secondary | ICD-10-CM | POA: Diagnosis not present

## 2018-05-28 DIAGNOSIS — I110 Hypertensive heart disease with heart failure: Secondary | ICD-10-CM | POA: Diagnosis not present

## 2018-05-28 DIAGNOSIS — I509 Heart failure, unspecified: Secondary | ICD-10-CM | POA: Diagnosis not present

## 2018-05-28 DIAGNOSIS — Z87891 Personal history of nicotine dependence: Secondary | ICD-10-CM | POA: Diagnosis not present

## 2018-05-28 MED FILL — Heparin Sod (Porcine)-NaCl IV Soln 1000 Unit/500ML-0.9%: INTRAVENOUS | Qty: 500 | Status: AC

## 2018-05-28 NOTE — Telephone Encounter (Signed)
Spoke with pt and was awakened this am with what pt thought was sweat only to see had significant bleeding from device site. Pt went to ED in [place] and bleeding was stopped and was told to call office . Will forward to device clinic staff./cy

## 2018-05-28 NOTE — Telephone Encounter (Signed)
Spoke with pt who stated that the bandage that was put on a [facility] is dry there has been no more bleeding and that he was told his INR level was high and they cut his coumadin dosage down. Informed pt that if he starts bleeding again over the weekend to go to [facility] [place] otherwise if the bleeding starts again next week then to call device clinic at [phone]. Pt voiced understanding

## 2018-05-28 NOTE — Telephone Encounter (Signed)
New Message:    Patient calling he had surgery  done by Dr, Ileana Ladd on yesterday. His power went out and when he woke up he was bleeding he went to the ER.

## 2018-05-31 ENCOUNTER — Encounter (HOSPITAL_COMMUNITY): Payer: Self-pay | Admitting: Cardiology

## 2018-05-31 DIAGNOSIS — Z7901 Long term (current) use of anticoagulants: Secondary | ICD-10-CM | POA: Diagnosis not present

## 2018-05-31 DIAGNOSIS — Z23 Encounter for immunization: Secondary | ICD-10-CM | POA: Diagnosis not present

## 2018-05-31 DIAGNOSIS — E1169 Type 2 diabetes mellitus with other specified complication: Secondary | ICD-10-CM | POA: Diagnosis not present

## 2018-05-31 DIAGNOSIS — I10 Essential (primary) hypertension: Secondary | ICD-10-CM | POA: Diagnosis not present

## 2018-06-03 ENCOUNTER — Telehealth: Payer: Self-pay | Admitting: *Deleted

## 2018-06-03 NOTE — Telephone Encounter (Signed)
lmtcb to inform pt AVN ablation being cancelled for now, pending possible lead extraction w/ Dr. Lovena Le and then re-attempt to upgrade device to BiV.

## 2018-06-04 NOTE — Telephone Encounter (Signed)
Pt returned my call. Pt made aware AVN ablation scheduled for 11/29 cancelled d/t need for lead extraction and reschedule upgrade once extraction has been completed. Pt aware we will contact him once time to reschedule procedure. Pt verbalized understanding.

## 2018-06-09 ENCOUNTER — Telehealth: Payer: Self-pay | Admitting: Cardiology

## 2018-06-09 NOTE — Telephone Encounter (Signed)
New Message: ° °Patient returning call back °

## 2018-06-10 ENCOUNTER — Ambulatory Visit: Payer: Medicare Other

## 2018-06-10 DIAGNOSIS — E1169 Type 2 diabetes mellitus with other specified complication: Secondary | ICD-10-CM | POA: Diagnosis not present

## 2018-06-10 DIAGNOSIS — I4891 Unspecified atrial fibrillation: Secondary | ICD-10-CM | POA: Diagnosis not present

## 2018-06-17 ENCOUNTER — Telehealth: Payer: Self-pay | Admitting: Cardiology

## 2018-06-17 NOTE — Telephone Encounter (Signed)
Patient is returning you call. He states you called him a couple of days ago.

## 2018-06-17 NOTE — Telephone Encounter (Signed)
Explained that we will readdress ablation once ICD lead has been addressed.  He is scheduled to see Dr. Lovena Le next week to discuss lead extraction.  Pt agreeable to plan.

## 2018-06-23 ENCOUNTER — Ambulatory Visit: Payer: Medicare Other | Admitting: Internal Medicine

## 2018-06-25 SURGERY — AV NODE ABLATION

## 2018-06-29 DIAGNOSIS — I4891 Unspecified atrial fibrillation: Secondary | ICD-10-CM | POA: Diagnosis not present

## 2018-06-30 DIAGNOSIS — Z7901 Long term (current) use of anticoagulants: Secondary | ICD-10-CM | POA: Diagnosis not present

## 2018-07-13 ENCOUNTER — Encounter: Payer: Self-pay | Admitting: Internal Medicine

## 2018-07-13 ENCOUNTER — Ambulatory Visit (INDEPENDENT_AMBULATORY_CARE_PROVIDER_SITE_OTHER): Payer: Medicare Other | Admitting: Internal Medicine

## 2018-07-13 VITALS — BP 106/72 | HR 97 | Ht 67.0 in | Wt 230.0 lb

## 2018-07-13 DIAGNOSIS — I5022 Chronic systolic (congestive) heart failure: Secondary | ICD-10-CM | POA: Diagnosis not present

## 2018-07-13 DIAGNOSIS — I428 Other cardiomyopathies: Secondary | ICD-10-CM | POA: Diagnosis not present

## 2018-07-13 DIAGNOSIS — Z95810 Presence of automatic (implantable) cardiac defibrillator: Secondary | ICD-10-CM

## 2018-07-13 DIAGNOSIS — I447 Left bundle-branch block, unspecified: Secondary | ICD-10-CM | POA: Diagnosis not present

## 2018-07-13 NOTE — Patient Instructions (Addendum)
Medication Instructions:  Your physician recommends that you continue on your current medications as directed. Please refer to the Current Medication list given to you today.  Labwork: You will get lab work today:  BMP and CBC.  Testing/Procedures:  A biventricular pacemaker/ICD implantation is a procedure to place (implant) a pacemaker into both of the lower chambers (ventricles) of the heart. A pacemaker is a small, battery-powered device that helps control the heartbeat. If the heart beats irregularly or too slowly (bradycardia), the pacemaker will pace the heart so that it beats at a normal rate or a programmed rate. The parts of a biventricular pacemaker include:  The pulse generator. The pulse generator contains a small computer and a memory system that is programmed to keep the heart beating at a certain rate. The pulse generator also produces the electrical signal that triggers the heart to beat. This is implanted under the skin of the upper chest, near the collarbone.  Wires (leads). The leads are placed in the left and right ventricles of the heart. The leads are connected to the pulse generator. They transmit electrical pulses from the pulse generator to the heart.     PROCEDURE INSTRUCTIONS:  Please arrive to ADMITTING down the hall from the [facility] main entrance of [facility] at:  xxx  Use the CHG surgical scrub as directed  I will give you directions on eating/drinking when I get confirmation of your surgery date/time  You may take your normal morning medications except for:  Lasix, metformin, potassium  Do NOT TAKE your WARFARIN for 2 days prior to your procedure (I will give you the last date you will take it when I get you scheduled)  Plan for one night stay  You will need someone to drive you home at discharge  If you need a refill on your cardiac medications before your next appointment, please call your pharmacy.   The following days are available  for procedures:   January 3, 6, 9

## 2018-07-13 NOTE — Progress Notes (Signed)
HPI Jaime Scott is referred today by Dr. Curt Bears for evaluation of an occluded left SCV in the setting of need for a biv ICD upgrade. He is a pleasant 67 yo man with chronic systolic heart failure, LBBB, s/p ICD insertion. He underwent attempted biv insertion 2 years ago with the procedure complicated by dissection of his CS. He then had another attempt by Dr. Curt Bears and was found to have an occluded left scv and the procedure was complicated by bleeding after and a visit to the ED where his bleeding eventually stopped. He has healed. He has class 3 dyspnea. No ICD shocks.  Allergies  Allergen Reactions  . Ivp Dye [Iodinated Diagnostic Agents] Other (See Comments)    Constipation x 7 days     Current Outpatient Medications  Medication Sig Dispense Refill  . carvedilol (COREG) 6.25 MG tablet Take 6.25 mg by mouth 2 (two) times daily with a meal.    . digoxin (LANOXIN) 0.125 MG tablet Take 0.125 mg by mouth daily.    Marland Kitchen diltiazem (CARDIZEM) 30 MG tablet Take 30 mg by mouth 2 (two) times daily.    . diphenhydrAMINE (BENADRYL) 25 MG tablet Take 25 mg by mouth as needed for allergies.    Marland Kitchen enalapril (VASOTEC) 5 MG tablet Take 1 tablet (5 mg total) by mouth daily. 180 tablet 3  . furosemide (LASIX) 80 MG tablet Take 1.5 tablets (120 mg) in the morning, and 1 tablet (80mg ) in the evening (Patient taking differently: Take 80-120 mg by mouth See admin instructions. Take 1.5 tablets (120 mg) in the morning, and 1 tablet (80mg ) in the evening) 75 tablet 5  . ipratropium (ATROVENT) 0.02 % nebulizer solution Take 0.5 mg by nebulization every 6 (six) hours as needed for wheezing or shortness of breath.    . metFORMIN (GLUCOPHAGE-XR) 500 MG 24 hr tablet Take 1,000 mg by mouth 2 (two) times daily.     . potassium chloride SA (K-DUR,KLOR-CON) 20 MEQ tablet Take 20 mEq by mouth daily.    . predniSONE (DELTASONE) 50 MG tablet Take 1 tablet (50 mg total) 13 hours, 7 hours and 1 hour prior to your  procedure. 3 tablet 0  . simvastatin (ZOCOR) 20 MG tablet Take 20 mg by mouth daily.    Marland Kitchen warfarin (COUMADIN) 6 MG tablet Take 6 mg by mouth daily at 6 PM.      No current facility-administered medications for this visit.      Past Medical History:  Diagnosis Date  . Alcohol abuse   . CHF (congestive heart failure) ([place]) 03/12/2017  . Chronic atrial fibrillation 03/12/2017  . COPD (chronic obstructive pulmonary disease) ([place]) 03/12/2017  . Essential hypertension 03/12/2017  . Hyperlipidemia 03/12/2017  . Ischemic cardiomyopathy 03/12/2017  . Long term (current) use of anticoagulants 03/12/2017  . Peripheral vascular disease ([place]) 03/12/2017  . 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 Type 2 diabetes mellitus without complications ([place]) 3/81/8299    ROS:   All systems reviewed and negative except as noted in the HPI.   Past Surgical History:  Procedure Laterality Date  . BIV PACEMAKER GENERATOR CHANGEOUT N/A 05/27/2018   Procedure: BIV PACEMAKER GENERATOR UPGRADE - Aborted;  Surgeon: Constance Haw, MD;  Location: [facility];  Service: Cardiovascular;  Laterality: N/A;  . CHOLECYSTECTOMY    . ERCP     stone removal  . EXCISION OF  BREAST BIOPSY     Age 23, benign     Family History  Problem Relation Age of Onset  . Breast cancer Sister   . Alcohol abuse Brother   . Alcohol abuse Brother      Social History   Socioeconomic History  . Marital status: Divorced    Spouse name: Not on file  . Number of children: Not on file  . Years of education: Not on file  . Highest education level: Not on file  Occupational History  . Not on file  Social Needs  . Financial resource strain: Not on file  . Food insecurity:    Worry: Not on file    Inability: Not on file  . Transportation needs:    Medical: Not on file    Non-medical: Not on file  Tobacco Use  . Smoking status: Current Every  Day Smoker    Packs/day: 1.00    Types: Cigarettes  . Smokeless tobacco: Never Used  Substance and Sexual Activity  . Alcohol use: No  . Drug use: No  . Sexual activity: Not on file  Lifestyle  . Physical activity:    Days per week: Not on file    Minutes per session: Not on file  . Stress: Not on file  Relationships  . Social connections:    Talks on phone: Not on file    Gets together: Not on file    Attends religious service: Not on file    Active member of club or organization: Not on file    Attends meetings of clubs or organizations: Not on file    Relationship status: Not on file  . Intimate partner violence:    Fear of current or ex partner: Not on file    Emotionally abused: Not on file    Physically abused: Not on file    Forced sexual activity: Not on file  Other Topics Concern  . Not on file  Social History Narrative  . Not on file     BP 106/72   Pulse 97   Ht 5\' 7"  (1.702 m)   Wt 230 lb (104.3 kg)   SpO2 97%   BMI 36.02 kg/m   Physical Exam:  Well appearing NAD HEENT: Unremarkable Neck:  No JVD, no thyromegally Lymphatics:  No adenopathy Back:  No CVA tenderness Lungs:  Clear with no wheezes HEART:  Regular rate rhythm, no murmurs, no rubs, no clicks Abd:  soft, positive bowel sounds, no organomegally, no rebound, no guarding Ext:  2 plus pulses, no edema, no cyanosis, no clubbing Skin:  No rashes no nodules Neuro:  CN II through XII intact, motor grossly intact  EKG - reviewed nsr with LBBB  DEVICE  Normal device function.  See PaceArt for details.   Assess/Plan: 1. Chronic systolic heart failure -his symptoms are class 3. He will continue his current meds and we will try and arrange biv upgrade. 2. ICD - he has a biv device in place with the atrial and LV port capped. I have recommended removal of his ICD lead, gaining access to the central circulation using the old ICD lead. I have reviewed the  indications/risks/benefits/goals/expectations of the procedure with the patient and he wishes to proceed. 3. COPD - he is using oxygen. Stable. He is encouraged to stop smoking. 4.  PAF - once his device is in place, he will be able to be treated more aggressively as we will know how much of the time he  is in atrial fib.  [name].D.

## 2018-07-13 NOTE — H&P (View-Only) (Signed)
HPI [name] is referred today by Dr. Curt Bears for evaluation of an occluded left SCV in the setting of need for a biv ICD upgrade. He is a pleasant 67 yo man with chronic systolic heart failure, LBBB, s/p ICD insertion. He underwent attempted biv insertion 2 years ago with the procedure complicated by dissection of his CS. He then had another attempt by Dr. Curt Bears and was found to have an occluded left scv and the procedure was complicated by bleeding after and a visit to the ED where his bleeding eventually stopped. He has healed. He has class 3 dyspnea. No ICD shocks.  Allergies  Allergen Reactions  . Ivp Dye [Iodinated Diagnostic Agents] Other (See Comments)    Constipation x 7 days     Current Outpatient Medications  Medication Sig Dispense Refill  . carvedilol (COREG) 6.25 MG tablet Take 6.25 mg by mouth 2 (two) times daily with a meal.    . digoxin (LANOXIN) 0.125 MG tablet Take 0.125 mg by mouth daily.    Marland Kitchen diltiazem (CARDIZEM) 30 MG tablet Take 30 mg by mouth 2 (two) times daily.    . diphenhydrAMINE (BENADRYL) 25 MG tablet Take 25 mg by mouth as needed for allergies.    Marland Kitchen enalapril (VASOTEC) 5 MG tablet Take 1 tablet (5 mg total) by mouth daily. 180 tablet 3  . furosemide (LASIX) 80 MG tablet Take 1.5 tablets (120 mg) in the morning, and 1 tablet (80mg ) in the evening (Patient taking differently: Take 80-120 mg by mouth See admin instructions. Take 1.5 tablets (120 mg) in the morning, and 1 tablet (80mg ) in the evening) 75 tablet 5  . ipratropium (ATROVENT) 0.02 % nebulizer solution Take 0.5 mg by nebulization every 6 (six) hours as needed for wheezing or shortness of breath.    . metFORMIN (GLUCOPHAGE-XR) 500 MG 24 hr tablet Take 1,000 mg by mouth 2 (two) times daily.     . potassium chloride SA (K-DUR,KLOR-CON) 20 MEQ tablet Take 20 mEq by mouth daily.    . predniSONE (DELTASONE) 50 MG tablet Take 1 tablet (50 mg total) 13 hours, 7 hours and 1 hour prior to your  procedure. 3 tablet 0  . simvastatin (ZOCOR) 20 MG tablet Take 20 mg by mouth daily.    Marland Kitchen warfarin (COUMADIN) 6 MG tablet Take 6 mg by mouth daily at 6 PM.      No current facility-administered medications for this visit.      Past Medical History:  Diagnosis Date  . Alcohol abuse   . CHF (congestive heart failure) ([place]) 03/12/2017  . Chronic atrial fibrillation 03/12/2017  . COPD (chronic obstructive pulmonary disease) ([place]) 03/12/2017  . Essential hypertension 03/12/2017  . Hyperlipidemia 03/12/2017  . Ischemic cardiomyopathy 03/12/2017  . Long term (current) use of anticoagulants 03/12/2017  . Peripheral vascular disease ([place]) 03/12/2017  . 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 Type 2 diabetes mellitus without complications ([place]) 9/82/6415    ROS:   All systems reviewed and negative except as noted in the HPI.   Past Surgical History:  Procedure Laterality Date  . BIV PACEMAKER GENERATOR CHANGEOUT N/A 05/27/2018   Procedure: BIV PACEMAKER GENERATOR UPGRADE - Aborted;  Surgeon: Constance Haw, MD;  Location: [facility];  Service: Cardiovascular;  Laterality: N/A;  . CHOLECYSTECTOMY    . ERCP     stone removal  . EXCISION OF  BREAST BIOPSY     Age 82, benign     Family History  Problem Relation Age of Onset  . Breast cancer Sister   . Alcohol abuse Brother   . Alcohol abuse Brother      Social History   Socioeconomic History  . Marital status: Divorced    Spouse name: Not on file  . Number of children: Not on file  . Years of education: Not on file  . Highest education level: Not on file  Occupational History  . Not on file  Social Needs  . Financial resource strain: Not on file  . Food insecurity:    Worry: Not on file    Inability: Not on file  . Transportation needs:    Medical: Not on file    Non-medical: Not on file  Tobacco Use  . Smoking status: Current Every  Day Smoker    Packs/day: 1.00    Types: Cigarettes  . Smokeless tobacco: Never Used  Substance and Sexual Activity  . Alcohol use: No  . Drug use: No  . Sexual activity: Not on file  Lifestyle  . Physical activity:    Days per week: Not on file    Minutes per session: Not on file  . Stress: Not on file  Relationships  . Social connections:    Talks on phone: Not on file    Gets together: Not on file    Attends religious service: Not on file    Active member of club or organization: Not on file    Attends meetings of clubs or organizations: Not on file    Relationship status: Not on file  . Intimate partner violence:    Fear of current or ex partner: Not on file    Emotionally abused: Not on file    Physically abused: Not on file    Forced sexual activity: Not on file  Other Topics Concern  . Not on file  Social History Narrative  . Not on file     BP 106/72   Pulse 97   Ht 5\' 7"  (1.702 m)   Wt 230 lb (104.3 kg)   SpO2 97%   BMI 36.02 kg/m   Physical Exam:  Well appearing NAD HEENT: Unremarkable Neck:  No JVD, no thyromegally Lymphatics:  No adenopathy Back:  No CVA tenderness Lungs:  Clear with no wheezes HEART:  Regular rate rhythm, no murmurs, no rubs, no clicks Abd:  soft, positive bowel sounds, no organomegally, no rebound, no guarding Ext:  2 plus pulses, no edema, no cyanosis, no clubbing Skin:  No rashes no nodules Neuro:  CN II through XII intact, motor grossly intact  EKG - reviewed nsr with LBBB  DEVICE  Normal device function.  See PaceArt for details.   Assess/Plan: 1. Chronic systolic heart failure -his symptoms are class 3. He will continue his current meds and we will try and arrange biv upgrade. 2. ICD - he has a biv device in place with the atrial and LV port capped. I have recommended removal of his ICD lead, gaining access to the central circulation using the old ICD lead. I have reviewed the  indications/risks/benefits/goals/expectations of the procedure with the patient and he wishes to proceed. 3. COPD - he is using oxygen. Stable. He is encouraged to stop smoking. 4.  PAF - once his device is in place, he will be able to be treated more aggressively as we will know how much of the time he  is in atrial fib.  [name].D.

## 2018-07-14 LAB — CBC WITH DIFFERENTIAL/PLATELET
Basophils Absolute: 0.1 10*3/uL (ref 0.0–0.2)
Basos: 1 %
EOS (ABSOLUTE): 0.2 10*3/uL (ref 0.0–0.4)
Eos: 2 %
Hematocrit: 41.1 % (ref 37.5–51.0)
Hemoglobin: 13.9 g/dL (ref 13.0–17.7)
IMMATURE GRANS (ABS): 0 10*3/uL (ref 0.0–0.1)
IMMATURE GRANULOCYTES: 0 %
LYMPHS: 22 %
Lymphocytes Absolute: 2.5 10*3/uL (ref 0.7–3.1)
MCH: 30.5 pg (ref 26.6–33.0)
MCHC: 33.8 g/dL (ref 31.5–35.7)
MCV: 90 fL (ref 79–97)
MONOCYTES: 11 %
MONOS ABS: 1.3 10*3/uL — AB (ref 0.1–0.9)
NEUTROS PCT: 64 %
Neutrophils Absolute: 7.1 10*3/uL — ABNORMAL HIGH (ref 1.4–7.0)
Platelets: 324 10*3/uL (ref 150–450)
RBC: 4.55 x10E6/uL (ref 4.14–5.80)
RDW: 12.6 % (ref 12.3–15.4)
WBC: 11.1 10*3/uL — AB (ref 3.4–10.8)

## 2018-07-14 LAB — BASIC METABOLIC PANEL
BUN/Creatinine Ratio: 13 (ref 10–24)
BUN: 9 mg/dL (ref 8–27)
CALCIUM: 9 mg/dL (ref 8.6–10.2)
CO2: 33 mmol/L — AB (ref 20–29)
CREATININE: 0.72 mg/dL — AB (ref 0.76–1.27)
Chloride: 91 mmol/L — ABNORMAL LOW (ref 96–106)
GFR calc Af Amer: 112 mL/min/{1.73_m2} (ref >59)
GFR, EST NON AFRICAN AMERICAN: 97 mL/min/{1.73_m2} (ref >59)
GLUCOSE: 120 mg/dL — AB (ref 65–99)
Potassium: 3.8 mmol/L (ref 3.5–5.2)
SODIUM: 141 mmol/L (ref 134–144)

## 2018-07-16 ENCOUNTER — Telehealth: Payer: Self-pay

## 2018-07-16 NOTE — Telephone Encounter (Signed)
Call placed to Pt.  Advised procedure was scheduled for July 30, 2018 at 3:00 pm.  Pt advised to arrive to Admitting at [facility] hospital at 1:00 pm.  Advised Pt he could have a light breakfast prior to 7:00 am, nothing after that.  Advised Pt to hold warfarin for THREE days.  Pt has IV contrast listed as a "low" allergy.  States Pt has had constipation after exposure.  Per Pt he is not really "allergic".  States he has been exposed since it was listed as allergy with no difficulties.  No pre treatment needed.

## 2018-07-22 ENCOUNTER — Ambulatory Visit (INDEPENDENT_AMBULATORY_CARE_PROVIDER_SITE_OTHER): Payer: Medicare Other

## 2018-07-22 DIAGNOSIS — I428 Other cardiomyopathies: Secondary | ICD-10-CM

## 2018-07-22 DIAGNOSIS — I5022 Chronic systolic (congestive) heart failure: Secondary | ICD-10-CM

## 2018-07-22 LAB — CUP PACEART REMOTE DEVICE CHECK
Battery Voltage: 3.01 V
Brady Statistic AP VP Percent: 0 %
Brady Statistic AP VS Percent: 0 %
Brady Statistic AS VP Percent: 0 %
Brady Statistic AS VS Percent: 0 %
Brady Statistic RA Percent Paced: 0 %
Brady Statistic RV Percent Paced: 0.44 %
Date Time Interrogation Session: 20191226083425
HighPow Impedance: 71 Ohm
Implantable Lead Implant Date: 20170410
Implantable Lead Location: 753860
Implantable Lead Model: 6935
Lead Channel Impedance Value: 342 Ohm
Lead Channel Pacing Threshold Amplitude: 0.625 V
Lead Channel Pacing Threshold Pulse Width: 0.4 ms
Lead Channel Sensing Intrinsic Amplitude: 17.125 mV
Lead Channel Sensing Intrinsic Amplitude: 17.125 mV
Lead Channel Setting Pacing Pulse Width: 0.4 ms
MDC IDC MSMT BATTERY REMAINING LONGEVITY: 106 mo
MDC IDC MSMT LEADCHNL RV IMPEDANCE VALUE: 285 Ohm
MDC IDC PG IMPLANT DT: 20170410
MDC IDC SET LEADCHNL RV PACING AMPLITUDE: 2 V
MDC IDC SET LEADCHNL RV SENSING SENSITIVITY: 0.3 mV

## 2018-07-29 ENCOUNTER — Encounter (HOSPITAL_COMMUNITY): Payer: Self-pay | Admitting: *Deleted

## 2018-07-29 ENCOUNTER — Other Ambulatory Visit: Payer: Self-pay

## 2018-07-29 MED ORDER — SODIUM CHLORIDE 0.9 % IV SOLN
INTRAVENOUS | Status: AC
  Administered 2018-07-30: 500 mL
  Filled 2018-07-29: qty 80

## 2018-07-29 NOTE — Progress Notes (Signed)
Spoke with pt for pre-op call. Pt has hx of atria fib, CHF and has a pacemaker (no leads connected at this time). Dr. Curt Bears is his cardiologist. Pt denies any recent chest pain. Pt is on Coumadin, was instructed to stop Coumadin 3 days prior to surgery. Last dose was 07/27/18. States he also has COPD and chronic SOB. He is wearing 3 L of Oxygen 24/7. States he's had a 15 lb weight gain in the past 2 weeks, states his Lasix has been increased. Pt is a type 2 diabetic. States he doesn't know what his A1C is, states Dr. Burnett Sheng "keeps up with that". States he does not check his blood sugar at home, but does have a meter to do so if needed. Instructed pt to check his blood sugar in the morning when he gets up and every 2 hours until he leaves for the hospital. If blood sugar is 70 or below, treat with 1/2 cup of clear juice (apple or cranberry) and recheck blood sugar 15 minutes after drinking juice. If blood sugar continues to be 70 or below, call the Short Stay department and ask to speak to a nurse. He voiced understanding.  I called Dr. Florina Ou office and pt's last A1C was 7.1 on 06/10/18.

## 2018-07-30 ENCOUNTER — Ambulatory Visit (HOSPITAL_COMMUNITY): Payer: Medicare Other | Admitting: Certified Registered"

## 2018-07-30 ENCOUNTER — Encounter (HOSPITAL_COMMUNITY): Payer: Self-pay | Admitting: *Deleted

## 2018-07-30 ENCOUNTER — Inpatient Hospital Stay (HOSPITAL_COMMUNITY)
Admission: RE | Admit: 2018-07-30 | Discharge: 2018-08-03 | DRG: 226 | Disposition: A | Discharge status: Home / Self Care | Payer: Medicare Other | Attending: Internal Medicine | Admitting: Internal Medicine

## 2018-07-30 ENCOUNTER — Encounter (HOSPITAL_COMMUNITY)
Admission: RE | Disposition: A | Discharge status: Home / Self Care | Payer: Self-pay | Source: Home / Self Care | Attending: Internal Medicine

## 2018-07-30 DIAGNOSIS — I110 Hypertensive heart disease with heart failure: Secondary | ICD-10-CM | POA: Diagnosis not present

## 2018-07-30 DIAGNOSIS — E669 Obesity, unspecified: Secondary | ICD-10-CM | POA: Diagnosis present

## 2018-07-30 DIAGNOSIS — J9622 Acute and chronic respiratory failure with hypercapnia: Secondary | ICD-10-CM | POA: Diagnosis not present

## 2018-07-30 DIAGNOSIS — Z9119 Patient's noncompliance with other medical treatment and regimen: Secondary | ICD-10-CM

## 2018-07-30 DIAGNOSIS — E785 Hyperlipidemia, unspecified: Secondary | ICD-10-CM | POA: Diagnosis present

## 2018-07-30 DIAGNOSIS — E1151 Type 2 diabetes mellitus with diabetic peripheral angiopathy without gangrene: Secondary | ICD-10-CM | POA: Diagnosis present

## 2018-07-30 DIAGNOSIS — Z6838 Body mass index (BMI) 38.0-38.9, adult: Secondary | ICD-10-CM

## 2018-07-30 DIAGNOSIS — Z811 Family history of alcohol abuse and dependence: Secondary | ICD-10-CM

## 2018-07-30 DIAGNOSIS — I82B22 Chronic embolism and thrombosis of left subclavian vein: Principal | ICD-10-CM | POA: Diagnosis present

## 2018-07-30 DIAGNOSIS — I4821 Permanent atrial fibrillation: Secondary | ICD-10-CM | POA: Diagnosis present

## 2018-07-30 DIAGNOSIS — E1165 Type 2 diabetes mellitus with hyperglycemia: Secondary | ICD-10-CM | POA: Diagnosis not present

## 2018-07-30 DIAGNOSIS — Z45010 Encounter for checking and testing of cardiac pacemaker pulse generator [battery]: Secondary | ICD-10-CM | POA: Diagnosis not present

## 2018-07-30 DIAGNOSIS — J449 Chronic obstructive pulmonary disease, unspecified: Secondary | ICD-10-CM | POA: Diagnosis present

## 2018-07-30 DIAGNOSIS — F17210 Nicotine dependence, cigarettes, uncomplicated: Secondary | ICD-10-CM | POA: Diagnosis present

## 2018-07-30 DIAGNOSIS — Z803 Family history of malignant neoplasm of breast: Secondary | ICD-10-CM

## 2018-07-30 DIAGNOSIS — I428 Other cardiomyopathies: Secondary | ICD-10-CM | POA: Diagnosis not present

## 2018-07-30 DIAGNOSIS — E872 Acidosis: Secondary | ICD-10-CM | POA: Diagnosis not present

## 2018-07-30 DIAGNOSIS — Z9981 Dependence on supplemental oxygen: Secondary | ICD-10-CM

## 2018-07-30 DIAGNOSIS — Z91041 Radiographic dye allergy status: Secondary | ICD-10-CM

## 2018-07-30 DIAGNOSIS — Z79899 Other long term (current) drug therapy: Secondary | ICD-10-CM

## 2018-07-30 DIAGNOSIS — Z95810 Presence of automatic (implantable) cardiac defibrillator: Secondary | ICD-10-CM

## 2018-07-30 DIAGNOSIS — J9610 Chronic respiratory failure, unspecified whether with hypoxia or hypercapnia: Secondary | ICD-10-CM

## 2018-07-30 DIAGNOSIS — I5022 Chronic systolic (congestive) heart failure: Secondary | ICD-10-CM | POA: Diagnosis not present

## 2018-07-30 DIAGNOSIS — I255 Ischemic cardiomyopathy: Secondary | ICD-10-CM | POA: Diagnosis present

## 2018-07-30 DIAGNOSIS — Z9049 Acquired absence of other specified parts of digestive tract: Secondary | ICD-10-CM

## 2018-07-30 DIAGNOSIS — I447 Left bundle-branch block, unspecified: Secondary | ICD-10-CM | POA: Diagnosis not present

## 2018-07-30 DIAGNOSIS — J9611 Chronic respiratory failure with hypoxia: Secondary | ICD-10-CM | POA: Diagnosis not present

## 2018-07-30 DIAGNOSIS — Z45018 Encounter for adjustment and management of other part of cardiac pacemaker: Secondary | ICD-10-CM | POA: Diagnosis not present

## 2018-07-30 DIAGNOSIS — Z7984 Long term (current) use of oral hypoglycemic drugs: Secondary | ICD-10-CM

## 2018-07-30 DIAGNOSIS — I4891 Unspecified atrial fibrillation: Secondary | ICD-10-CM | POA: Diagnosis present

## 2018-07-30 DIAGNOSIS — Z4502 Encounter for adjustment and management of automatic implantable cardiac defibrillator: Secondary | ICD-10-CM | POA: Diagnosis not present

## 2018-07-30 DIAGNOSIS — I482 Chronic atrial fibrillation, unspecified: Secondary | ICD-10-CM | POA: Diagnosis not present

## 2018-07-30 DIAGNOSIS — Z7901 Long term (current) use of anticoagulants: Secondary | ICD-10-CM

## 2018-07-30 HISTORY — DX: Presence of automatic (implantable) cardiac defibrillator: Z95.810

## 2018-07-30 HISTORY — DX: Dyspnea, unspecified: R06.00

## 2018-07-30 HISTORY — DX: Unspecified osteoarthritis, unspecified site: M19.90

## 2018-07-30 HISTORY — PX: BIV ICD INSERTION CRT-D: EP1195

## 2018-07-30 HISTORY — PX: PACEMAKER LEAD REMOVAL: SHX5064

## 2018-07-30 LAB — CBC WITH DIFFERENTIAL/PLATELET
Abs Immature Granulocytes: 0.03 10*3/uL (ref 0.00–0.07)
Basophils Absolute: 0.1 10*3/uL (ref 0.0–0.1)
Basophils Relative: 1 %
Eosinophils Absolute: 0.2 10*3/uL (ref 0.0–0.5)
Eosinophils Relative: 2 %
HCT: 41.7 % (ref 39.0–52.0)
Hemoglobin: 12.9 g/dL — ABNORMAL LOW (ref 13.0–17.0)
Immature Granulocytes: 0 %
Lymphocytes Relative: 23 %
Lymphs Abs: 1.8 10*3/uL (ref 0.7–4.0)
MCH: 29.2 pg (ref 26.0–34.0)
MCHC: 30.9 g/dL (ref 30.0–36.0)
MCV: 94.3 fL (ref 80.0–100.0)
Monocytes Absolute: 0.9 10*3/uL (ref 0.1–1.0)
Monocytes Relative: 11 %
Neutro Abs: 5 10*3/uL (ref 1.7–7.7)
Neutrophils Relative %: 63 %
Platelets: 260 10*3/uL (ref 150–400)
RBC: 4.42 MIL/uL (ref 4.22–5.81)
RDW: 12.5 % (ref 11.5–15.5)
WBC: 7.9 10*3/uL (ref 4.0–10.5)
nRBC: 0 % (ref 0.0–0.2)

## 2018-07-30 LAB — BASIC METABOLIC PANEL
Anion gap: 8 (ref 5–15)
BUN: 7 mg/dL — AB (ref 8–23)
CO2: 29 mmol/L (ref 22–32)
CREATININE: 0.67 mg/dL (ref 0.61–1.24)
Calcium: 8.7 mg/dL — ABNORMAL LOW (ref 8.9–10.3)
Chloride: 99 mmol/L (ref 98–111)
GFR calc Af Amer: >60 mL/min (ref >60)
GFR calc non Af Amer: >60 mL/min (ref >60)
Glucose, Bld: 144 mg/dL — ABNORMAL HIGH (ref 70–99)
Potassium: 3.6 mmol/L (ref 3.5–5.1)
Sodium: 136 mmol/L (ref 135–145)

## 2018-07-30 LAB — GLUCOSE, CAPILLARY
Glucose-Capillary: 128 mg/dL — ABNORMAL HIGH (ref 70–99)
Glucose-Capillary: 167 mg/dL — ABNORMAL HIGH (ref 70–99)
Glucose-Capillary: 176 mg/dL — ABNORMAL HIGH (ref 70–99)

## 2018-07-30 LAB — SURGICAL PCR SCREEN
MRSA, PCR: NEGATIVE
Staphylococcus aureus: NEGATIVE

## 2018-07-30 LAB — PROTIME-INR
INR: 1.17
Prothrombin Time: 14.8 seconds (ref 11.4–15.2)

## 2018-07-30 LAB — PREPARE RBC (CROSSMATCH)

## 2018-07-30 LAB — ABO/RH: ABO/RH(D): A POS

## 2018-07-30 SURGERY — REMOVAL, ELECTRODE LEAD, CARDIAC PACEMAKER, WITHOUT REPLACEMENT
Anesthesia: General | Site: Chest

## 2018-07-30 MED ORDER — LIDOCAINE HCL (PF) 1 % IJ SOLN
INTRAMUSCULAR | Status: DC | PRN
  Administered 2018-07-30 (×2): 30 mL

## 2018-07-30 MED ORDER — SODIUM CHLORIDE 0.9 % IV SOLN
INTRAVENOUS | Status: DC | PRN
  Administered 2018-07-30 (×2): via INTRAVENOUS

## 2018-07-30 MED ORDER — FENTANYL CITRATE (PF) 100 MCG/2ML IJ SOLN: 25.0000 ug | INTRAMUSCULAR | Status: DC | PRN

## 2018-07-30 MED ORDER — HEPARIN SODIUM (PORCINE) 1000 UNIT/ML IJ SOLN
INTRAMUSCULAR | Status: AC
  Filled 2018-07-30: qty 1

## 2018-07-30 MED ORDER — SODIUM CHLORIDE 0.9 % IV SOLN
INTRAVENOUS | Status: DC | PRN
  Administered 2018-07-30: 500 mL

## 2018-07-30 MED ORDER — DILTIAZEM HCL 60 MG PO TABS
30.0000 mg | ORAL_TABLET | Freq: Two times a day (BID) | ORAL | Status: DC
  Administered 2018-07-31 (×2): 30 mg via ORAL
  Filled 2018-07-30 (×2): qty 1

## 2018-07-30 MED ORDER — ONDANSETRON HCL 4 MG/2ML IJ SOLN
INTRAMUSCULAR | Status: AC
  Filled 2018-07-30: qty 2

## 2018-07-30 MED ORDER — SODIUM CHLORIDE 0.9 % IV SOLN: 80.0000 mg | INTRAVENOUS | Status: DC

## 2018-07-30 MED ORDER — PHENYLEPHRINE 40 MCG/ML (10ML) SYRINGE FOR IV PUSH (FOR BLOOD PRESSURE SUPPORT)
PREFILLED_SYRINGE | INTRAVENOUS | Status: AC
  Filled 2018-07-30: qty 10

## 2018-07-30 MED ORDER — PROPOFOL 10 MG/ML IV BOLUS
INTRAVENOUS | Status: AC
  Filled 2018-07-30: qty 20

## 2018-07-30 MED ORDER — SODIUM CHLORIDE 0.9 % IV SOLN
INTRAVENOUS | Status: AC
  Filled 2018-07-30: qty 1.2

## 2018-07-30 MED ORDER — EPHEDRINE 5 MG/ML INJ
INTRAVENOUS | Status: AC
  Filled 2018-07-30: qty 10

## 2018-07-30 MED ORDER — SODIUM CHLORIDE 0.9 % IV SOLN
INTRAVENOUS | Status: DC | PRN
  Administered 2018-07-30: 14:00:00 via INTRAVENOUS

## 2018-07-30 MED ORDER — NALOXONE HCL 0.4 MG/ML IJ SOLN
INTRAMUSCULAR | Status: AC
  Filled 2018-07-30: qty 1

## 2018-07-30 MED ORDER — SODIUM CHLORIDE 0.9% IV SOLUTION: Freq: Once | INTRAVENOUS | Status: DC

## 2018-07-30 MED ORDER — DEXAMETHASONE SODIUM PHOSPHATE 10 MG/ML IJ SOLN
INTRAMUSCULAR | Status: DC | PRN
  Administered 2018-07-30: 10 mg via INTRAVENOUS

## 2018-07-30 MED ORDER — DEXAMETHASONE SODIUM PHOSPHATE 10 MG/ML IJ SOLN
INTRAMUSCULAR | Status: AC
  Filled 2018-07-30: qty 1

## 2018-07-30 MED ORDER — WARFARIN - PHYSICIAN DOSING INPATIENT: Freq: Every day | Status: DC

## 2018-07-30 MED ORDER — IOPAMIDOL (ISOVUE-370) INJECTION 76%
INTRAVENOUS | Status: AC
  Filled 2018-07-30: qty 50

## 2018-07-30 MED ORDER — ONDANSETRON HCL 4 MG/2ML IJ SOLN: 4.0000 mg | Freq: Four times a day (QID) | INTRAMUSCULAR | Status: DC | PRN

## 2018-07-30 MED ORDER — CEFAZOLIN SODIUM-DEXTROSE 1-4 GM/50ML-% IV SOLN
1.0000 g | Freq: Four times a day (QID) | INTRAVENOUS | Status: AC
  Administered 2018-07-31 (×3): 1 g via INTRAVENOUS
  Filled 2018-07-30 (×3): qty 50

## 2018-07-30 MED ORDER — LIDOCAINE 2% (20 MG/ML) 5 ML SYRINGE
INTRAMUSCULAR | Status: AC
  Filled 2018-07-30: qty 5

## 2018-07-30 MED ORDER — SIMVASTATIN 20 MG PO TABS
20.0000 mg | ORAL_TABLET | Freq: Every evening | ORAL | Status: DC
  Administered 2018-07-31 – 2018-08-02 (×4): 20 mg via ORAL
  Filled 2018-07-30 (×4): qty 1

## 2018-07-30 MED ORDER — SODIUM CHLORIDE 0.9 % IV SOLN
INTRAVENOUS | Status: AC
  Filled 2018-07-30: qty 2

## 2018-07-30 MED ORDER — FUROSEMIDE 80 MG PO TABS
80.0000 mg | ORAL_TABLET | Freq: Every day | ORAL | Status: DC
  Administered 2018-07-31 – 2018-08-02 (×3): 80 mg via ORAL
  Filled 2018-07-30 (×3): qty 1

## 2018-07-30 MED ORDER — SODIUM CHLORIDE 0.9 % IV SOLN
INTRAVENOUS | Status: AC
  Filled 2018-07-30 (×2): qty 1.2

## 2018-07-30 MED ORDER — SODIUM CHLORIDE 0.9 % IV SOLN
INTRAVENOUS | Status: DC
  Administered 2018-07-30: 13:00:00 via INTRAVENOUS

## 2018-07-30 MED ORDER — SODIUM CHLORIDE 0.9 % IV SOLN: INTRAVENOUS | Status: DC

## 2018-07-30 MED ORDER — ACETAMINOPHEN 325 MG PO TABS
325.0000 mg | ORAL_TABLET | ORAL | Status: DC | PRN
  Administered 2018-07-31: 650 mg via ORAL
  Filled 2018-07-30: qty 2

## 2018-07-30 MED ORDER — ONDANSETRON HCL 4 MG/2ML IJ SOLN
INTRAMUSCULAR | Status: DC | PRN
  Administered 2018-07-30: 4 mg via INTRAVENOUS

## 2018-07-30 MED ORDER — PROPOFOL 10 MG/ML IV BOLUS
INTRAVENOUS | Status: DC | PRN
  Administered 2018-07-30: 130 mg via INTRAVENOUS

## 2018-07-30 MED ORDER — FENTANYL CITRATE (PF) 250 MCG/5ML IJ SOLN
INTRAMUSCULAR | Status: AC
  Filled 2018-07-30: qty 5

## 2018-07-30 MED ORDER — CEFAZOLIN SODIUM-DEXTROSE 2-4 GM/100ML-% IV SOLN: 2.0000 g | INTRAVENOUS | Status: DC

## 2018-07-30 MED ORDER — FUROSEMIDE 80 MG PO TABS: 80.0000 mg | ORAL_TABLET | ORAL | Status: DC

## 2018-07-30 MED ORDER — CEFAZOLIN SODIUM-DEXTROSE 2-4 GM/100ML-% IV SOLN
INTRAVENOUS | Status: AC
  Filled 2018-07-30: qty 100

## 2018-07-30 MED ORDER — DIGOXIN 125 MCG PO TABS
0.1250 mg | ORAL_TABLET | Freq: Every day | ORAL | Status: DC
  Administered 2018-07-31 – 2018-08-03 (×4): 0.125 mg via ORAL
  Filled 2018-07-30 (×4): qty 1

## 2018-07-30 MED ORDER — ONDANSETRON HCL 4 MG/2ML IJ SOLN: 4.0000 mg | Freq: Once | INTRAMUSCULAR | Status: DC | PRN

## 2018-07-30 MED ORDER — FENTANYL CITRATE (PF) 250 MCG/5ML IJ SOLN
INTRAMUSCULAR | Status: DC | PRN
  Administered 2018-07-30: 25 ug via INTRAVENOUS
  Administered 2018-07-30: 100 ug via INTRAVENOUS

## 2018-07-30 MED ORDER — MUPIROCIN 2 % EX OINT
TOPICAL_OINTMENT | CUTANEOUS | Status: AC
  Administered 2018-07-30: 1
  Filled 2018-07-30: qty 22

## 2018-07-30 MED ORDER — PHENYLEPHRINE 40 MCG/ML (10ML) SYRINGE FOR IV PUSH (FOR BLOOD PRESSURE SUPPORT)
PREFILLED_SYRINGE | INTRAVENOUS | Status: DC | PRN
  Administered 2018-07-30: 120 ug via INTRAVENOUS

## 2018-07-30 MED ORDER — ROCURONIUM BROMIDE 10 MG/ML (PF) SYRINGE
PREFILLED_SYRINGE | INTRAVENOUS | Status: DC | PRN
  Administered 2018-07-30 (×2): 25 mg via INTRAVENOUS
  Administered 2018-07-30 (×2): 50 mg via INTRAVENOUS

## 2018-07-30 MED ORDER — ROCURONIUM BROMIDE 50 MG/5ML IV SOSY
PREFILLED_SYRINGE | INTRAVENOUS | Status: AC
  Filled 2018-07-30: qty 10

## 2018-07-30 MED ORDER — WARFARIN SODIUM 5 MG PO TABS: 5.0000 mg | ORAL_TABLET | Freq: Every day | ORAL | Status: DC

## 2018-07-30 MED ORDER — LIDOCAINE 2% (20 MG/ML) 5 ML SYRINGE
INTRAMUSCULAR | Status: DC | PRN
  Administered 2018-07-30: 80 mg via INTRAVENOUS

## 2018-07-30 MED ORDER — IOPAMIDOL (ISOVUE-370) INJECTION 76%
INTRAVENOUS | Status: DC | PRN
  Administered 2018-07-30: 50 mL via INTRAVENOUS

## 2018-07-30 MED ORDER — WARFARIN SODIUM 1 MG PO TABS: 1.0000 mg | ORAL_TABLET | ORAL | Status: DC

## 2018-07-30 MED ORDER — SUGAMMADEX SODIUM 200 MG/2ML IV SOLN
INTRAVENOUS | Status: DC | PRN
  Administered 2018-07-30: 200 mg via INTRAVENOUS
  Administered 2018-07-30: 400 mg via INTRAVENOUS

## 2018-07-30 MED ORDER — FUROSEMIDE 80 MG PO TABS
120.0000 mg | ORAL_TABLET | Freq: Every day | ORAL | Status: DC
  Administered 2018-07-31 – 2018-08-03 (×4): 120 mg via ORAL
  Filled 2018-07-30 (×4): qty 1

## 2018-07-30 MED ORDER — CEFAZOLIN SODIUM-DEXTROSE 2-3 GM-%(50ML) IV SOLR
INTRAVENOUS | Status: DC | PRN
  Administered 2018-07-30: 2 g via INTRAVENOUS
  Administered 2018-07-30: 1 g via INTRAVENOUS

## 2018-07-30 MED ORDER — DIPHENHYDRAMINE HCL 25 MG PO CAPS
25.0000 mg | ORAL_CAPSULE | Freq: Four times a day (QID) | ORAL | Status: DC | PRN
  Administered 2018-07-31: 25 mg via ORAL
  Filled 2018-07-30: qty 1

## 2018-07-30 MED ORDER — POTASSIUM CHLORIDE CRYS ER 20 MEQ PO TBCR
20.0000 meq | EXTENDED_RELEASE_TABLET | Freq: Every evening | ORAL | Status: DC
  Administered 2018-07-31 – 2018-08-02 (×3): 20 meq via ORAL
  Filled 2018-07-30 (×3): qty 1

## 2018-07-30 MED ORDER — LIDOCAINE HCL (PF) 1 % IJ SOLN
INTRAMUSCULAR | Status: AC
  Filled 2018-07-30: qty 30

## 2018-07-30 MED ORDER — NALOXONE HCL 0.4 MG/ML IJ SOLN
INTRAMUSCULAR | Status: DC | PRN
  Administered 2018-07-30: 40 ug via INTRAVENOUS

## 2018-07-30 MED ORDER — WARFARIN SODIUM 5 MG PO TABS
6.0000 mg | ORAL_TABLET | ORAL | Status: DC
  Administered 2018-07-31 – 2018-08-02 (×2): 6 mg via ORAL
  Filled 2018-07-30 (×2): qty 1

## 2018-07-30 MED ORDER — WARFARIN SODIUM 5 MG PO TABS
5.0000 mg | ORAL_TABLET | ORAL | Status: DC
  Administered 2018-07-31 – 2018-08-01 (×2): 5 mg via ORAL
  Filled 2018-07-30 (×3): qty 1

## 2018-07-30 MED ORDER — ROCURONIUM BROMIDE 50 MG/5ML IV SOSY
PREFILLED_SYRINGE | INTRAVENOUS | Status: AC
  Filled 2018-07-30: qty 275

## 2018-07-30 MED ORDER — IPRATROPIUM BROMIDE 0.02 % IN SOLN: 0.5000 mg | Freq: Four times a day (QID) | RESPIRATORY_TRACT | Status: DC | PRN

## 2018-07-30 MED ORDER — CARVEDILOL 6.25 MG PO TABS
6.2500 mg | ORAL_TABLET | Freq: Two times a day (BID) | ORAL | Status: DC
  Administered 2018-07-31: 6.25 mg via ORAL
  Filled 2018-07-30: qty 1

## 2018-07-30 MED ORDER — ENALAPRIL MALEATE 5 MG PO TABS
5.0000 mg | ORAL_TABLET | Freq: Every day | ORAL | Status: DC
  Administered 2018-07-31 – 2018-08-01 (×2): 5 mg via ORAL
  Filled 2018-07-30 (×2): qty 1

## 2018-07-30 MED ORDER — CHLORHEXIDINE GLUCONATE 4 % EX LIQD: 60.0000 mL | Freq: Once | CUTANEOUS | Status: DC

## 2018-07-30 SURGICAL SUPPLY — 60 items
ADAPTER SEALING SSA-EW-09 (MISCELLANEOUS) ×2 IMPLANT
ADPR INTRO LNG 9FR SL XTD WNG (MISCELLANEOUS) ×2
BAG BANDED W/RUBBER/TAPE 36X54 (MISCELLANEOUS) ×4 IMPLANT
BAG DECANTER FOR FLEXI CONT (MISCELLANEOUS) ×4 IMPLANT
BAG EQP BAND 135X91 W/RBR TAPE (MISCELLANEOUS) ×2
BLADE 10 SAFETY STRL DISP (BLADE) ×2 IMPLANT
BLADE OSCILLATING /SAGITTAL (BLADE) IMPLANT
BLADE STERNUM SYSTEM 6 (BLADE) ×4 IMPLANT
BNDG ADH 5X4 AIR PERM ELC (GAUZE/BANDAGES/DRESSINGS)
BNDG COHESIVE 4X5 WHT NS (GAUZE/BANDAGES/DRESSINGS) IMPLANT
CANISTER SUCT 3000ML PPV (MISCELLANEOUS) ×4 IMPLANT
CATH ATTAIN COM SURV 6250V-45S (CATHETERS) ×2 IMPLANT
CLOSURE STERI-STRIP 1/2X4 (GAUZE/BANDAGES/DRESSINGS) ×1
CLSR STERI-STRIP ANTIMIC 1/2X4 (GAUZE/BANDAGES/DRESSINGS) ×1 IMPLANT
COIL ONE TIE COMPRESSION (VASCULAR PRODUCTS) ×2 IMPLANT
COVER BACK TABLE 60X90IN (DRAPES) ×4 IMPLANT
COVER SURGICAL LIGHT HANDLE (MISCELLANEOUS) ×2 IMPLANT
COVER WAND RF STERILE (DRAPES) ×4 IMPLANT
DRAPE CARDIOVASCULAR INCISE (DRAPES) ×4
DRAPE SRG 135X102X78XABS (DRAPES) ×2 IMPLANT
DRSG OPSITE 6X11 MED (GAUZE/BANDAGES/DRESSINGS) IMPLANT
DRSG TEGADERM 4X4.75 (GAUZE/BANDAGES/DRESSINGS) ×4 IMPLANT
ELECT REM PT RETURN 9FT ADLT (ELECTROSURGICAL) ×8
ELECTRODE REM PT RTRN 9FT ADLT (ELECTROSURGICAL) ×4 IMPLANT
GAUZE 4X4 16PLY RFD (DISPOSABLE) ×12 IMPLANT
GAUZE SPONGE 4X4 12PLY STRL (GAUZE/BANDAGES/DRESSINGS) ×2 IMPLANT
GAUZE SPONGE 4X4 12PLY STRL LF (GAUZE/BANDAGES/DRESSINGS) ×4 IMPLANT
GLOVE BIOGEL PI IND STRL 7.5 (GLOVE) ×2 IMPLANT
GLOVE BIOGEL PI INDICATOR 7.5 (GLOVE) ×2
GLOVE ECLIPSE 8.0 STRL XLNG CF (GLOVE) ×4 IMPLANT
GOWN STRL REUS W/ TWL LRG LVL3 (GOWN DISPOSABLE) IMPLANT
GOWN STRL REUS W/ TWL XL LVL3 (GOWN DISPOSABLE) ×2 IMPLANT
GOWN STRL REUS W/TWL LRG LVL3 (GOWN DISPOSABLE)
GOWN STRL REUS W/TWL XL LVL3 (GOWN DISPOSABLE) ×4
GUIDEWIRE ANGLED .035X150CM (WIRE) ×2 IMPLANT
ICD CLARIA MRI DTMA1Q1 (ICD Generator) ×2 IMPLANT
KIT LEAD ACCESSORY 6056M PINCH (KITS) ×2 IMPLANT
KIT TURNOVER KIT B (KITS) ×4 IMPLANT
LEAD ATTAIN PERFORMA S 4598-88 (Lead) ×2 IMPLANT
LEAD SPRINT QUAT SEC 6935-65CM (Lead) ×2 IMPLANT
NDL PERC 18GX7CM (NEEDLE) IMPLANT
NEEDLE PERC 18GX7CM (NEEDLE) ×4 IMPLANT
PAD ARMBOARD 7.5X6 YLW CONV (MISCELLANEOUS) ×8 IMPLANT
PAD ELECT DEFIB RADIOL ZOLL (MISCELLANEOUS) ×4 IMPLANT
PIN PLUG IS-1 DEFIB (PIN) ×2 IMPLANT
SHEATH CLASSIC 9.5F (SHEATH) ×2 IMPLANT
SHEATH CLASSIC 9F (SHEATH) ×4 IMPLANT
SHEATH EVOLUTION RL 11F (SHEATH) ×2 IMPLANT
SHEATH EVOLUTION SHORTE RL 11F (SHEATH) ×2 IMPLANT
SHEATH PINNACLE 6F 10CM (SHEATH) ×2 IMPLANT
SLITTER 6232ADJ (MISCELLANEOUS) ×2 IMPLANT
STYLET LIBERATOR LOCKING (MISCELLANEOUS) ×2 IMPLANT
SUT PROLENE 2 0 SH DA (SUTURE) IMPLANT
TOWEL OR 17X24 6PK STRL BLUE (TOWEL DISPOSABLE) ×8 IMPLANT
TRAY FOLEY SLVR 16FR TEMP STAT (SET/KITS/TRAYS/PACK) ×4 IMPLANT
TUBE CONNECTING 12'X1/4 (SUCTIONS)
TUBE CONNECTING 12X1/4 (SUCTIONS) IMPLANT
TUBING EXTENTION W/L.L. (IV SETS) ×2 IMPLANT
WIRE MAILMAN 182CM (WIRE) ×2 IMPLANT
YANKAUER SUCT BULB TIP NO VENT (SUCTIONS) IMPLANT

## 2018-07-30 NOTE — Progress Notes (Signed)
Patient placed on Bipap in PACU per Dr Smith Jody. Will continue to monitor and attempt to wean off Bipap.

## 2018-07-30 NOTE — Anesthesia Postprocedure Evaluation (Addendum)
Anesthesia Post Note  Patient: Jaime Scott  Procedure(s) Performed: PACEMAKER LEAD REMOVAL (N/A Chest) BIV ICD INSERTION CRT-D (N/A )     Patient location during evaluation: PACU Anesthesia Type: General Level of consciousness: awake Pain management: pain level controlled Vital Signs Assessment: post-procedure vital signs reviewed and stable Respiratory status: spontaneous breathing, nonlabored ventilation and respiratory function stable Cardiovascular status: blood pressure returned to baseline and stable Postop Assessment: no apparent nausea or vomiting Anesthetic complications: no Comments: Pt has normal mentation. Has history of CO2 retention COPD & CHF. Continue BiPAP to prevent worsening hypercarbia. Will need step down care and possible diuresis.      Last Vitals:  Vitals:   07/30/18 2021 07/30/18 2036  BP: (!) 144/85 128/83  Pulse: 100 97  Resp: (!) 29 (!) 33  Temp:  (!) 36.3 C  SpO2: 94% 93%    Last Pain:  Vitals:   07/30/18 2036  TempSrc:   PainSc: Bryant Shadeed Colberg

## 2018-07-30 NOTE — Anesthesia Procedure Notes (Signed)
Procedure Name: Intubation Date/Time: 07/30/2018 3:47 PM Performed by: Teressa Lower., CRNA Pre-anesthesia Checklist: Patient identified, Emergency Drugs available, Suction available and Patient being monitored Patient Re-evaluated:Patient Re-evaluated prior to induction Oxygen Delivery Method: Circle system utilized Preoxygenation: Pre-oxygenation with 100% oxygen Induction Type: IV induction Ventilation: Mask ventilation without difficulty Laryngoscope Size: Miller and 3 Grade View: Grade I Tube type: Oral Tube size: 7.5 mm Number of attempts: 1 Airway Equipment and Method: Stylet and Oral airway Placement Confirmation: ETT inserted through vocal cords under direct vision,  positive ETCO2 and breath sounds checked- equal and bilateral Secured at: 22 cm Tube secured with: Tape Dental Injury: Teeth and Oropharynx as per pre-operative assessment

## 2018-07-30 NOTE — Transfer of Care (Signed)
Immediate Anesthesia Transfer of Care Note  Patient: Jaime Scott  Procedure(s) Performed: PACEMAKER LEAD REMOVAL (N/A Chest) BIV ICD INSERTION CRT-D (N/A )  Patient Location: PACU  Anesthesia Type:General  Level of Consciousness: awake, alert  and oriented  Airway & Oxygen Therapy: Patient connected to face mask oxygen and Bipap in pacu  Post-op Assessment: Report given to RN and Post -op Vital signs reviewed and stable  Post vital signs: Reviewed and stable  Last Vitals:  Vitals Value Taken Time  BP 127/96 07/30/2018  6:51 PM  Temp 36.3 C 07/30/2018  6:50 PM  Pulse 109 07/30/2018  6:51 PM  Resp 25 07/30/2018  6:51 PM  SpO2 92 % 07/30/2018  6:51 PM  Vitals shown include unvalidated device data.  Last Pain:  Vitals:   07/30/18 1850  TempSrc:   PainSc: (P) 0-No pain         Complications: No apparent anesthesia complications

## 2018-07-30 NOTE — Anesthesia Preprocedure Evaluation (Addendum)
Anesthesia Evaluation  Patient identified by MRN, date of birth, ID band Patient awake    Reviewed: Allergy & Precautions, NPO status , Patient's Chart, lab work & pertinent test results, reviewed documented beta blocker date and time   Airway Mallampati: II  TM Distance: >3 FB Neck ROM: Full    Dental  (+) Teeth Intact, Dental Advisory Given, Chipped   Pulmonary shortness of breath, COPD (3L),  COPD inhaler and oxygen dependent, Current Smoker,    Pulmonary exam normal breath sounds clear to auscultation       Cardiovascular hypertension, Pt. on home beta blockers and Pt. on medications + Peripheral Vascular Disease and +CHF  + dysrhythmias (LBBB) Atrial Fibrillation + pacemaker + Cardiac Defibrillator  Rhythm:Irregular Rate:Abnormal     Neuro/Psych negative neurological ROS     GI/Hepatic negative GI ROS, (+)     substance abuse  alcohol use,   Endo/Other  diabetes, Type 2, Oral Hypoglycemic AgentsObesity   Renal/GU negative Renal ROS     Musculoskeletal  (+) Arthritis ,   Abdominal   Peds  Hematology  (+) Blood dyscrasia (Warfarin), ,   Anesthesia Other Findings Day of surgery medications reviewed with the patient.  Reproductive/Obstetrics                            Anesthesia Physical Anesthesia Plan  ASA: III  Anesthesia Plan: General   Post-op Pain Management:    Induction: Intravenous  PONV Risk Score and Plan: 2 and Dexamethasone, Ondansetron and Midazolam  Airway Management Planned: Oral ETT  Additional Equipment: TEE and Arterial line  Intra-op Plan:   Post-operative Plan: Extubation in OR  Informed Consent: I have reviewed the patients History and Physical, chart, labs and discussed the procedure including the risks, benefits and alternatives for the proposed anesthesia with the patient or authorized representative who has indicated his/her understanding and  acceptance.   Dental advisory given  Plan Discussed with: CRNA  Anesthesia Plan Comments:         Anesthesia Quick Evaluation

## 2018-07-30 NOTE — Interval H&P Note (Signed)
History and Physical Interval Note:  07/30/2018 12:59 PM  Jaime Scott  has presented today for surgery, with the diagnosis of [place], LEFT BUNDLE BRANCH BLOCK, LEFT OCCLUDED SUBCLAVIAN VEIN  The various methods of treatment have been discussed with the patient and family. After consideration of risks, benefits and other options for treatment, the patient has consented to  Procedure(s): PACEMAKER LEAD REMOVAL (N/A) along with insertion of a new ICD lead, RA lead and CS lead as a surgical intervention .  The patient's history has been reviewed, patient examined, no change in status, stable for surgery.  I have reviewed the patient's chart and labs.  Questions were answered to the patient's satisfaction.     Cristopher Peru

## 2018-07-30 NOTE — Progress Notes (Signed)
Dr Smith Delfin made aware of CO2 of 72.3. Will transfer to floor on BIPAP. Dr Lovena Le aware.

## 2018-07-30 NOTE — Anesthesia Procedure Notes (Signed)
Arterial Line Insertion Start/End1/09/2018 1:30 PM, 07/30/2018 1:35 PM Performed by: Catalina Gravel, MD, anesthesiologist  Patient location: Pre-op. Preanesthetic checklist: patient identified, IV checked, site marked, risks and benefits discussed, surgical consent, monitors and equipment checked, pre-op evaluation, timeout performed and anesthesia consent Lidocaine 1% used for infiltration Left, radial was placed Catheter size: 20 Fr Hand hygiene performed  and maximum sterile barriers used   Attempts: 1 Procedure performed without using ultrasound guided technique. Following insertion, dressing applied and Biopatch. Post procedure assessment: normal and unchanged  Patient tolerated the procedure well with no immediate complications. Additional procedure comments: Attempt x1 by CRNA, attempt x1 by MDA.Marland Kitchen

## 2018-07-30 NOTE — CV Procedure (Signed)
EP procedure note  Procedure performed: Extraction of a single-chamber ICD and insertion of a new dual-chamber biventricular ICD utilizing coronary sinus venography  Preoperative diagnosis: Chronic systolic heart failure, left bundle branch block, status post ICD insertion with the old device unable to be upgraded on the ipsilateral side secondary to a chronic left subclavian vein occlusion  Postoperative diagnosis: Same as preoperative diagnosis  Description of the procedure: After informed consent was obtained the patient was taken to the operating room in the fasting state.  The anesthesia service was utilized to provide general endotracheal anesthesia.  Left femoral artery catheter was placed for invasive hemodynamic monitoring.  After the usual preparation and draping was carried out, central venous access was then inserted into the right femoral vein with a 6 French sheath.  Attention was then turned to removal of the ICD lead.  Our plan was to use the previously implanted ICD lead to allow for access into the central circulation utilizing the extraction sheath.  First the stylette was inserted into the lead after the dense fibrous scar tissue was freed up with electrocautery.  The helix of the lead was retracted into the body of the lead.  Next the lead was cut.  A Cook liberator locking stylette was inserted into the distal tip of the lead and locked in place.  A Cook 1 tie tool was used to lock in the proximal portion of the lead.  The [facility] 11 [place] RL short dissection sheath was utilized to enter the left subclavian vein.  There is fairly dense fibrous tissue in the proximal portion of the subclavian vein but eventually it was traversed with a combination of gentle traction and pressure and counterpressure on the extraction sheath.  The short sheath was exchanged for a Cook 11 [place] RL long dissection sheath and the traction was again placed on the lead and pressure and counterpressure was  utilized to advance the extraction sheath over the body of the lead down to the subclavian vein/innominate vein junction.  At this point, the tip of the defibrillation lead became loose and free-floating and with traction began to come out of the right ventricle and into the superior vena cava where traction was terminated on the lead.  At this point a needles I snare was utilized by way of the right femoral venous sheath and attempts to snare the lead and hold in place.  This was carried out successfully.  Additional traction was placed on the lead along with pressure and counterpressure in attempt to traverse the dense fibrous scar tissue in the innominate vein.  Unfortunately the lead pulled free of the snare and back into the sheath at the junction of the innominate vein and the subclavian vein.  The lead could no longer be snared as it was bound up in fibrous scar tissue.  At this point we remove the lead and left the sheath in place but unfortunately it was not freeing clear of the occluded vein.  I was able to place a Glidewire into the left jugular vein but could not pass through the occluded portion of the innominate vein.  At this point the sheath was removed pressure was held and hemostasis obtained.  The left infraclavicular incision was irrigated and then closed with 2 layers of Vicryl suture.  Attention was then turned to insertion of a new biventricular ICD on the contralateral side.  The 6 French sheath in the right femoral vein was removed and pressure was held to obtain hemostasis.  After the usual preparation and draping, subcutaneous lidocaine was infiltrated into the right infraclavicular region.  A 5 cm incision was carried out over this region and electrocautery utilized to dissect down to the fascia.  The right subclavian vein was punctured twice and the Medtronic model 6935 active-fixation single coil defibrillation lead, serial [id number], was advanced into the right subclavian vein  and on into the right ventricle under fluoroscopic guidance.  In the RV apical septum, the lead was actively fixed.  R waves measured 13.  The pacing impedance was 650 ohms.  The pacing threshold 0.7 V at 0.5 ms.  Next attention was turned to placement of the left ventricular lead.  The coronary sinus guiding catheter along with a 6 French hexapolar EP catheter was inserted into the right atrium and advanced into the coronary sinus.  Venography of the coronary sinus was carried out demonstrating a large lateral vein with 2 branches.  The superior branch was chosen for insertion of the LV lead.  The Medtronic quadripolar LV pacing lead, serial [id number], was advanced over the angioplasty 014 guidewire and into the lateral vein.  The pacing threshold was satisfactory in all vectors.  Diaphragmatic stimulation could not be assessed as the patient was paralyzed and under general anesthesia.  The lead was liberated from the guiding catheter in the usual manner.  The leads were secured to the fascia with silk suture.  The sewing sleeve was secured with silk suture.  Electrocautery was then utilized to make a subcutaneous pocket.  The pocket was irrigated with antibiotic irrigation and the new Medtronic biventricular ICD, serial [id number], was connected to the LV and the RV leads.  The atrial port and the proximal coil port were plugged.  The generator was inserted into the pocket and the pocket irrigated with antibiotic irrigation.  The incision was closed with 2 layers of Vicryl suture.  The patient was left programmed in the VVIR mode at a rate of 80 bpm with bipolar pacing and place.  Benzoin and Steri-Strips were painted on the skin and a bandage applied and the patient returned to the recovery area in stable condition.  Complications: There were no immediate procedural complications  Conclusion: Successful insertion of a biventricular ICD and successful extraction of a biventricular ICD system in  a patient with chronic systolic heart failure, left bundle branch block, who had an occluded left subclavian vein after initial attempt to upgrade the patient to a biventricular ICD.  Cristopher Peru, MD

## 2018-07-31 ENCOUNTER — Ambulatory Visit (HOSPITAL_COMMUNITY): Payer: Medicare Other

## 2018-07-31 DIAGNOSIS — E872 Acidosis: Secondary | ICD-10-CM | POA: Diagnosis not present

## 2018-07-31 DIAGNOSIS — Z95810 Presence of automatic (implantable) cardiac defibrillator: Secondary | ICD-10-CM

## 2018-07-31 DIAGNOSIS — I110 Hypertensive heart disease with heart failure: Secondary | ICD-10-CM | POA: Diagnosis not present

## 2018-07-31 DIAGNOSIS — I4821 Permanent atrial fibrillation: Secondary | ICD-10-CM | POA: Diagnosis not present

## 2018-07-31 DIAGNOSIS — E1165 Type 2 diabetes mellitus with hyperglycemia: Secondary | ICD-10-CM | POA: Diagnosis not present

## 2018-07-31 DIAGNOSIS — I82B22 Chronic embolism and thrombosis of left subclavian vein: Secondary | ICD-10-CM | POA: Diagnosis not present

## 2018-07-31 DIAGNOSIS — I5022 Chronic systolic (congestive) heart failure: Secondary | ICD-10-CM | POA: Diagnosis not present

## 2018-07-31 DIAGNOSIS — E1151 Type 2 diabetes mellitus with diabetic peripheral angiopathy without gangrene: Secondary | ICD-10-CM | POA: Diagnosis not present

## 2018-07-31 DIAGNOSIS — J9611 Chronic respiratory failure with hypoxia: Secondary | ICD-10-CM | POA: Diagnosis not present

## 2018-07-31 DIAGNOSIS — I428 Other cardiomyopathies: Secondary | ICD-10-CM | POA: Diagnosis not present

## 2018-07-31 DIAGNOSIS — Z9981 Dependence on supplemental oxygen: Secondary | ICD-10-CM | POA: Diagnosis not present

## 2018-07-31 DIAGNOSIS — Z4502 Encounter for adjustment and management of automatic implantable cardiac defibrillator: Secondary | ICD-10-CM | POA: Diagnosis not present

## 2018-07-31 DIAGNOSIS — J9622 Acute and chronic respiratory failure with hypercapnia: Secondary | ICD-10-CM | POA: Diagnosis not present

## 2018-07-31 LAB — PROTIME-INR
INR: 1.12
Prothrombin Time: 14.3 seconds (ref 11.4–15.2)

## 2018-07-31 LAB — GLUCOSE, CAPILLARY
GLUCOSE-CAPILLARY: 162 mg/dL — AB (ref 70–99)
Glucose-Capillary: 186 mg/dL — ABNORMAL HIGH (ref 70–99)
Glucose-Capillary: 216 mg/dL — ABNORMAL HIGH (ref 70–99)
Glucose-Capillary: 147 mg/dL — ABNORMAL HIGH (ref 70–99)

## 2018-07-31 LAB — BLOOD GAS, ARTERIAL
Acid-Base Excess: 3.8 mmol/L — ABNORMAL HIGH (ref 0.0–2.0)
BICARBONATE: 30.5 mmol/L — AB (ref 20.0–28.0)
O2 Saturation: 90.6 %
Patient temperature: 98.6
pCO2 arterial: 72.3 mmHg (ref 32.0–48.0)
pH, Arterial: 7.249 — ABNORMAL LOW (ref 7.350–7.450)
pO2, Arterial: 68.1 mmHg — ABNORMAL LOW (ref 83.0–108.0)

## 2018-07-31 MED ORDER — FUROSEMIDE 10 MG/ML IJ SOLN
60.0000 mg | Freq: Once | INTRAMUSCULAR | Status: AC
  Administered 2018-07-31: 60 mg via INTRAVENOUS
  Filled 2018-07-31: qty 6

## 2018-07-31 MED ORDER — CARVEDILOL 12.5 MG PO TABS
12.5000 mg | ORAL_TABLET | Freq: Two times a day (BID) | ORAL | Status: DC
  Administered 2018-07-31 – 2018-08-01 (×2): 12.5 mg via ORAL
  Filled 2018-07-31 (×2): qty 1

## 2018-07-31 NOTE — Significant Event (Signed)
Rapid Response Event Note  Called to removed radial arterial line. Line removed at 2340, pressure held, no bleeding/bruising/hematoma, catheter intact, pressure dressing applied, RN to remove dressing in 2-3 hours.   Irine Heminger R

## 2018-07-31 NOTE — Progress Notes (Addendum)
Progress Note  Patient Name: Jaime Scott Date of Encounter: 07/31/2018  Primary Cardiologist: Curt Bears  Subjective   No chest pain or sob. Denies pain.  Inpatient Medications    Scheduled Meds: . sodium chloride   Intravenous Once  . carvedilol  6.25 mg Oral BID WC  . digoxin  0.125 mg Oral Daily  . diltiazem  30 mg Oral BID  . enalapril  5 mg Oral Daily  . furosemide  120 mg Oral Daily   And  . furosemide  80 mg Oral Daily  . heparin irrigation 6000 unit   Irrigation To OR  . potassium chloride SA  20 mEq Oral QPM  . simvastatin  20 mg Oral QPM  . warfarin  5 mg Oral Q T,Th,S,Su-1800   And  . warfarin  6 mg Oral Q M,W,F-1800  . Warfarin - Physician Dosing Inpatient   Does not apply q1800   Continuous Infusions: .  ceFAZolin (ANCEF) IV 1 g (07/31/18 0544)   PRN Meds: acetaminophen, diphenhydrAMINE, ipratropium, ondansetron (ZOFRAN) IV   Vital Signs    Vitals:   07/31/18 0355 07/31/18 0448 07/31/18 0746 07/31/18 0854  BP:  133/64 114/71   Pulse: 92 (!) 103    Resp:      Temp:    98.4 F (36.9 C)  TempSrc:    Oral  SpO2: 97% 95% 96%   Weight:      Height:        Intake/Output Summary (Last 24 hours) at 07/31/2018 1007 Last data filed at 07/31/2018 0549 Gross per 24 hour  Intake 2000 ml  Output 495 ml  Net 1505 ml   Filed Weights   07/30/18 1219  Weight: 106.6 kg    Telemetry    Atrial fib with bbb and bi-ventricular pacing - Personally Reviewed  ECG    Atrial fib - Personally Reviewed  Physical Exam   GEN: No acute distress.   Neck: No JVD Cardiac: IRRR, no murmurs, rubs, or gallops.  Respiratory: Clear to auscultation bilaterally. GI: Soft, nontender, non-distended  MS: No edema; No deformity. Neuro:  Nonfocal  Psych: Normal affect   Labs    Chemistry Recent Labs  Lab 07/30/18 1245  NA 136  K 3.6  CL 99  CO2 29  GLUCOSE 144*  BUN 7*  CREATININE 0.67  CALCIUM 8.7*  GFRNONAA >60  GFRAA >60  ANIONGAP 8      Hematology Recent Labs  Lab 07/30/18 1245  WBC 7.9  RBC 4.42  HGB 12.9*  HCT 41.7  MCV 94.3  MCH 29.2  MCHC 30.9  RDW 12.5  PLT 260    Cardiac EnzymesNo results for input(s): TROPONINI in the last 168 hours. No results for input(s): TROPIPOC in the last 168 hours.   BNPNo results for input(s): BNP, PROBNP in the last 168 hours.   DDimer No results for input(s): DDIMER in the last 168 hours.   Radiology    No results found.  Cardiac Studies   none  Patient Profile     68 y.o. male admitted for biv upgrade in the setting of an occluded Left SCV  Assessment & Plan    1. Chronic systolic heart failure - he seems a little dyspneic to me today though he denies sob. We will give some IV lasix. 2. Atrial fib - his VR is up a bit. We will give some IV lopressor and uptitrate his beta blocker. 3. ICD - he is s/p left sided extraction  with out unable to maintain access to the central circulation, s/p right sided biv ICD insertion. Await CXR.  For questions or updates, please contact [place] Please consult [email or web address] for contact info under Cardiology/STEMI.   Signed, Cristopher Peru, MD  07/31/2018, 10:07 AM  Patient ID: Jaime Scott, male   DOB: Mar 08, 1951, 68 y.o.   MRN: [id number]

## 2018-07-31 NOTE — Progress Notes (Signed)
Took pt off bipap and placed on 9L high flow cannula.  Sats 93-94%.

## 2018-08-01 DIAGNOSIS — I5023 Acute on chronic systolic (congestive) heart failure: Secondary | ICD-10-CM | POA: Diagnosis not present

## 2018-08-01 DIAGNOSIS — J441 Chronic obstructive pulmonary disease with (acute) exacerbation: Secondary | ICD-10-CM

## 2018-08-01 DIAGNOSIS — Z95810 Presence of automatic (implantable) cardiac defibrillator: Secondary | ICD-10-CM | POA: Diagnosis not present

## 2018-08-01 LAB — GLUCOSE, CAPILLARY
GLUCOSE-CAPILLARY: 153 mg/dL — AB (ref 70–99)
GLUCOSE-CAPILLARY: 187 mg/dL — AB (ref 70–99)
GLUCOSE-CAPILLARY: 178 mg/dL — AB (ref 70–99)
Glucose-Capillary: 230 mg/dL — ABNORMAL HIGH (ref 70–99)

## 2018-08-01 LAB — PROTIME-INR
INR: 1.22
Prothrombin Time: 15.3 seconds — ABNORMAL HIGH (ref 11.4–15.2)

## 2018-08-01 MED ORDER — IPRATROPIUM BROMIDE 0.02 % IN SOLN
0.5000 mg | Freq: Three times a day (TID) | RESPIRATORY_TRACT | Status: DC
  Administered 2018-08-01 – 2018-08-02 (×3): 0.5 mg via RESPIRATORY_TRACT
  Filled 2018-08-01 (×4): qty 2.5

## 2018-08-01 MED ORDER — LOSARTAN POTASSIUM 50 MG PO TABS
50.0000 mg | ORAL_TABLET | Freq: Every day | ORAL | Status: DC
  Administered 2018-08-02 – 2018-08-03 (×2): 50 mg via ORAL
  Filled 2018-08-01 (×2): qty 1

## 2018-08-01 MED ORDER — METOPROLOL SUCCINATE ER 50 MG PO TB24
50.0000 mg | ORAL_TABLET | Freq: Every day | ORAL | Status: DC
  Administered 2018-08-01 – 2018-08-03 (×3): 50 mg via ORAL
  Filled 2018-08-01 (×3): qty 1

## 2018-08-01 MED ORDER — IPRATROPIUM BROMIDE 0.02 % IN SOLN: 0.5000 mg | Freq: Four times a day (QID) | RESPIRATORY_TRACT | Status: DC

## 2018-08-01 MED ORDER — LEVALBUTEROL HCL 0.63 MG/3ML IN NEBU
0.6300 mg | INHALATION_SOLUTION | Freq: Three times a day (TID) | RESPIRATORY_TRACT | Status: DC
  Administered 2018-08-01 – 2018-08-02 (×4): 0.63 mg via RESPIRATORY_TRACT
  Filled 2018-08-01 (×5): qty 3

## 2018-08-01 NOTE — Progress Notes (Addendum)
Progress Note  Patient Name: Jaime Scott Date of Encounter: 08/01/2018  Primary Cardiologist: Dr Jaime Scott- EP Dr Jaime Scott  Subjective   SOB at rest- sitting on side of the bed  Inpatient Medications    Scheduled Meds: . sodium chloride   Intravenous Once  . carvedilol  12.5 mg Oral BID WC  . digoxin  0.125 mg Oral Daily  . enalapril  5 mg Oral Daily  . furosemide  120 mg Oral Daily   And  . furosemide  80 mg Oral Daily  . potassium chloride SA  20 mEq Oral QPM  . simvastatin  20 mg Oral QPM  . warfarin  5 mg Oral Q T,Th,S,Su-1800   And  . warfarin  6 mg Oral Q M,W,F-1800  . Warfarin - Physician Dosing Inpatient   Does not apply q1800   Continuous Infusions:  PRN Meds: acetaminophen, diphenhydrAMINE, ipratropium, ondansetron (ZOFRAN) IV   Vital Signs    Vitals:   08/01/18 0027 08/01/18 0048 08/01/18 0555 08/01/18 0808  BP: 120/64  (!) 147/93 138/84  Pulse: 96 95 (!) 106 99  Resp: (!) 27  (!) 27   Temp:   97.7 F (36.5 C) 97.9 F (36.6 C)  TempSrc:   Oral Oral  SpO2: 90% 98% 96% 100%  Weight:   110.4 kg   Height:        Intake/Output Summary (Last 24 hours) at 08/01/2018 0920 Last data filed at 08/01/2018 0559 Gross per 24 hour  Intake 520 ml  Output 2200 ml  Net -1680 ml   Filed Weights   07/30/18 1219 08/01/18 0555  Weight: 106.6 kg 110.4 kg    Telemetry    AF-POD - Personally Reviewed  ECG     LBBB-paced- Personally Reviewed  Physical Exam   GEN: SOB at rest-on O2, morbid obesity.   Neck: No JVD Cardiac: decreased heart sounds   Respiratory: decreased breath sounds. MS: No edema; No deformity. Neuro:  Nonfocal  Psych: Normal affect   Labs    Chemistry Recent Labs  Lab 07/30/18 1245  NA 136  K 3.6  CL 99  CO2 29  GLUCOSE 144*  BUN 7*  CREATININE 0.67  CALCIUM 8.7*  GFRNONAA >60  GFRAA >60  ANIONGAP 8     Hematology Recent Labs  Lab 07/30/18 1245  WBC 7.9  RBC 4.42  HGB 12.9*  HCT 41.7  MCV 94.3  MCH 29.2    MCHC 30.9  RDW 12.5  PLT 260    Cardiac EnzymesNo results for input(s): TROPONINI in the last 168 hours. No results for input(s): TROPIPOC in the last 168 hours.   BNPNo results for input(s): BNP, PROBNP in the last 168 hours.   DDimer No results for input(s): DDIMER in the last 168 hours.   Radiology    Dg Chest Port 1 View  Result Date: 07/31/2018 CLINICAL DATA:  AICD placement EXAM: PORTABLE CHEST 1 VIEW COMPARISON:  11/06/2015 FINDINGS: Left subclavian AICD device has been removed. Right subclavian AICD device has been placed. Tips of the leads project over the right ventricle apex and coronary sinus. The heart is moderately enlarged. Vascular congestion without interstitial edema. No pneumothorax. IMPRESSION: Right subclavian AICD device placed without pneumothorax. Electronically Signed   By: Jaime Scott M.D.   On: 07/31/2018 12:19    Cardiac Studies     Patient Profile     68 y.o. male with NICM admitted for elective ICD upgrade  Assessment & Plan  S/P Rt sided BiV ICD upgrade  Chronic systolic heart failure  COPD- severe COPD-PCO2 72- PO2 68- he turned his O2 up to 4L, his home dose  PAF- Coumadin resumed  Plan: MD to see-I turned his O2 down to 2L.  Consider pulmonary consult  For questions or updates, please contact [place] Please consult [email or web address] for contact info under        Signed, Jaime Ransom, PA-C  08/01/2018, 9:20 AM    EP Attending  He states feels worse today than before. I gave additional IV lasix yesterday. Mentioned that he normally is on 4 liters of oxygen at home. On exam he looks dyspneic but not actively wheezing. Minimal edema. CXR reviewed with increased markings. We will need to get pulmonary service involved. He does not have a pulmonologist despite his lung disease. I will stop coreg and use beta 1 selective toprol which should also help with rate control.   Jaime Scott.D.

## 2018-08-01 NOTE — Progress Notes (Signed)
Pt off bipap and on the salter High Flow

## 2018-08-01 NOTE — Progress Notes (Signed)
[facility]  Pulmonary consulted for dyspnea chronic respiratory failure.  PCCM consult service will see within next 24 hours unless needed sooner.    Roselie Awkward, MD [facility] Pager: [phone] Cell: [phone] If no response, call [phone]

## 2018-08-01 NOTE — Plan of Care (Signed)
Wean Oxygen as tolerated. Assess need for urinary catheter.

## 2018-08-01 NOTE — H&P (View-Only) (Signed)
Progress Note  Patient Name: Jaime Scott Date of Encounter: 08/01/2018  Primary Cardiologist: Dr Bettina Gavia- EP Dr Curt Bears  Subjective   SOB at rest- sitting on side of the bed  Inpatient Medications    Scheduled Meds: . sodium chloride   Intravenous Once  . carvedilol  12.5 mg Oral BID WC  . digoxin  0.125 mg Oral Daily  . enalapril  5 mg Oral Daily  . furosemide  120 mg Oral Daily   And  . furosemide  80 mg Oral Daily  . potassium chloride SA  20 mEq Oral QPM  . simvastatin  20 mg Oral QPM  . warfarin  5 mg Oral Q T,Th,S,Su-1800   And  . warfarin  6 mg Oral Q M,W,F-1800  . Warfarin - Physician Dosing Inpatient   Does not apply q1800   Continuous Infusions:  PRN Meds: acetaminophen, diphenhydrAMINE, ipratropium, ondansetron (ZOFRAN) IV   Vital Signs    Vitals:   08/01/18 0027 08/01/18 0048 08/01/18 0555 08/01/18 0808  BP: 120/64  (!) 147/93 138/84  Pulse: 96 95 (!) 106 99  Resp: (!) 27  (!) 27   Temp:   97.7 F (36.5 C) 97.9 F (36.6 C)  TempSrc:   Oral Oral  SpO2: 90% 98% 96% 100%  Weight:   110.4 kg   Height:        Intake/Output Summary (Last 24 hours) at 08/01/2018 0920 Last data filed at 08/01/2018 0559 Gross per 24 hour  Intake 520 ml  Output 2200 ml  Net -1680 ml   Filed Weights   07/30/18 1219 08/01/18 0555  Weight: 106.6 kg 110.4 kg    Telemetry    AF-POD - Personally Reviewed  ECG     LBBB-paced- Personally Reviewed  Physical Exam   GEN: SOB at rest-on O2, morbid obesity.   Neck: No JVD Cardiac: decreased heart sounds   Respiratory: decreased breath sounds. MS: No edema; No deformity. Neuro:  Nonfocal  Psych: Normal affect   Labs    Chemistry Recent Labs  Lab 07/30/18 1245  NA 136  K 3.6  CL 99  CO2 29  GLUCOSE 144*  BUN 7*  CREATININE 0.67  CALCIUM 8.7*  GFRNONAA >60  GFRAA >60  ANIONGAP 8     Hematology Recent Labs  Lab 07/30/18 1245  WBC 7.9  RBC 4.42  HGB 12.9*  HCT 41.7  MCV 94.3  MCH 29.2    MCHC 30.9  RDW 12.5  PLT 260    Cardiac EnzymesNo results for input(s): TROPONINI in the last 168 hours. No results for input(s): TROPIPOC in the last 168 hours.   BNPNo results for input(s): BNP, PROBNP in the last 168 hours.   DDimer No results for input(s): DDIMER in the last 168 hours.   Radiology    Dg Chest Port 1 View  Result Date: 07/31/2018 CLINICAL DATA:  AICD placement EXAM: PORTABLE CHEST 1 VIEW COMPARISON:  11/06/2015 FINDINGS: Left subclavian AICD device has been removed. Right subclavian AICD device has been placed. Tips of the leads project over the right ventricle apex and coronary sinus. The heart is moderately enlarged. Vascular congestion without interstitial edema. No pneumothorax. IMPRESSION: Right subclavian AICD device placed without pneumothorax. Electronically Signed   By: Marybelle Killings M.D.   On: 07/31/2018 12:19    Cardiac Studies     Patient Profile     68 y.o. male with NICM admitted for elective ICD upgrade  Assessment & Plan  S/P Rt sided BiV ICD upgrade  Chronic systolic heart failure  COPD- severe COPD-PCO2 72- PO2 68- he turned his O2 up to 4L, his home dose  PAF- Coumadin resumed  Plan: MD to see-I turned his O2 down to 2L.  Consider pulmonary consult  For questions or updates, please contact [place] Please consult [email or web address] for contact info under        Signed, Kerin Ransom, PA-C  08/01/2018, 9:20 AM    EP Attending  He states feels worse today than before. I gave additional IV lasix yesterday. Mentioned that he normally is on 4 liters of oxygen at home. On exam he looks dyspneic but not actively wheezing. Minimal edema. CXR reviewed with increased markings. We will need to get pulmonary service involved. He does not have a pulmonologist despite his lung disease. I will stop coreg and use beta 1 selective toprol which should also help with rate control.   [name].D.

## 2018-08-02 ENCOUNTER — Encounter: Payer: Medicare Other | Admitting: Cardiology

## 2018-08-02 ENCOUNTER — Inpatient Hospital Stay (HOSPITAL_COMMUNITY)
Admission: RE | Disposition: A | Discharge status: Home / Self Care | Payer: Self-pay | Source: Home / Self Care | Attending: Internal Medicine

## 2018-08-02 DIAGNOSIS — Z803 Family history of malignant neoplasm of breast: Secondary | ICD-10-CM | POA: Diagnosis not present

## 2018-08-02 DIAGNOSIS — I255 Ischemic cardiomyopathy: Secondary | ICD-10-CM | POA: Diagnosis present

## 2018-08-02 DIAGNOSIS — E1151 Type 2 diabetes mellitus with diabetic peripheral angiopathy without gangrene: Secondary | ICD-10-CM | POA: Diagnosis present

## 2018-08-02 DIAGNOSIS — J9611 Chronic respiratory failure with hypoxia: Secondary | ICD-10-CM | POA: Diagnosis present

## 2018-08-02 DIAGNOSIS — E1165 Type 2 diabetes mellitus with hyperglycemia: Secondary | ICD-10-CM | POA: Diagnosis not present

## 2018-08-02 DIAGNOSIS — Z9981 Dependence on supplemental oxygen: Secondary | ICD-10-CM | POA: Diagnosis not present

## 2018-08-02 DIAGNOSIS — J9610 Chronic respiratory failure, unspecified whether with hypoxia or hypercapnia: Secondary | ICD-10-CM | POA: Diagnosis not present

## 2018-08-02 DIAGNOSIS — I4891 Unspecified atrial fibrillation: Secondary | ICD-10-CM | POA: Diagnosis present

## 2018-08-02 DIAGNOSIS — I82B22 Chronic embolism and thrombosis of left subclavian vein: Secondary | ICD-10-CM | POA: Diagnosis present

## 2018-08-02 DIAGNOSIS — J9612 Chronic respiratory failure with hypercapnia: Secondary | ICD-10-CM

## 2018-08-02 DIAGNOSIS — Z6838 Body mass index (BMI) 38.0-38.9, adult: Secondary | ICD-10-CM | POA: Diagnosis not present

## 2018-08-02 DIAGNOSIS — I4821 Permanent atrial fibrillation: Secondary | ICD-10-CM

## 2018-08-02 DIAGNOSIS — I428 Other cardiomyopathies: Secondary | ICD-10-CM | POA: Diagnosis present

## 2018-08-02 DIAGNOSIS — I5022 Chronic systolic (congestive) heart failure: Secondary | ICD-10-CM | POA: Diagnosis present

## 2018-08-02 DIAGNOSIS — J9622 Acute and chronic respiratory failure with hypercapnia: Secondary | ICD-10-CM | POA: Diagnosis not present

## 2018-08-02 DIAGNOSIS — E872 Acidosis: Secondary | ICD-10-CM | POA: Diagnosis not present

## 2018-08-02 DIAGNOSIS — I447 Left bundle-branch block, unspecified: Secondary | ICD-10-CM | POA: Diagnosis present

## 2018-08-02 DIAGNOSIS — F17210 Nicotine dependence, cigarettes, uncomplicated: Secondary | ICD-10-CM | POA: Diagnosis present

## 2018-08-02 DIAGNOSIS — Z9119 Patient's noncompliance with other medical treatment and regimen: Secondary | ICD-10-CM | POA: Diagnosis not present

## 2018-08-02 DIAGNOSIS — Z9049 Acquired absence of other specified parts of digestive tract: Secondary | ICD-10-CM | POA: Diagnosis not present

## 2018-08-02 DIAGNOSIS — I110 Hypertensive heart disease with heart failure: Secondary | ICD-10-CM | POA: Diagnosis present

## 2018-08-02 DIAGNOSIS — E785 Hyperlipidemia, unspecified: Secondary | ICD-10-CM | POA: Diagnosis present

## 2018-08-02 DIAGNOSIS — E669 Obesity, unspecified: Secondary | ICD-10-CM | POA: Diagnosis present

## 2018-08-02 DIAGNOSIS — J449 Chronic obstructive pulmonary disease, unspecified: Secondary | ICD-10-CM | POA: Diagnosis present

## 2018-08-02 DIAGNOSIS — Z811 Family history of alcohol abuse and dependence: Secondary | ICD-10-CM | POA: Diagnosis not present

## 2018-08-02 DIAGNOSIS — Z7901 Long term (current) use of anticoagulants: Secondary | ICD-10-CM | POA: Diagnosis not present

## 2018-08-02 DIAGNOSIS — Z4502 Encounter for adjustment and management of automatic implantable cardiac defibrillator: Secondary | ICD-10-CM | POA: Diagnosis not present

## 2018-08-02 HISTORY — DX: Unspecified atrial fibrillation: I48.91

## 2018-08-02 HISTORY — PX: AV NODE ABLATION: EP1193

## 2018-08-02 LAB — GLUCOSE, CAPILLARY
Glucose-Capillary: 152 mg/dL — ABNORMAL HIGH (ref 70–99)
Glucose-Capillary: 168 mg/dL — ABNORMAL HIGH (ref 70–99)
Glucose-Capillary: 162 mg/dL — ABNORMAL HIGH (ref 70–99)
Glucose-Capillary: 250 mg/dL — ABNORMAL HIGH (ref 70–99)
Glucose-Capillary: 199 mg/dL — ABNORMAL HIGH (ref 70–99)

## 2018-08-02 LAB — TYPE AND SCREEN
ABO/RH(D): A POS
Antibody Screen: NEGATIVE
Unit division: 0
Unit division: 0

## 2018-08-02 LAB — BLOOD GAS, ARTERIAL
Acid-Base Excess: 17.1 mmol/L — ABNORMAL HIGH (ref 0.0–2.0)
Bicarbonate: 44 mmol/L — ABNORMAL HIGH (ref 20.0–28.0)
O2 Content: 2 L/min
O2 Saturation: 92.3 %
PCO2 ART: 89.5 mmHg — AB (ref 32.0–48.0)
Patient temperature: 98.4
pH, Arterial: 7.312 — ABNORMAL LOW (ref 7.350–7.450)
pO2, Arterial: 65.5 mmHg — ABNORMAL LOW (ref 83.0–108.0)

## 2018-08-02 LAB — BPAM RBC
Blood Product Expiration Date: 202001312359
Blood Product Expiration Date: 202001312359
ISSUE DATE / TIME: 202001031444
ISSUE DATE / TIME: 202001031444
Unit Type and Rh: 6200
Unit Type and Rh: 6200

## 2018-08-02 LAB — PROTIME-INR
INR: 1.26
Prothrombin Time: 15.6 seconds — ABNORMAL HIGH (ref 11.4–15.2)

## 2018-08-02 LAB — BASIC METABOLIC PANEL
Anion gap: 4 — ABNORMAL LOW (ref 5–15)
BUN: 8 mg/dL (ref 8–23)
CO2: 43 mmol/L — ABNORMAL HIGH (ref 22–32)
Calcium: 8.6 mg/dL — ABNORMAL LOW (ref 8.9–10.3)
Chloride: 92 mmol/L — ABNORMAL LOW (ref 98–111)
Creatinine, Ser: 0.58 mg/dL — ABNORMAL LOW (ref 0.61–1.24)
GFR calc Af Amer: >60 mL/min (ref >60)
GFR calc non Af Amer: >60 mL/min (ref >60)
Glucose, Bld: 165 mg/dL — ABNORMAL HIGH (ref 70–99)
Potassium: 3.6 mmol/L (ref 3.5–5.1)
Sodium: 139 mmol/L (ref 135–145)

## 2018-08-02 SURGERY — AV NODE ABLATION

## 2018-08-02 MED ORDER — SODIUM CHLORIDE 0.9% FLUSH: 3.0000 mL | INTRAVENOUS | Status: DC | PRN

## 2018-08-02 MED ORDER — HEPARIN (PORCINE) IN NACL 1000-0.9 UT/500ML-% IV SOLN
INTRAVENOUS | Status: AC
  Filled 2018-08-02: qty 500

## 2018-08-02 MED ORDER — HEPARIN (PORCINE) IN NACL 1000-0.9 UT/500ML-% IV SOLN
INTRAVENOUS | Status: DC | PRN
  Administered 2018-08-02: 500 mL

## 2018-08-02 MED ORDER — FENTANYL CITRATE (PF) 100 MCG/2ML IJ SOLN
INTRAMUSCULAR | Status: AC
  Filled 2018-08-02: qty 2

## 2018-08-02 MED ORDER — SODIUM CHLORIDE 0.9% FLUSH
3.0000 mL | Freq: Two times a day (BID) | INTRAVENOUS | Status: DC
  Administered 2018-08-02 – 2018-08-03 (×2): 3 mL via INTRAVENOUS

## 2018-08-02 MED ORDER — UMECLIDINIUM-VILANTEROL 62.5-25 MCG/INH IN AEPB
1.0000 | INHALATION_SPRAY | Freq: Every day | RESPIRATORY_TRACT | Status: DC
  Administered 2018-08-03: 1 via RESPIRATORY_TRACT
  Filled 2018-08-02: qty 14

## 2018-08-02 MED ORDER — BUPIVACAINE HCL (PF) 0.25 % IJ SOLN
INTRAMUSCULAR | Status: AC
  Filled 2018-08-02: qty 30

## 2018-08-02 MED ORDER — ACETAMINOPHEN 325 MG PO TABS: 650.0000 mg | ORAL_TABLET | ORAL | Status: DC | PRN

## 2018-08-02 MED ORDER — MIDAZOLAM HCL 5 MG/5ML IJ SOLN
INTRAMUSCULAR | Status: AC
  Filled 2018-08-02: qty 5

## 2018-08-02 MED ORDER — ONDANSETRON HCL 4 MG/2ML IJ SOLN: 4.0000 mg | Freq: Four times a day (QID) | INTRAMUSCULAR | Status: DC | PRN

## 2018-08-02 MED ORDER — WARFARIN - PHARMACIST DOSING INPATIENT: Freq: Every day | Status: DC

## 2018-08-02 MED ORDER — SODIUM CHLORIDE 0.9 % IV SOLN: 250.0000 mL | INTRAVENOUS | Status: DC | PRN

## 2018-08-02 MED ORDER — BUPIVACAINE HCL (PF) 0.25 % IJ SOLN
INTRAMUSCULAR | Status: DC | PRN
  Administered 2018-08-02: 30 mL

## 2018-08-02 SURGICAL SUPPLY — 7 items
BAG SNAP BAND KOVER 36X36 (MISCELLANEOUS) ×2 IMPLANT
CATH CELSIUS THERMO F CV 7FR (ABLATOR) ×2 IMPLANT
HOVERMATT SINGLE USE (MISCELLANEOUS) ×2 IMPLANT
PACK EP LATEX FREE (CUSTOM PROCEDURE TRAY) ×3
PACK EP LF (CUSTOM PROCEDURE TRAY) ×1 IMPLANT
PAD PRO RADIOLUCENT 2001M-C (PAD) ×3 IMPLANT
SHEATH PINNACLE 8F 10CM (SHEATH) ×2 IMPLANT

## 2018-08-02 NOTE — Progress Notes (Signed)
ANTICOAGULATION CONSULT NOTE - Initial Consult  Pharmacy Consult for warfarin Indication: atrial fibrillation  Allergies  Allergen Reactions  . Ivp Dye [Iodinated Diagnostic Agents] Other (See Comments)    Constipation x 7 days    Patient Measurements: Height: 5\' 7"  (170.2 cm) Weight: 244 lb 12.8 oz (111 kg) IBW/kg (Calculated) : 66.1  Vital Signs: Temp: 98.4 F (36.9 C) (01/06 0749) Temp Source: Oral (01/06 0749) BP: 108/90 (01/06 0749) Pulse Rate: 111 (01/06 0749)  Labs: Recent Labs    07/30/18 1245 07/31/18 0356 08/01/18 0444 08/02/18 0700  HGB 12.9*  --   --   --   HCT 41.7  --   --   --   PLT 260  --   --   --   LABPROT 14.8 14.3 15.3* 15.6*  INR 1.17 1.12 1.22 1.26  CREATININE 0.67  --   --   --     Estimated Creatinine Clearance: 106.6 mL/min (by C-G formula based on SCr of 0.67 mg/dL).  Assessment: CC/HPI: 68 yo m presenting for device upgrade  PMH: HF LBBB HLD ICM HTN HLD DM2 A fib on warfarin  Anticoag: Warfarin (on PTA) for afib INR 1.26  Home dose: 5mg  TTS. 6mg  MWF  CV: occluded left SCV in the setting of need for a biv ICD upgrade  Renal: SCr 0.67  Heme/Onc: H&H 12.9/41.7, Plt 260  Goal of Therapy:  INR 2-3 Monitor platelets by anticoagulation protocol: Yes   Plan:  Warfarin 5 mg TTSS, 6 mg AOD Daily INR Would give higher dose Tues if INR still doesn't budge  Levester Fresh, PharmD, BCPS, BCCCP Clinical Pharmacist [phone]  Please check AMION for all [place] numbers  08/02/2018 8:42 AM

## 2018-08-02 NOTE — Consult Note (Addendum)
NAME:  Jaime Scott, MRN:  [id number], DOB:  [birth year], LOS: 0 ADMISSION DATE:  07/30/2018, CONSULTATION DATE: 08/01/2018 REFERRING MD: Cristopher Peru MD  , CHIEF COMPLAINT: Hypoxic respiratory failure  Brief History   68 year old with chronic atrial fibrillation, congestive heart failure, COPD Admitted on 07/30/18 for ICD upgrade and AV nodal ablation.  Pulmonary consulted for hypoxic, hypercarbic respiratory failure requiring intermittent BiPAP and high flow oxygen.   He has history of COPD but does not see a pulmonologist.  Has 60-pack-year smoking history and is an active smoker. Has been on 4 L oxygen at home for several years and is maintained on just Atrovent nebulizer. Worked in [employer] with no known exposures.  Past Medical History   has a past medical history of Alcohol abuse, Arthritis, CHF (congestive heart failure) ([place]) (03/12/2017), Chronic atrial fibrillation (03/12/2017), COPD (chronic obstructive pulmonary disease) ([place]) (03/12/2017), Dyspnea, Essential hypertension (03/12/2017), Hyperlipidemia (03/12/2017), Ischemic cardiomyopathy (03/12/2017), Long term (current) use of anticoagulants (03/12/2017), Peripheral vascular disease ([place]) (03/12/2017), Presence of combination internal cardiac defibrillator (ICD) and pacemaker (03/12/2017), and Type 2 diabetes mellitus without complications ([place]) (0/01/6225).  Significant Hospital Events   07/30/18- BivICD upgrade 08/02/18- AV nodal ablation  Consults:  PCCM 1/5  Procedures:    Significant Diagnostic Tests:  Chest x-ray 07/31/2018- moderate cardiac enlargement, vascular congestion.  Reviewed images personally.  ABG 07/30/2018- 7.25/72/68/91%  Micro Data:    Antimicrobials:     Interim history/subjective:    Objective   Blood pressure 112/61, pulse 79, temperature 98.2 F (36.8 C), temperature source Oral, resp. rate (!) 31, height 5\' 7"  (1.702 m), weight 111 kg, SpO2 92 %.        Intake/Output Summary (Last 24 hours) at  08/02/2018 1618 Last data filed at 08/02/2018 0800 Gross per 24 hour  Intake 600 ml  Output 2000 ml  Net -1400 ml   Filed Weights   07/30/18 1219 08/01/18 0555 08/02/18 0456  Weight: 106.6 kg 110.4 kg 111 kg    Examination: Gen:      No acute distress HEENT:  EOMI, sclera anicteric Neck:     No masses; no thyromegaly Lungs:    Diminished air entry, no wheeze. CV:         Regular rate and rhythm; no murmurs Abd:      + bowel sounds; soft, non-tender; no palpable masses, no distension Ext:    No edema; adequate peripheral perfusion Skin:      Warm and dry; no rash Neuro: alert and oriented x 3 Psych: normal mood and affect  Resolved Hospital Problem list     Assessment & Plan:  68 year old active smoker with, hypoxic, hypercarbic respiratory failure He likely has significant COPD given his smoking history and presentation No PFTs on record Not actively wheezing and does not appear to be having an exacerbation of COPD No evidence of interstitial lung disease on imaging.  - Wean down high flow nasal cannula - Get out of bed and ambulate if ok from cardiology standppoint. - Start incentive spirometry - Start Anoro inhaler while inpatient - He will need outpatient follow-up in pulmonary clinic with PFTs and evaluation for COPD. - Repeat ABG to see if he still retaining CO2. - Bipap as needed  Labs   CBC: Recent Labs  Lab 07/30/18 1245  WBC 7.9  NEUTROABS 5.0  HGB 12.9*  HCT 41.7  MCV 94.3  PLT 333    Basic Metabolic Panel: Recent Labs  Lab 07/30/18 1245 08/02/18 0759  NA  136 139  K 3.6 3.6  CL 99 92*  CO2 29 43*  GLUCOSE 144* 165*  BUN 7* 8  CREATININE 0.67 0.58*  CALCIUM 8.7* 8.6*   GFR: Estimated Creatinine Clearance: 106.6 mL/min (A) (by C-G formula based on SCr of 0.58 mg/dL (L)). Recent Labs  Lab 07/30/18 1245  WBC 7.9    Liver Function Tests: No results for input(s): AST, ALT, ALKPHOS, BILITOT, PROT, ALBUMIN in the last 168 hours. No results  for input(s): LIPASE, AMYLASE in the last 168 hours. No results for input(s): AMMONIA in the last 168 hours.  ABG    Component Value Date/Time   PHART 7.249 (L) 07/30/2018 1925   PCO2ART 72.3 (HH) 07/30/2018 1925   PO2ART 68.1 (L) 07/30/2018 1925   HCO3 30.5 (H) 07/30/2018 1925   O2SAT 90.6 07/30/2018 1925     Coagulation Profile: Recent Labs  Lab 07/30/18 1245 07/31/18 0356 08/01/18 0444 08/02/18 0700  INR 1.17 1.12 1.22 1.26    Cardiac Enzymes: No results for input(s): CKTOTAL, CKMB, CKMBINDEX, TROPONINI in the last 168 hours.  HbA1C: No results found for: HGBA1C  CBG: Recent Labs  Lab 08/01/18 1638 08/01/18 2134 08/02/18 0745 08/02/18 1146 08/02/18 1452  GLUCAP 187* 178* 152* 168* 162*    Review of Systems:    All negative; except for those that are bolded, which indicate positives.  Constitutional: weight loss, weight gain, night sweats, fevers, chills, fatigue, weakness.  HEENT: headaches, sore throat, sneezing, nasal congestion, post nasal drip, difficulty swallowing, tooth/dental problems, visual complaints, visual changes, ear aches. Neuro: difficulty with speech, weakness, numbness, ataxia. CV:  chest pain, orthopnea, PND, swelling in lower extremities, dizziness, palpitations, syncope.  Resp: cough, hemoptysis, dyspnea, wheezing. GI: heartburn, indigestion, abdominal pain, nausea, vomiting, diarrhea, constipation, change in bowel habits, loss of appetite, hematemesis, melena, hematochezia.  GU: dysuria, change in color of urine, urgency or frequency, flank pain, hematuria. MSK: joint pain or swelling, decreased range of motion. Psych: change in mood or affect, depression, anxiety, suicidal ideations, homicidal ideations. Skin: rash, itching, bruising.  Past Medical History  He,  has a past medical history of Alcohol abuse, Arthritis, CHF (congestive heart failure) ([place]) (03/12/2017), Chronic atrial fibrillation (03/12/2017), COPD (chronic obstructive  pulmonary disease) ([place]) (03/12/2017), Dyspnea, Essential hypertension (03/12/2017), Hyperlipidemia (03/12/2017), Ischemic cardiomyopathy (03/12/2017), Long term (current) use of anticoagulants (03/12/2017), Peripheral vascular disease ([place]) (03/12/2017), Presence of combination internal cardiac defibrillator (ICD) and pacemaker (03/12/2017), and Type 2 diabetes mellitus without complications ([place]) (2/99/3716).   Surgical History    Past Surgical History:  Procedure Laterality Date  . BIV PACEMAKER GENERATOR CHANGEOUT N/A 05/27/2018   Procedure: BIV PACEMAKER GENERATOR UPGRADE - Aborted;  Surgeon: Constance Haw, MD;  Location: [facility];  Service: Cardiovascular;  Laterality: N/A;  . CHOLECYSTECTOMY    . COLONOSCOPY    . ERCP     stone removal  . EXCISION OF BREAST BIOPSY     Age 37, benign     Social History   reports that he has been smoking cigarettes. He has been smoking about 1.00 pack per day. He has never used smokeless tobacco. He reports that he does not drink alcohol or use drugs.   Family History   His family history includes Alcohol abuse in his brother and brother; Breast cancer in his sister.   Allergies Allergies  Allergen Reactions  . Ivp Dye [Iodinated Diagnostic Agents] Other (See Comments)    Constipation x 7 days     Home Medications  Prior to Admission medications   Medication Sig Start Date End Date Taking? Authorizing Provider  carvedilol (COREG) 6.25 MG tablet Take 6.25 mg by mouth 2 (two) times daily with a meal.   Yes [provider]  digoxin (LANOXIN) 0.125 MG tablet Take 0.125 mg by mouth daily.   Yes [provider]  diltiazem (CARDIZEM) 30 MG tablet Take 30 mg by mouth 2 (two) times daily.   Yes [provider]  diphenhydrAMINE (BENADRYL) 25 MG tablet Take 25 mg by mouth every 6 (six) hours as needed for allergies.    Yes [provider]  enalapril (VASOTEC) 5 MG tablet Take 1 tablet (5 mg total) by mouth  daily. 11/16/17  Yes Richardo Priest, MD  furosemide (LASIX) 80 MG tablet Take 1.5 tablets (120 mg) in the morning, and 1 tablet (80mg ) in the evening Patient taking differently: Take 80-120 mg by mouth See admin instructions. Take 1.5 tablets (120 mg) in the morning, and 1 tablet (80mg ) in the evening 03/15/18  Yes Munley, Hilton Cork, MD  ipratropium (ATROVENT) 0.02 % nebulizer solution Take 0.5 mg by nebulization every 6 (six) hours as needed for wheezing or shortness of breath.   Yes [provider]  metFORMIN (GLUCOPHAGE-XR) 500 MG 24 hr tablet Take 1,000 mg by mouth 2 (two) times daily.    Yes [provider]  potassium chloride SA (K-DUR,KLOR-CON) 20 MEQ tablet Take 20 mEq by mouth every evening.    Yes [provider]  simvastatin (ZOCOR) 20 MG tablet Take 20 mg by mouth every evening.    Yes [provider]  warfarin (COUMADIN) 1 MG tablet Take 1 mg by mouth every Monday, Wednesday, and Friday at 8 PM. Take 1 tablet (1mg ) with 5 mg tablet on Mondays, Wednesdays, & Fridays   Yes [provider]  warfarin (COUMADIN) 5 MG tablet Take 5 mg by mouth daily at 8 pm.   Yes [provider]  predniSONE (DELTASONE) 50 MG tablet Take 1 tablet (50 mg total) 13 hours, 7 hours and 1 hour prior to your procedure. Patient not taking: Reported on 07/22/2018 05/21/18   Constance Haw, MD    The patient is critically ill with multiple organ system failure and requires high complexity decision making for assessment and support, frequent evaluation and titration of therapies, advanced monitoring, review of radiographic studies and interpretation of complex data.   Critical Care Time devoted to patient care services, exclusive of separately billable procedures, described in this note is 35 minutes.   Marshell Garfinkel MD [facility] Pager [phone] If no answer call [phone] 08/02/2018, 4:58 PM

## 2018-08-02 NOTE — Progress Notes (Addendum)
Site area: RFV Site Prior to Removal:  Level 0 Pressure Applied For: 15 min Manual:   yes Patient Status During Pull:  stable Post Pull Site:  Level 0 Post Pull Instructions Given:  yes Post Pull Pulses Present: palpable Dressing Applied:  clear Bedrest begins @ 1515 to 2115 Comments: by Dalbert Batman

## 2018-08-02 NOTE — Interval H&P Note (Signed)
History and Physical Interval Note:  08/02/2018 1:10 PM  Jaime Scott  has presented today for surgery, with the diagnosis of afib  The various methods of treatment have been discussed with the patient and family. After consideration of risks, benefits and other options for treatment, the patient has consented to  Procedure(s): AV NODE ABLATION (N/A) as a surgical intervention .  The patient's history has been reviewed, patient examined, no change in status, stable for surgery.  I have reviewed the patient's chart and labs.  Questions were answered to the patient's satisfaction.     Cristopher Peru

## 2018-08-02 NOTE — Progress Notes (Addendum)
Progress Note  Patient Name: Jaime Scott Date of Encounter: 08/02/2018  Primary Cardiologist: Dr. Bettina Gavia Electrophysiologist; Dr. Curt Bears  Subjective   Just waking, initially needed some re-orientation, though AAO x4 once fully awake.  No CP, no site pain, remains SOB he feels beyond his baseline.  Reports OOB to chair yesterday, has not ambulated yet.  Inpatient Medications    Scheduled Meds: . sodium chloride   Intravenous Once  . digoxin  0.125 mg Oral Daily  . furosemide  120 mg Oral Daily   And  . furosemide  80 mg Oral Daily  . ipratropium  0.5 mg Nebulization Q8H  . levalbuterol  0.63 mg Nebulization Q8H  . losartan  50 mg Oral Daily  . metoprolol succinate  50 mg Oral Daily  . potassium chloride SA  20 mEq Oral QPM  . simvastatin  20 mg Oral QPM  . warfarin  5 mg Oral Q T,Th,S,Su-1800   And  . warfarin  6 mg Oral Q M,W,F-1800  . Warfarin - Physician Dosing Inpatient   Does not apply q1800   Continuous Infusions:  PRN Meds: diphenhydrAMINE, ipratropium, ondansetron (ZOFRAN) IV   Vital Signs    Vitals:   08/01/18 2322 08/02/18 0012 08/02/18 0456 08/02/18 0640  BP:  109/73 127/74   Pulse: 97 85 (!) 105   Resp: (!) 34     Temp:   98 F (36.7 C)   TempSrc:   Oral   SpO2: 97% 98% 100% 98%  Weight:   111 kg   Height:        Intake/Output Summary (Last 24 hours) at 08/02/2018 0737 Last data filed at 08/01/2018 2200 Gross per 24 hour  Intake 840 ml  Output 1700 ml  Net -860 ml   Filed Weights   07/30/18 1219 08/01/18 0555 08/02/18 0456  Weight: 106.6 kg 110.4 kg 111 kg    Telemetry    Paced with variable QRS morphologies - Personally Reviewed  ECG    No new EKGs - Personally Reviewed  Physical Exam   GEN: No acute distress.   Neck: No JVD Cardiac:irreg-irreg, no murmurs, rubs, or gallops.  Respiratory: diminished throughout, no wheezing. GI: Soft, nontender, non-distended  MS: No edema; No deformity. Neuro:  Nonfocal  Psych: Normal  affect   Right (implant site) and left (extratction site) chest: both are stable, no bleeding, no hematoma R groin is soft, no bleeding, no hematoma, non-tender   Labs    Chemistry Recent Labs  Lab 07/30/18 1245  NA 136  K 3.6  CL 99  CO2 29  GLUCOSE 144*  BUN 7*  CREATININE 0.67  CALCIUM 8.7*  GFRNONAA >60  GFRAA >60  ANIONGAP 8     Hematology Recent Labs  Lab 07/30/18 1245  WBC 7.9  RBC 4.42  HGB 12.9*  HCT 41.7  MCV 94.3  MCH 29.2  MCHC 30.9  RDW 12.5  PLT 260    Cardiac EnzymesNo results for input(s): TROPONINI in the last 168 hours. No results for input(s): TROPIPOC in the last 168 hours.   BNPNo results for input(s): BNP, PROBNP in the last 168 hours.   DDimer No results for input(s): DDIMER in the last 168 hours.   Radiology    Dg Chest Port 1 View Result Date: 07/31/2018 CLINICAL DATA:  AICD placement EXAM: PORTABLE CHEST 1 VIEW COMPARISON:  11/06/2015 FINDINGS: Left subclavian AICD device has been removed. Right subclavian AICD device has been placed. Tips of the leads  project over the right ventricle apex and coronary sinus. The heart is moderately enlarged. Vascular congestion without interstitial edema. No pneumothorax. IMPRESSION: Right subclavian AICD device placed without pneumothorax. Electronically Signed   By: Marybelle Killings M.D.   On: 07/31/2018 12:19    Cardiac Studies   Myoview 03/25/18  Review of the above records today demonstrates:  1.  Infarction involving the inferior, septal, anteroseptal, and apical walls 2.  Anterior and inferior wall hypokinesis, septal wall dyskinesis.  Mild to moderate left ventricular dilation. 3.  Left ventricular ejection fraction 25%  Patient Profile     68 y.o. male w/PMHx of NICM, chronic CHF (systolic), LBBB, DM, HTN, HLD, permanent AFib, and COPD is s/p ICD system extraction (left side), new implant of CRT-D on Right  Assessment & Plan    1. NICM 2. LBBB 3. Chronic CHF (systolic)     ICD in place  with priro failed attempts at CRT upgrade     Now is s/p ICD system extraction (unable to maintain vascular access on left >> new CRT-D right side     POD # 3 today     Device interrogation POD #1 with intact function     CXR POD #1 without ptx  4. Chronic resp failure 5. COPD on home O2     Increased SOB from baseline     Fluid neg cumulatively -915, yesterday alone neg -169ml, getting home lasix dosing +     Coreg change to Toprol, more beta 1 selective yesterday     Pulmonary consult requested, is pending      6. Permanent AFib     CHA2DS2Vasc is 4, on warfarin     INR yesterday 1.22, pending today's     Will consult pharmacy for management while here, POD # 3 today, (do not bridge), ok to gradual therapeutic     Rates 90's-low 100's, differing morphologies to pacing, likely trigger pacing/fusion, not always true BiVe pacing.     Dr. Curt Bears has discussed AVNode ablation with the patient this morning he is agreeable     Will plan for this afternoon (with Dr. Lovena Le)     For questions or updates, please contact [facility] Please consult [email or web address] for contact info under        Signed, Baldwin Jamaica, PA-C  08/02/2018, 7:37 AM    I have seen and examined this patient with [name].  Agree with above, note added to reflect my findings.  On exam, iRRR, no murmurs, lungs clear.  Patient admitted for device extraction with reimplantation.  He had reimplantation of a CRT-D on the right side.  He remains in atrial fibrillation which is permanent.  Due to that, and to improve his CRT pacing, we will plan for AV node ablation today with Dr. Lovena Le.  Will M. Camnitz MD 08/02/2018 10:16 AM

## 2018-08-03 ENCOUNTER — Encounter (HOSPITAL_COMMUNITY): Payer: Self-pay | Admitting: Internal Medicine

## 2018-08-03 DIAGNOSIS — J9610 Chronic respiratory failure, unspecified whether with hypoxia or hypercapnia: Secondary | ICD-10-CM

## 2018-08-03 DIAGNOSIS — I482 Chronic atrial fibrillation, unspecified: Secondary | ICD-10-CM

## 2018-08-03 LAB — BLOOD GAS, ARTERIAL
Acid-Base Excess: 16.4 mmol/L — ABNORMAL HIGH (ref 0.0–2.0)
Bicarbonate: 42.3 mmol/L — ABNORMAL HIGH (ref 20.0–28.0)
Drawn by: 30136
FIO2: 32
O2 Saturation: 95.3 %
PCO2 ART: 71.2 mmHg — AB (ref 32.0–48.0)
PO2 ART: 73.9 mmHg — AB (ref 83.0–108.0)
Patient temperature: 98.1
pH, Arterial: 7.39 (ref 7.350–7.450)

## 2018-08-03 LAB — GLUCOSE, RANDOM: Glucose, Bld: 289 mg/dL — ABNORMAL HIGH (ref 70–99)

## 2018-08-03 LAB — GLUCOSE, CAPILLARY
Glucose-Capillary: 182 mg/dL — ABNORMAL HIGH (ref 70–99)
Glucose-Capillary: 484 mg/dL — ABNORMAL HIGH (ref 70–99)
Glucose-Capillary: 269 mg/dL — ABNORMAL HIGH (ref 70–99)

## 2018-08-03 LAB — PROTIME-INR
INR: 1.29
PROTHROMBIN TIME: 16 s — AB (ref 11.4–15.2)

## 2018-08-03 MED ORDER — METFORMIN HCL ER 500 MG PO TB24
1000.0000 mg | ORAL_TABLET | Freq: Two times a day (BID) | ORAL | Status: DC
  Administered 2018-08-03: 1000 mg via ORAL
  Filled 2018-08-03: qty 2

## 2018-08-03 MED ORDER — UMECLIDINIUM-VILANTEROL 62.5-25 MCG/INH IN AEPB: 1.0000 | INHALATION_SPRAY | Freq: Every day | RESPIRATORY_TRACT | 0 refills | Status: DC

## 2018-08-03 MED ORDER — WARFARIN SODIUM 7.5 MG PO TABS: 7.5000 mg | ORAL_TABLET | Freq: Once | ORAL | Status: DC

## 2018-08-03 MED ORDER — METOPROLOL SUCCINATE ER 25 MG PO TB24: 25.0000 mg | ORAL_TABLET | Freq: Every day | ORAL | 6 refills | Status: DC

## 2018-08-03 MED ORDER — LEVALBUTEROL HCL 0.63 MG/3ML IN NEBU
0.6300 mg | INHALATION_SOLUTION | Freq: Three times a day (TID) | RESPIRATORY_TRACT | Status: DC
  Administered 2018-08-03 (×2): 0.63 mg via RESPIRATORY_TRACT
  Filled 2018-08-03 (×2): qty 3

## 2018-08-03 MED ORDER — INSULIN ASPART 100 UNIT/ML ~~LOC~~ SOLN: 0.0000 [IU] | Freq: Three times a day (TID) | SUBCUTANEOUS | Status: DC

## 2018-08-03 MED ORDER — METFORMIN HCL ER 500 MG PO TB24: 1000.0000 mg | ORAL_TABLET | Freq: Two times a day (BID) | ORAL | Status: DC

## 2018-08-03 MED FILL — Midazolam HCl Inj 5 MG/5ML (Base Equivalent): INTRAMUSCULAR | Qty: 5 | Status: AC

## 2018-08-03 MED FILL — Fentanyl Citrate Preservative Free (PF) Inj 100 MCG/2ML: INTRAMUSCULAR | Qty: 2 | Status: AC

## 2018-08-03 NOTE — Progress Notes (Signed)
Patient discharged from unit with belongings and on 3L O2. IV removed intact. Reviewed discharge packet and answered questions.

## 2018-08-03 NOTE — Progress Notes (Signed)
[place] for warfarin Indication: atrial fibrillation  Allergies  Allergen Reactions  . Ivp Dye [Iodinated Diagnostic Agents] Other (See Comments)    Constipation x 7 days    Patient Measurements: Height: 5\' 7"  (170.2 cm) Weight: 245 lb 12.8 oz (111.5 kg) IBW/kg (Calculated) : 66.1  Vital Signs: Temp: 98.1 F (36.7 C) (01/07 0450) Temp Source: Oral (01/07 0450) BP: 93/63 (01/07 0450) Pulse Rate: 81 (01/07 0450)  Labs: Recent Labs    08/01/18 0444 08/02/18 0700 08/02/18 0759 08/03/18 0407  LABPROT 15.3* 15.6*  --  16.0*  INR 1.22 1.26  --  1.29  CREATININE  --   --  0.58*  --     Estimated Creatinine Clearance: 106.8 mL/min (A) (by C-G formula based on SCr of 0.58 mg/dL (L)).  Assessment: CC/HPI: 68 yo male s/p  biv ICD upgrade on 1/3. He is on Warfarin (on PTA) for afib INR 1.29  Home dose: 5mg  TTS. 6mg  MWF  Goal of Therapy:  INR 2-3 Monitor platelets by anticoagulation protocol: Yes   Plan:  Warfarin 7.5mg  today Daily PT/INR  Hildred Laser, PharmD Clinical Pharmacist **Pharmacist phone directory can now be found on [email or web address] ([email or web address]).  Listed under [place].

## 2018-08-03 NOTE — Progress Notes (Signed)
Patient had a 5 beat run of V-Tach but was found sleeping with no S/S of distress and VSS, will continue to monitor.

## 2018-08-03 NOTE — Discharge Instructions (Signed)
CONTINUE YOUR HOME OXYGEN (NASULA CANNULA) AT 3 LITER/MINUTE UNTIL YOU ARE SEEN BY PULMONOLOGY NEXT WEEK FOR FURTHER RECOMMENDATIONS.  Right groin site care instructions No lifting over 5 lbs for 1 week. No sexual activity for 1 week.  Keep procedure site clean & dry. If you notice increased pain, swelling, bleeding or pus, call/return!  No soaking baths/hot tubs/pools for 1 week.  You can remove the bandage today        Supplemental Discharge Instructions for  Pacemaker/Defibrillator Patients  Activity No heavy lifting or vigorous activity with your right arm for 6 to 8 weeks.  Do not raise your right arm above your head for one week.  Gradually raise your affected arm as drawn below.              08/03/18                       08/04/18                    08/05/18                     08/16/17 __  NO DRIVING for  1 week   ; you may begin driving on  11/04/79  .  WOUND CARE - Keep the wound area clean and dry.  Do not get this area wet for one week. No showers for one week; you may shower on  08/16/2018   . - The tape/steri-strips on your wound will fall off; do not pull them off.  No bandage is needed on the site.  DO  NOT apply any creams, oils, or ointments to the wound area. - If you notice any drainage or discharge from the wound, any swelling or bruising at the site, or you develop a fever > 101? F after you are discharged home, call the office at once.  Special Instructions - You are still able to use cellular telephones; use the ear opposite the side where you have your pacemaker/defibrillator.  Avoid carrying your cellular phone near your device. - When traveling through airports, show security personnel your identification card to avoid being screened in the metal detectors.  Ask the security personnel to use the hand wand. - Avoid arc welding equipment, MRI testing (magnetic resonance imaging), TENS units (transcutaneous nerve stimulators).  Call the office for questions about other  devices. - Avoid electrical appliances that are in poor condition or are not properly grounded. - Microwave ovens are safe to be near or to operate.  Additional information for defibrillator patients should your device go off: - If your device goes off ONCE and you feel fine afterward, notify the device clinic nurses. - If your device goes off ONCE and you do not feel well afterward, call 911. - If your device goes off TWICE, call 911. - If your device goes off THREE times in one day, call 911.  DO NOT DRIVE YOURSELF OR A FAMILY MEMBER WITH A DEFIBRILLATOR TO THE HOSPITAL--CALL 911.

## 2018-08-03 NOTE — Progress Notes (Addendum)
Progress Note  Patient Name: Jaime Scott Date of Encounter: 08/03/2018  Primary Cardiologist: Dr. Bettina Gavia Electrophysiologist; Dr. Curt Bears  Subjective   He feels well, denies any CP, feels like he doesn't need the O2 or BIPAP.  Denies any ongoing overt SOB, wants to go home.  Inpatient Medications    Scheduled Meds: . sodium chloride   Intravenous Once  . digoxin  0.125 mg Oral Daily  . furosemide  120 mg Oral Daily   And  . furosemide  80 mg Oral Daily  . levalbuterol  0.63 mg Nebulization TID  . losartan  50 mg Oral Daily  . metoprolol succinate  50 mg Oral Daily  . potassium chloride SA  20 mEq Oral QPM  . simvastatin  20 mg Oral QPM  . sodium chloride flush  3 mL Intravenous Q12H  . umeclidinium-vilanterol  1 puff Inhalation Daily  . warfarin  7.5 mg Oral ONCE-1800  . Warfarin - Pharmacist Dosing Inpatient   Does not apply q1800   Continuous Infusions: . sodium chloride     PRN Meds: sodium chloride, acetaminophen, diphenhydrAMINE, ipratropium, ondansetron (ZOFRAN) IV, sodium chloride flush   Vital Signs    Vitals:   08/03/18 0140 08/03/18 0450 08/03/18 0803 08/03/18 0856  BP:  93/63 110/87   Pulse: 80 81 78   Resp:      Temp:  98.1 F (36.7 C)    TempSrc:  Oral    SpO2: 96% 98%  99%  Weight:  111.5 kg    Height:        Intake/Output Summary (Last 24 hours) at 08/03/2018 1053 Last data filed at 08/02/2018 2200 Gross per 24 hour  Intake 600 ml  Output 650 ml  Net -50 ml   Filed Weights   08/01/18 0555 08/02/18 0456 08/03/18 0450  Weight: 110.4 kg 111 kg 111.5 kg    Telemetry    Paced  - Personally Reviewed  ECG    No new EKGs - Personally Reviewed  Physical Exam   GEN: No acute distress.   Neck: No JVD Cardiac: RRR, no murmurs, rubs, or gallops.  Respiratory: diminished throughout, no wheezing. GI: Soft, nontender, non-distended  MS: No edema; No deformity. Neuro:  Nonfocal  Psych: Normal affect   Right (implant site) and left  (extratction site) chest: both are stable, no bleeding, no hematoma R groin is soft, no bleeding, no hematoma, non-tender (again)   Labs    Chemistry Recent Labs  Lab 07/30/18 1245 08/02/18 0759  NA 136 139  K 3.6 3.6  CL 99 92*  CO2 29 43*  GLUCOSE 144* 165*  BUN 7* 8  CREATININE 0.67 0.58*  CALCIUM 8.7* 8.6*  GFRNONAA >60 >60  GFRAA >60 >60  ANIONGAP 8 4*     Hematology Recent Labs  Lab 07/30/18 1245  WBC 7.9  RBC 4.42  HGB 12.9*  HCT 41.7  MCV 94.3  MCH 29.2  MCHC 30.9  RDW 12.5  PLT 260    Cardiac EnzymesNo results for input(s): TROPONINI in the last 168 hours. No results for input(s): TROPIPOC in the last 168 hours.   BNPNo results for input(s): BNP, PROBNP in the last 168 hours.   DDimer No results for input(s): DDIMER in the last 168 hours.   Radiology    Dg Chest Port 1 View Result Date: 07/31/2018 CLINICAL DATA:  AICD placement EXAM: PORTABLE CHEST 1 VIEW COMPARISON:  11/06/2015 FINDINGS: Left subclavian AICD device has been removed. Right subclavian  AICD device has been placed. Tips of the leads project over the right ventricle apex and coronary sinus. The heart is moderately enlarged. Vascular congestion without interstitial edema. No pneumothorax. IMPRESSION: Right subclavian AICD device placed without pneumothorax. Electronically Signed   By: Marybelle Killings M.D.   On: 07/31/2018 12:19    Cardiac Studies   Myoview 03/25/18  Review of the above records today demonstrates:  1.  Infarction involving the inferior, septal, anteroseptal, and apical walls 2.  Anterior and inferior wall hypokinesis, septal wall dyskinesis.  Mild to moderate left ventricular dilation. 3.  Left ventricular ejection fraction 25%  Patient Profile     68 y.o. male w/PMHx of NICM, chronic CHF (systolic), LBBB, DM, HTN, HLD, permanent AFib, and COPD is s/p ICD system extraction (left side), new implant of CRT-D on Right  Assessment & Plan    1. NICM 2. LBBB 3. Chronic CHF  (systolic)     ICD in place with priro failed attempts at CRT upgrade     Now is s/p ICD system extraction (unable to maintain vascular access on left >> new CRT-D right side     POD # 4 today     Device interrogation POD #1 with intact function     CXR POD #1 without ptx  4. Chronic resp failure 5. COPD on home O2     Increased SOB from baseline     Fluid neg cumulatively -1249ml,  getting home lasix dosing      Coreg was changed to Toprol, more beta 1 selective over the weekend in effort to improve this     Pulmonary consult appreciated   Last night, patient refused BIPAP, he was observed to de-sat to 70's with associated confusion  He would not wear BIPAP, sats and mental status improved on high flow O2 at 6L In brief d/w pulm NP this AM, not likely ready for home yet from their perspective, given high O2 needs Await their final recommendations      6. Permanent AFib     Now s/p AV node ablation yesterday, 100% VP (BiVe) on telemetry, hopefully this improves CHF management     CHA2DS2Vasc is 4, on warfarin     INR 1.29, appreciate pharmacy help, no need for bridge     R groin is stable   From a cardiac perspective the patient is ready to go home, OK for OOB and ambulation post EP yesterday. I will ask case management to case likely to need some home health equipment from pulm perspective, any other home needs         For questions or updates, please contact [place] Please consult [email or web address] for contact info under        Signed, Jaime Jamaica, PA-C  08/03/2018, 10:53 AM    EP Attending  Patient seen and examined. Agree with above. The patient is better today now that his atrial fib is controlled. He will be seen by Pulmonary service. His rates are controlled and bp is stable. Unclear how much supplemental oxygen he will require. His INR is not therapeutic but I do not want him to be on lovenox due to his fresh implant. His DM is uncontrolled. He will be  restarted on metformin. It was held due to the use of IV dye with his procedure. He will need followup with Dr. Bettina Gavia and Curt Bears in [place] as an outpatient. Usual wound followup.   Jaime Scott.D.

## 2018-08-03 NOTE — Progress Notes (Signed)
Patient had taken off his Bipap mask and was found wondering outside of room, confused as to where he was and why he was here, assisted back to bed and placed him back on his high flow O2 at 8L to get his sats back up into the 90's from the high 70's to low 80's on RA. Patient knew his name, my name and tjhat he was in the hospital but was unable to tell me which hospital and what city or state that he was in, couldn't remember the month or year. Patient was assisted back to bed, reassessment done, and after his sats started to come back up, I was able to place him back on his Bipap mask, reviewed the importance of keeping it on and reoriented him to the use of his call light for assistance and placed his bed alarm on, will continue to monitor.

## 2018-08-03 NOTE — Discharge Summary (Addendum)
ELECTROPHYSIOLOGY PROCEDURE DISCHARGE SUMMARY    Patient ID: Jaime Scott,  MRN: [id number], DOB/AGE: 31-May-1951 68 y.o.  Admit date: 07/30/2018 Discharge date: 08/03/2018  Primary Care Physician: Greig Right, MD  Primary Cardiologist: Dr. Bettina Gavia Electrophysiologist: Dr. Curt Bears  Primary Discharge Diagnosis:  1. NICM 2. LBBB     S/p ICD system, extraction with new CRT-D implant Right side 3. permanent AFib     s/p AV node ablation this admission     CHA2DS2Vasc is 4, on Warfarin, monitored and managed with his PMD      3. Chronic CHF (systolcic) 4. Suspect COPD     On PM O2 at home  Secondary Discharge Diagnosis:  1. DM 2. HTN   Allergies  Allergen Reactions  . Ivp Dye [Iodinated Diagnostic Agents] Other (See Comments)    Constipation x 7 days     Procedures This Admission:  1. 07/30/2018  ICD system extraction (Dr. Lovena Le) 2.  Implantation of a new CRT ICD (RV/LV leads only) on the right side, 07/30/17 by Dr Lovena Le.  The patient received a Medtronic quadripolar LV pacing lead, serial [id number], Medtronic model [id number] active-fixation single coil defibrillation lead, serial [phone], Medtronic biventricular ICD, serial [id number] DFT's were deferred at time of implant.   There were no immediate post procedure complications. CXR on 07/01/2019 demonstrated no pneumothorax status post device implantation.  3. 08/02/2018: AV node ablation  Brief HPI: Jaime Scott is a 68 y.o. male is followed by electrophysiology in the outpatient setting.  He has had prior failed prior attempts to upgrade his ICVD to CRT device.  Referred to Dr. Lovena Le for evaluation of device upgrade options, recommended to have lead extraction to allow SVC acess (known to be occluded) and device upgrade. Risks, benefits, and alternatives to the planned procedure reviewed with the patient who wished to proceed.   Hospital Course:  The patient was admitted and underwent  extraction of his ICD system on the left side, ultimately requiring new device system to the tright side.  See procedure report for full details.  Post procedure day 1 his CXR was without ptx, device interrogation with intact function.  He c/o more then his baseline SOB, treated with additional diuretics, his metoprolol changed to Toprol with h/o COPD, thougjh required n/c O2 that he reported only used/needed at night at home.  Pulmonary was consulted to his case.  He was also noted to have AFib above his pacing.  It seems he extubated to BIPAP then high flow O2 initially, afterwards in the evenings he decline BIPAP, denies use of anything at home, only n/cO2 that he wears at night he reported 4L.  To RN chronically, to other he reported only at HS.   Anoro inhaler added to his regime by pulmonary service.  Last evening he declined BIPAP, reportedly has sats down to 70's with confusion, both improved with n/c O2.  His most recent ABG is improving.  Pulmonaryt service has seen him.  Recommend HX BIPAP though despite lengthy conversation with the patient including risks of hypercarbia, he declines home BIPAP, discussed symptoms to be aware, at the end of their discussion he refuses home HS BIPAP.  They recommended weaning his n/c O2 to a ambulation goal O2 sat of 88-92%, to continue his HS n/c O2 as he has been doing, they have early follow up with them (in place for Monday of next week) arranged.  Today his BS by CBG was acutely elevated to 484, a repeat  was 269, and by lab 289.  He had been off his metformin during his stay.  This was resumed.  He maintained goal ambulatory O2 sats at 3L/min.  The patient reports he has O2 at home as well as /c and does not need anything for home. He was monitored on telemetry he is V paced (device set DDD 80bpm).  Both chest sites are stable, no hematoma or ecchymosis. Wound care, arm mobility, and restrictions were reviewed with the patient.  The patient denies any CP, denies any  rest SOB, reports feeling well and wants to go home, he was examined by Dr. Lovena Le and considered stable for discharge to home.   His INR today remains 1.29, He has a standing order with [facility] for INR checks and managed with his PMD, Dr. Lovena Le wants him to continue his home regime to gradual therapeutic levels.  I have instructed him to have INR check on Thursday and defer to his PMD adjustment if needed at that time. We will stop his dig and reduce his toprol given AV node ablation.  Physical Exam: Vitals:   08/03/18 0450 08/03/18 0803 08/03/18 0856 08/03/18 1410  BP: 93/63 110/87  102/82  Pulse: 81 78    Resp:      Temp: 98.1 F (36.7 C)     TempSrc: Oral     SpO2: 98%  99% 100%  Weight: 111.5 kg     Height:        Labs:   Lab Results  Component Value Date   WBC 7.9 07/30/2018   HGB 12.9 (L) 07/30/2018   HCT 41.7 07/30/2018   MCV 94.3 07/30/2018   PLT 260 07/30/2018    Recent Labs  Lab 08/02/18 0759 08/03/18 1309  NA 139  --   K 3.6  --   CL 92*  --   CO2 43*  --   BUN 8  --   CREATININE 0.58*  --   CALCIUM 8.6*  --   GLUCOSE 165* 289*    Discharge Medications:  Allergies as of 08/03/2018      Reactions   Ivp Dye [iodinated Diagnostic Agents] Other (See Comments)   Constipation x 7 days      Medication List    STOP taking these medications   carvedilol 6.25 MG tablet Commonly known as:  COREG   digoxin 0.125 MG tablet Commonly known as:  LANOXIN   diltiazem 30 MG tablet Commonly known as:  CARDIZEM   predniSONE 50 MG tablet Commonly known as:  DELTASONE     TAKE these medications   diphenhydrAMINE 25 MG tablet Commonly known as:  BENADRYL Take 25 mg by mouth every 6 (six) hours as needed for allergies.   enalapril 5 MG tablet Commonly known as:  VASOTEC Take 1 tablet (5 mg total) by mouth daily.   furosemide 80 MG tablet Commonly known as:  LASIX Take 1.5 tablets (120 mg) in the morning, and 1 tablet (80mg ) in the evening What changed:     how much to take  how to take this  when to take this   ipratropium 0.02 % nebulizer solution Commonly known as:  ATROVENT Take 0.5 mg by nebulization every 6 (six) hours as needed for wheezing or shortness of breath.   metFORMIN 500 MG 24 hr tablet Commonly known as:  GLUCOPHAGE-XR Take 1,000 mg by mouth 2 (two) times daily.   metoprolol succinate 25 MG 24 hr tablet Commonly known as:  TOPROL-XL Take  1 tablet (25 mg total) by mouth daily. Take with or immediately following a meal. Start taking on:  August 04, 2018   potassium chloride SA 20 MEQ tablet Commonly known as:  K-DUR,KLOR-CON Take 20 mEq by mouth every evening.   simvastatin 20 MG tablet Commonly known as:  ZOCOR Take 20 mg by mouth every evening.   umeclidinium-vilanterol 62.5-25 MCG/INH Aepb Commonly known as:  ANORO ELLIPTA Inhale 1 puff into the lungs daily. Start taking on:  August 04, 2018 Notes to patient:  Rinse mouth after use   warfarin 5 MG tablet Commonly known as:  COUMADIN Take 5 mg by mouth daily at 8 pm.   warfarin 1 MG tablet Commonly known as:  COUMADIN Take 1 mg by mouth every Monday, Wednesday, and Friday at 8 PM. Take 1 tablet (1mg ) with 5 mg tablet on Mondays, Wednesdays, & Fridays       Disposition: Home  Discharge Instructions    Diet - low sodium heart healthy   Complete by:  As directed    Increase activity slowly   Complete by:  As directed      Follow-up Information    Evans Lance, MD Follow up.   Specialty:  Cardiology Why:  11/03/2018 @ 2:00PM Contact information: [address] [place] [address] [phone]        Magdalen Spatz, NP Follow up on 08/09/2018.   Specialty:  Pulmonary Disease Why:  Appointment is at 10:30, arrive 15 minutes early Contact information: [place] [address] [phone]        [place] [facility] Follow up.   Specialty:  Cardiology Why:  08/12/2018 @ 11:00AM,  wound check visit Contact information: [address], [place]       [place] Follow up.   Specialty:  Cardiology Why:  08/26/2018 @ 10:00AM, pacemaker reprogramming visit Contact information: [address] [place]       Greig Right, MD Follow up.   Specialty:  Family Medicine Why:  Please get your coumadin check done Thursday 08/05/2018 and adjust as per your primary doctor as we discussed. Also please see your PMD in the next couple weeks to monitor/evaluate current diabetes management Contact information: [place] [address] [phone]        Constance Haw, MD .   Specialty:  Cardiology Contact information: [place] [address] [phone]           Duration of Discharge Encounter: Greater than 30 minutes including physician time.  Venetia Night, PA-C 08/03/2018 3:26 PM  EP Attending  Patient seen and examined. Agree with above. The patient is stable for DC home. His VR is well controlled in atrial fib. He has been seen by our Pulmonary team with medication recs as above. He will be discharged home with followup as outlined above.   [name].D.

## 2018-08-03 NOTE — Progress Notes (Addendum)
NAME:  Jaime Scott, MRN:  [id number], DOB:  02/04/1951, LOS: 1 ADMISSION DATE:  07/30/2018, CONSULTATION DATE: 08/01/2018 REFERRING MD: Cristopher Peru MD  , CHIEF COMPLAINT: Hypoxic respiratory failure  Brief History   68 year old current every day smoker with chronic atrial fibrillation, congestive heart failure, COPD Admitted on 07/30/18 for ICD upgrade and AV nodal ablation.  Pulmonary consulted for hypoxic, hypercarbic respiratory failure requiring intermittent BiPAP and high flow oxygen.   He has history of COPD but does not see a pulmonologist.  Has 60-pack-year smoking history and is an active smoker. Has been on 4 L oxygen at home for several years and is maintained on just Atrovent nebulizer. Worked in [employer] with no known exposures.  Past Medical History   has a past medical history of Alcohol abuse, Arthritis, CHF (congestive heart failure) ([place]) (03/12/2017), Chronic atrial fibrillation (03/12/2017), COPD (chronic obstructive pulmonary disease) ([place]) (03/12/2017), Dyspnea, Essential hypertension (03/12/2017), Hyperlipidemia (03/12/2017), Ischemic cardiomyopathy (03/12/2017), Long term (current) use of anticoagulants (03/12/2017), Peripheral vascular disease ([place]) (03/12/2017), Presence of combination internal cardiac defibrillator (ICD) and pacemaker (03/12/2017), and Type 2 diabetes mellitus without complications ([place]) (4/31/5400).  Significant Hospital Events   07/30/18- BivICD upgrade 08/02/18- AV nodal ablation  Consults:  PCCM 1/5  Procedures:    Significant Diagnostic Tests:  Chest x-ray 07/31/2018- moderate cardiac enlargement, vascular congestion.  Reviewed images personally.  ABG 07/30/2018- 7.25/72/68/91% ABG 08/02/2018- 7.31/89.5/65.5/ 92%  Micro Data:    Antimicrobials:     Interim history/subjective:  States he feels better than he has since admission. Was on BiPAP overnight " On and Off". He does not like the mask.  Objective   Blood pressure 93/63, pulse 81,  temperature 98.1 F (36.7 C), temperature source Oral, resp. rate 19, height 5\' 7"  (1.702 m), weight 111.5 kg, SpO2 99 %.        Intake/Output Summary (Last 24 hours) at 08/03/2018 0937 Last data filed at 08/02/2018 2200 Gross per 24 hour  Intake 600 ml  Output 650 ml  Net -50 ml   Filed Weights   08/01/18 0555 08/02/18 0456 08/03/18 0450  Weight: 110.4 kg 111 kg 111.5 kg    Examination: Gen:      No acute distress, sitting on side of bed reading paper, on O2 at 6L HEENT:  EOMI, sclera anicteric Neck:     No masses; no thyromegaly, No LAD, thick Lungs:    Bilateral chest excursion, no wheeze. CV:         S1, S2, Regular rate and rhythm; no murmurs, rubs or gallops Abd:      + bowel sounds; soft, non-tender; no palpable masses, ND, Obese Ext:    No edema; adequate peripheral perfusion, no obvious deformities Skin:      Warm and dry; no rash, no lesions,+  tattoos Neuro: alert and oriented x 3, MAE x 4 Psych: normal mood and affect  Resolved Hospital Problem list     Assessment & Plan:  68 year old active smoker with, hypoxic, hypercarbic respiratory failure He likely has significant COPD given his smoking history and presentation No PFTs on record Not actively wheezing and does not appear to be having an exacerbation of COPD No evidence of interstitial lung disease on imaging.  - Continue to Wean down high flow nasal cannula for sats of 88-92% - ABG now - Continue to  ambulate if ok from cardiology standppoint. - Continue  incentive spirometry - Start Anoro inhaler while inpatient - He will need close outpatient follow-up in  pulmonary clinic with PFTs and   evaluation for COPD. - ABG prn  to see if he still retaining CO2. - Bipap as needed - Will need ambulatory saturation prior to discharge to make sure he is    maintaining sat goal of 88-92% - Recommend BiPAP for OHS>> could be source of his cardiac issues    Discussion: Pt is currently on 8L [place] with sats of 99%.  Oxygen needs weaning for sats of 88-92% ( suspect undiagnosed COPD) . Last ABG shows continued retention, but improved pH with compensation.He should qualify for BiPAP based on his last ABG's, but doubt he would be compliant. ( He has only worn BiPap intermittently in the hospital). Per nursing he had AMS last pm when off BiPAP. He is awake and alert this am. Recommend ABG now , recommend ambulatory saturation with home oxygen to maintain saturations of 88-92%. Will need close follow up with pulmonary. Continue Incruse inhaler at discharge.Smoking cessation counseling.  Repeat ABG shows continued hypercarbia , with compensated pH, bicarb is 42. Consider nocturnal home BiPap at discharge.   Follow up appointment 08/09/2018 at 10:30 with Eric Form, NP  Labs   CBC: Recent Labs  Lab 07/30/18 1245  WBC 7.9  NEUTROABS 5.0  HGB 12.9*  HCT 41.7  MCV 94.3  PLT 683    Basic Metabolic Panel: Recent Labs  Lab 07/30/18 1245 08/02/18 0759  NA 136 139  K 3.6 3.6  CL 99 92*  CO2 29 43*  GLUCOSE 144* 165*  BUN 7* 8  CREATININE 0.67 0.58*  CALCIUM 8.7* 8.6*   GFR: Estimated Creatinine Clearance: 106.8 mL/min (A) (by C-G formula based on SCr of 0.58 mg/dL (L)). Recent Labs  Lab 07/30/18 1245  WBC 7.9    Liver Function Tests: No results for input(s): AST, ALT, ALKPHOS, BILITOT, PROT, ALBUMIN in the last 168 hours. No results for input(s): LIPASE, AMYLASE in the last 168 hours. No results for input(s): AMMONIA in the last 168 hours.  ABG    Component Value Date/Time   PHART 7.312 (L) 08/02/2018 1740   PCO2ART 89.5 (HH) 08/02/2018 1740   PO2ART 65.5 (L) 08/02/2018 1740   HCO3 44.0 (H) 08/02/2018 1740   O2SAT 92.3 08/02/2018 1740     Coagulation Profile: Recent Labs  Lab 07/30/18 1245 07/31/18 0356 08/01/18 0444 08/02/18 0700 08/03/18 0407  INR 1.17 1.12 1.22 1.26 1.29    Cardiac Enzymes: No results for input(s): CKTOTAL, CKMB, CKMBINDEX, TROPONINI in the last 168  hours.  HbA1C: No results found for: HGBA1C  CBG: Recent Labs  Lab 08/02/18 1146 08/02/18 1452 08/02/18 1649 08/02/18 2111 08/03/18 0740  GLUCAP 168* 162* 250* 199* 182*     Family History   His family history includes Alcohol abuse in his brother and brother; Breast cancer in his sister.   Allergies Allergies  Allergen Reactions  . Ivp Dye [Iodinated Diagnostic Agents] Other (See Comments)    Constipation x 7 days     Home Medications  Prior to Admission medications   Medication Sig Start Date End Date Taking? Authorizing Provider  carvedilol (COREG) 6.25 MG tablet Take 6.25 mg by mouth 2 (two) times daily with a meal.   Yes [provider]  digoxin (LANOXIN) 0.125 MG tablet Take 0.125 mg by mouth daily.   Yes [provider]  diltiazem (CARDIZEM) 30 MG tablet Take 30 mg by mouth 2 (two) times daily.   Yes [provider]  diphenhydrAMINE (BENADRYL) 25 MG tablet Take  25 mg by mouth every 6 (six) hours as needed for allergies.    Yes [provider]  enalapril (VASOTEC) 5 MG tablet Take 1 tablet (5 mg total) by mouth daily. 11/16/17  Yes Richardo Priest, MD  furosemide (LASIX) 80 MG tablet Take 1.5 tablets (120 mg) in the morning, and 1 tablet (80mg ) in the evening Patient taking differently: Take 80-120 mg by mouth See admin instructions. Take 1.5 tablets (120 mg) in the morning, and 1 tablet (80mg ) in the evening 03/15/18  Yes Munley, Hilton Cork, MD  ipratropium (ATROVENT) 0.02 % nebulizer solution Take 0.5 mg by nebulization every 6 (six) hours as needed for wheezing or shortness of breath.   Yes [provider]  metFORMIN (GLUCOPHAGE-XR) 500 MG 24 hr tablet Take 1,000 mg by mouth 2 (two) times daily.    Yes [provider]  potassium chloride SA (K-DUR,KLOR-CON) 20 MEQ tablet Take 20 mEq by mouth every evening.    Yes [provider]  simvastatin (ZOCOR) 20 MG tablet Take 20 mg by mouth every evening.    Yes  [provider]  warfarin (COUMADIN) 1 MG tablet Take 1 mg by mouth every Monday, Wednesday, and Friday at 8 PM. Take 1 tablet (1mg ) with 5 mg tablet on Mondays, Wednesdays, & Fridays   Yes [provider]  warfarin (COUMADIN) 5 MG tablet Take 5 mg by mouth daily at 8 pm.   Yes [provider]  predniSONE (DELTASONE) 50 MG tablet Take 1 tablet (50 mg total) 13 hours, 7 hours and 1 hour prior to your procedure. Patient not taking: Reported on 07/22/2018 05/21/18   Constance Haw, MD       Magdalen Spatz, AGACNP-BC [place] Pager # [phone] If no answer call [phone] 08/03/2018, 9:37 AM

## 2018-08-03 NOTE — Progress Notes (Addendum)
Interval Progress Note: Pulmonary Critical Care   Repeat ABG  08/03/2018 >> 7.39/ 71.2/74/42.3  Saturation 95.1 on 4 L [place]  I have asked nursing to wean oxygen for sats of 88-92%.( Suspected COPD).   I have had an extensive conversation with Jaime Scott.  I have explained that he most likely has OHS, and possibly OSA and COPD. He has not been seen by pulmonary prior to this admission. I explained that it is pulmonary recommendation based on ABG's is  that he is discharged on nocturnal BiPAP to treat his hypercarbia. He has been non-compliant with BiPAP as an inpatient.He is very honest, and states he will not wear it at home. He states he cannot tolerate the mask.He states he is compliant with his nocturnal oxygen, but states he will not wear BiPAP at home. We reviewed the risks of continued untreated OHS to include  -  the risks for hypertension, coronary artery disease, cardiac dysrhythmias, cerebrovascular disease, and diabetes and death due to hypercarbia . I explained that if he finds himself sleepy, or confused, unable to think clearly  he needs to seek emergency care immediately as it may be an indication of elevated CO2. He verbalized that he understands the above noted risks of going home without the recommended BiPAP machine. He has agreed to follow up in the Pulmonary Office with me 08/09/2018 at 10:30 as is documented in the discharge paperwork. We will need to establish COPD diagnosis with PFT's, and consider sleep polysomnogram to evaluate for OSA. I have counseled him to quit smoking. We discussed the risks of smoking while wearing oxygen and the risk of flash burns and combustion of the oxygen tank if around an open flame. He will need ambulatory oxygen saturation test prior to discharge. Saturation goals are 88-92% in light of his suspected COPD. Recommendations:  Discharge on Anoro 1 inhalation once daily, rinse mouth after use  Recommended nocturnal home BiPAP for hypercarbia,  which  patient has refused. Follow up as OP with Pulmonary 08/09/2018 @ 10:30 to evaluate for COPD, OSA. With PFT's and Sleep study to be ordered. Ambulatory Saturation prior to discharge, saturation goal is 88-92% Continue nocturnal oxygen as you were doing prior to admission, saturation goal is 88-92%. Seek emergency care for excessive sleepiness, headaches, dizziness, mental fog , fatigue,blurred vision,worsening anxiety. Work on weight loss as there is a relation between weight and OSA/OHS Do not drive if sleepy Avoid sedatives as this may increase risk of  Hypercarbia, confusion and sleepiness.  I have reviewed this with patient's RN Jaime Scott, so she can re-iterate at discharge. Discharge planning per Cards.  Follow up Pulmonary Appointment has been made for 08/09/2018 at 10:30, ask that patient arrive 15 minutes early.    Magdalen Spatz, AGACNP-BC [place] Pager # [phone] After 3 pm  Call [phone] 08/03/2018 1:36 PM

## 2018-08-03 NOTE — Progress Notes (Signed)
Ambulated patient to the end of the hallway and back on 3L O2, pt O2 sat maintained between 88-92%. Patient experienced some SOB but tolerated well. Once back in the room and sitting in bed, pt's O2 sat dropped to 85%, but recouped to 96% after approximately 1 minute.

## 2018-08-03 NOTE — Progress Notes (Signed)
RN called with post lunch CBG 484, no symptoms Ordered stat lab glucose to confirm, d/w dr. Lovena Le, sliding scale coverage oredered, resume home Metformin to give dose now and repeat CBG 3 hours after coverage.  May still discharge if cleared by pulmonary and glucose < 400 with early PMD follow up for diabetes management  [name], PA-C

## 2018-08-03 NOTE — Progress Notes (Signed)
RT explained importance and reason for BiPAP.  Patient still refusing to wear at this time.  Patient placed on 8L HFNC.  Vitals stable.  RT will continue to monitor.

## 2018-08-05 DIAGNOSIS — I4891 Unspecified atrial fibrillation: Secondary | ICD-10-CM | POA: Diagnosis not present

## 2018-08-05 DIAGNOSIS — Z7901 Long term (current) use of anticoagulants: Secondary | ICD-10-CM | POA: Diagnosis not present

## 2018-08-09 ENCOUNTER — Other Ambulatory Visit: Payer: Self-pay | Admitting: *Deleted

## 2018-08-09 ENCOUNTER — Ambulatory Visit (INDEPENDENT_AMBULATORY_CARE_PROVIDER_SITE_OTHER): Payer: Medicare Other | Admitting: Acute Care

## 2018-08-09 ENCOUNTER — Ambulatory Visit: Payer: Medicare Other

## 2018-08-09 ENCOUNTER — Ambulatory Visit (INDEPENDENT_AMBULATORY_CARE_PROVIDER_SITE_OTHER): Payer: Medicare Other | Admitting: Pulmonary Disease

## 2018-08-09 ENCOUNTER — Encounter: Payer: Self-pay | Admitting: Acute Care

## 2018-08-09 VITALS — BP 110/62 | HR 80 | Ht 67.0 in | Wt 232.8 lb

## 2018-08-09 DIAGNOSIS — J9612 Chronic respiratory failure with hypercapnia: Secondary | ICD-10-CM

## 2018-08-09 DIAGNOSIS — R0600 Dyspnea, unspecified: Secondary | ICD-10-CM

## 2018-08-09 DIAGNOSIS — F17210 Nicotine dependence, cigarettes, uncomplicated: Secondary | ICD-10-CM

## 2018-08-09 DIAGNOSIS — G4733 Obstructive sleep apnea (adult) (pediatric): Secondary | ICD-10-CM

## 2018-08-09 DIAGNOSIS — Z720 Tobacco use: Secondary | ICD-10-CM

## 2018-08-09 DIAGNOSIS — J449 Chronic obstructive pulmonary disease, unspecified: Secondary | ICD-10-CM | POA: Diagnosis not present

## 2018-08-09 DIAGNOSIS — F17200 Nicotine dependence, unspecified, uncomplicated: Secondary | ICD-10-CM

## 2018-08-09 HISTORY — DX: Obstructive sleep apnea (adult) (pediatric): G47.33

## 2018-08-09 HISTORY — DX: Tobacco use: Z72.0

## 2018-08-09 LAB — PULMONARY FUNCTION TEST
DL/VA % pred: 78 %
DL/VA: 3.44 ml/min/mmHg/L
DLCO COR % PRED: 52 %
DLCO cor: 14.79 ml/min/mmHg
DLCO unc % pred: 49 %
DLCO unc: 14.03 ml/min/mmHg
FEF 25-75 POST: 0.92 L/s
FEF 25-75 Pre: 0.46 L/sec
FEF2575-%Change-Post: 98 %
FEF2575-%Pred-Post: 39 %
FEF2575-%Pred-Pre: 19 %
FEV1-%Change-Post: 20 %
FEV1-%Pred-Post: 42 %
FEV1-%Pred-Pre: 35 %
FEV1-Post: 1.27 L
FEV1-Pre: 1.05 L
FEV1FVC-%Change-Post: -3 %
FEV1FVC-%Pred-Pre: 79 %
FEV6-%Change-Post: 24 %
FEV6-%Pred-Post: 56 %
FEV6-%Pred-Pre: 45 %
FEV6-Post: 2.16 L
FEV6-Pre: 1.74 L
FEV6FVC-%Change-Post: 0 %
FEV6FVC-%Pred-Post: 103 %
FEV6FVC-%Pred-Pre: 103 %
FVC-%CHANGE-POST: 24 %
FVC-%Pred-Post: 54 %
FVC-%Pred-Pre: 43 %
FVC-Post: 2.21 L
FVC-Pre: 1.77 L
Post FEV1/FVC ratio: 57 %
Post FEV6/FVC ratio: 98 %
Pre FEV1/FVC ratio: 59 %
Pre FEV6/FVC Ratio: 98 %
RV % pred: 135 %
RV: 3.02 L
TLC % pred: 81 %
TLC: 5.23 L

## 2018-08-09 NOTE — Progress Notes (Signed)
PFT done today. 

## 2018-08-09 NOTE — Assessment & Plan Note (Addendum)
61 pack year smoking history Current every day smoker High Risk  patient Plan Referral to [place] he would like to wait until May or June 2020. Do not smoke while wearing oxygen Cigarette smoker   We will refer you today to her lung cancer screening program >>>This is based off of your 61  pack-year smoking history >>> This is a recommendation from the [place] preventative services task force (USPSTF) >>>The USPSTF recommends annual screening for lung cancer with low-dose computed tomography (LDCT) in adults aged 2 to 80 years who have a 30 pack-year smoking history and currently smoke or have quit within the past 15 years. Screening should be discontinued once a person has not smoked for 15 years or develops a health problem that substantially limits life expectancy or the ability or willingness to have curative lung surgery.    Our office will call you and set up an appointment with Eric Form (Nurse Practitioner) who leads this program.  This appointment takes place in our office.   After completing this meeting with Eric Form NP you will get a low-dose CT as the screening >>>We will call you with those results     We recommend that you stop smoking.  >>>You need to set a quit date >>>If you have friends or family who smoke, let them know you are trying to quit and not to smoke around you or in your living environment   Smoking Cessation Resources:  1 800 QUIT NOW  >>> Patient to call this resource and utilize it to help support her quit smoking >>> Keep up your hard work with stopping smoking   You can also contact the [facility] >>>For smoking cessation classes call [phone]   We do not recommend using e-cigarettes as a form of stopping smoking   You can sign up for smoking cessation support texts and information:  >>>https://smokefree.gov/smokefreetxt   Nicotine patches: >>>Make sure you rotate sites that you do not get skin  irritation, Apply 1 patch each morning to a non-hairy skin site   If you are smoking greater than 10 cigarettes/day and weigh over 45 kg start with the nicotine patch of 21 mg a day for 6 weeks, then 14 mg a day for 2 weeks, then finished with 7 mg a day for 2 weeks, then stop   If you are smoking less than 10 cigarettes a day or weight less than 45 kg start with medium dose pack of 14 mg a day for 6 weeks, followed by 7 mg a day for 2 weeks    >>>If insomnia occurs you are having trouble sleeping you can take the patch off at night, and place a new one on in the morning >>>If the patch is removed at night and you have morning cravings start short acting nicotine replacement therapy such as gum or lozenges   Nicotine Gum:  >>>Smokers who smoke 25 or more cigarettes a day should use 4 mg dose >>>Smokers who smoke fewer than 25 cigarettes a day should use 2 mg dose   Proper chewing of gum is important for optimal results.   >>>Do not chew gum to rapidly.  Once you start chewing eating tasty peppery taste and slide the gum to your cheek.  When the taste disappears to a few more times.  Repeat this for 30 minutes.  Then discard the gum.   >>>Avoid acidic beverages such as coffee, carbonated beverages before and during gum use.  A soft acidic beverages lower oral pH which cause nicotine to not be absorbed properly >>>If you chew the gum too quickly or vigorously you could have nausea, vomiting, abdominal pain, constipation, hiccups, headache, sore jaw, mouth irritation ulcers   Nicotine lozenge: Lozenges are commonly uses short acting NRT product   >>>Smokers who smoke within 30 minutes of awakening should use 4 mg dose >>>Smokers who wait more than 30 minutes after awakening to smoke should use 2 mg dose   Can use up to 1 lozenge every 1-2 hours for 6 weeks >>>Total amount of lozenges that can be used per day as 20 >>>Gradually reduce number of lozenges used per day after 2 weeks of use    Place lozenge in mouth and allowed to dissolve for 30 minutes loss and does not need to be chewed   Lozenges have advantages to be able to be used in people with TMG, poor dentition, dentures    I have spent 4 minutes counseling patient on smoking cessation this visit. Patient verbalizes understanding of their  choice to continue smoking and the negative health consequences including worsening of COPD, risk of lung cancer , stroke and heart disease.  I have reviewed the risks of smoking while wearing oxygen

## 2018-08-09 NOTE — Assessment & Plan Note (Signed)
Currently does not use maintenance inhalers No PFT's on record Plan We will order Pulmonary Function tests. On the day of the test, do not use you inhaler until after the test is complete. We will schedule a home sleep test. We will call you with the results. We will give you a sample of Anoro. Use daily, one puff . Rinse mouth after use. Wear your oxygen to maintain oxygen saturation 88-92%. I recommend hat you get an oxygen saturation finger probe. These can be bought on [employer], [place], or [employer]. Wear your oxygen at bedtime as ordered. Follow up after PFT's>>  Please schedule on the day her returns the sleep machine. Referral to Lung Cancer Screening program in 12/2018. Note your daily symptoms > remember "red flags" for COPD:  Increase in cough, increase in sputum production, increase in shortness of breath or activity intolerance. If you notice these symptoms, please call to be seen.   Please contact office for sooner follow up if symptoms do not improve or worsen or seek emergency care

## 2018-08-09 NOTE — Progress Notes (Signed)
History of Present Illness Jaime Scott is a 68 y.o. male current every day smoker with a 61 pack year smoking history, and  suspected COPD/ OSA / OHS. He is here for a hospital follow up. He was seen in the hospital by Dr. Vaughan Browner for hypoxic, hypercarbic  respiratory failure requiring BiPAP and high flow [place].  08/09/2018   Admission 07/30/2018-08/03/2018 for extraction of his ICD system on the left side, ultimately requiring new device system to the right side and AV nodal ablation for atrial fibrillation.Pulmonary was consulted for hypoxic, hypercarbic respiratory failure requiring intermittent BiPAP and high flow oxygen.   He has history of COPD but does not see a pulmonologist. He was not taking any  inhalers.There are no PFT's on file.  Has 60-pack-year smoking history and is an active smoker. Has been on 4 L nocturnal oxygen at home for several years and is maintained on just Atrovent nebulizer, no maintenance respiratory  medications.   During this admission, patient developed hypoxic, hypercarbic respiratory failure. ? If triggered by anesthesia for procedure, vs. Exacerbation of chronic problem. It did not appear to be a COPD flare.Blood Gases revealed hypercarbia with compensated respiratory acidosis. There is high suspicion of OSA/OHS in this patient. His CO2 retention appears to be chronic as blood chemistry reveals elevated CO2 for greater than 1 year. He was treated with BiPAP, which he was non-complaint in wearing, and he required high flow nasal canula.. He stated that he cannot wear a face mask. He did have episodes of confusion through the night as an inpatient per nursing. We recommended home  BIPAP though despite lengthy conversation with the patient including risks of hypercarbia, and risks of untreated hypoxemia he declined home BIPAP. We  discussed symptoms to be aware of that indicate elevated CO2 levels. At the end of this  discussion he continued to refused home HS BIPAP. ( See  progress note dated 08/03/2018 at 13:05).  Recommendation was made for  weaning his n/c O2 to an  ambulation goal O2 sat of 88-92%, to continue his HS n/c O2 as he has been doing,continue Anoro as he had been doing in the hospital, and follow up for PFT's to stage COPD and appropriate therapy and Sleep Study to RO OSA as an outpatient. . He stated he would consider CPAP with nasal pillows if he was + for OSA. He continues to decline BiPAP.  He presents today stating he feels better. He is not wearing his oxygen in the office, but sats today were 93%, so no need. He states he is compliant with nocturnal oxygen.He was not discharged with his Anoro, and did not pick any up from the pharmacy. He also does not have an oxygen saturation probe to assess his oxygen saturations. He continues to smoke daily. He smells strongly of smoke. Incision to l and r chest look  Ok. Steri strips and dressings are intact. He has an appointment with cards this week to evaluate further. He denies any fever, chest pain, orthopnea or hemoptysis. He denies any  Wheezing, and states Coughs up white secretions 1-2 times daily. His respiratory rate is rapid today at 16. He states this is normal for him after he walks short distances.   Test Results: Results for ZACKARIAH, VANDERPOL (MRN [id number]) as of 08/09/2018 14:37  Ref. Range 08/02/2018 17:40  Delivery systems Unknown NASAL CANNULA  O2 Content Latest Units: L/min 2.0  pH, Arterial Latest Ref Range: 7.350 - 7.450  7.312 (L)  pCO2 arterial Latest Ref Range: 32.0 - 48.0 mmHg 89.5 (HH)  pO2, Arterial Latest Ref Range: 83.0 - 108.0 mmHg 65.5 (L)  Acid-Base Excess Latest Ref Range: 0.0 - 2.0 mmol/L 17.1 (H)  Bicarbonate Latest Ref Range: 20.0 - 28.0 mmol/L 44.0 (H)  O2 Saturation Latest Units: % 92.3  Patient temperature Unknown 98.4  Allens test (pass/fail) Latest Ref Range: PASS  PASS    Results for MALIQUE, DRISKILL (MRN [id number]) as of 08/09/2018 14:37  Ref. Range 08/03/2018  11:55  Delivery systems Unknown NASAL CANNULA  FIO2 Unknown 32.00  pH, Arterial Latest Ref Range: 7.350 - 7.450  7.390  pCO2 arterial Latest Ref Range: 32.0 - 48.0 mmHg 71.2 (HH)  pO2, Arterial Latest Ref Range: 83.0 - 108.0 mmHg 73.9 (L)  Acid-Base Excess Latest Ref Range: 0.0 - 2.0 mmol/L 16.4 (H)  Bicarbonate Latest Ref Range: 20.0 - 28.0 mmol/L 42.3 (H)  O2 Saturation Latest Units: % 95.3  Patient temperature Unknown 98.1  Collection site Unknown LEFT RADIAL  Allens test (pass/fail) Latest Ref Range: PASS  PASS    CBC Latest Ref Rng & Units 07/30/2018 07/13/2018 05/27/2018  WBC 4.0 - 10.5 K/uL 7.9 11.1(H) 9.5  Hemoglobin 13.0 - 17.0 g/dL 12.9(L) 13.9 13.6  Hematocrit 39.0 - 52.0 % 41.7 41.1 43.6  Platelets 150 - 400 K/uL 260 324 298    BMP Latest Ref Rng & Units 08/03/2018 08/02/2018 07/30/2018  Glucose 70 - 99 mg/dL 289(H) 165(H) 144(H)  BUN 8 - 23 mg/dL - 8 7(L)  Creatinine 0.61 - 1.24 mg/dL - 0.58(L) 0.67  BUN/Creat Ratio 10 - 24 - - -  Sodium 135 - 145 mmol/L - 139 136  Potassium 3.5 - 5.1 mmol/L - 3.6 3.6  Chloride 98 - 111 mmol/L - 92(L) 99  CO2 22 - 32 mmol/L - 43(H) 29  Calcium 8.9 - 10.3 mg/dL - 8.6(L) 8.7(L)    BNP    Component Value Date/Time   BNP 208.3 (H) 05/19/2017 1017    ProBNP No results found for: PROBNP  PFT    Component Value Date/Time   FEV1PRE 1.05 08/09/2018 1245   FEV1POST 1.27 08/09/2018 1245   FVCPRE 1.77 08/09/2018 1245   FVCPOST 2.21 08/09/2018 1245   TLC 5.23 08/09/2018 1245   DLCOUNC 14.03 08/09/2018 1245   PREFEV1FVCRT 59 08/09/2018 1245   PSTFEV1FVCRT 57 08/09/2018 1245    Dg Chest Port 1 View  Result Date: 07/31/2018 CLINICAL DATA:  AICD placement EXAM: PORTABLE CHEST 1 VIEW COMPARISON:  11/06/2015 FINDINGS: Left subclavian AICD device has been removed. Right subclavian AICD device has been placed. Tips of the leads project over the right ventricle apex and coronary sinus. The heart is moderately enlarged. Vascular congestion  without interstitial edema. No pneumothorax. IMPRESSION: Right subclavian AICD device placed without pneumothorax. Electronically Signed   By: Marybelle Killings M.D.   On: 07/31/2018 12:19     Past medical hx Past Medical History:  Diagnosis Date  . Alcohol abuse   . Arthritis   . CHF (congestive heart failure) ([place]) 03/12/2017  . Chronic atrial fibrillation 03/12/2017  . COPD (chronic obstructive pulmonary disease) ([place]) 03/12/2017  . Dyspnea    wears Oxygen 24/7 3 L  . Essential hypertension 03/12/2017  . Hyperlipidemia 03/12/2017  . Ischemic cardiomyopathy 03/12/2017  . Long term (current) use of anticoagulants 03/12/2017  . Peripheral vascular disease ([place]) 03/12/2017  . 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  Type 2 diabetes mellitus without complications ([place]) 0/86/7619     Social History   Tobacco Use  . Smoking status: Current Every Day Smoker    Packs/day: 1.00    Years: 61.00    Pack years: 61.00    Types: Cigarettes  . Smokeless tobacco: Never Used  Substance Use Topics  . Alcohol use: No  . Drug use: No    Mr.Strohm reports that he has been smoking cigarettes. He has a 61.00 pack-year smoking history. He has never used smokeless tobacco. He reports that he does not drink alcohol or use drugs.  Tobacco Cessation: Current every day smoker with a 61 pack year smoking history  I have spent 3 minutes counseling patient on smoking cessation this visit. Patient verbalizes understanding of their  choice to continue smoking and the negative health consequences including worsening of COPD, risk of lung cancer , stroke and heart disease. We also discussed the risks of smoking while wearing oxygen to include clothing and hair catching fire, severe burns of the face and internal airways. We discussed that as the oxygen fuels the fire, they spread very quickly and can cause death.He verbalized  understanding.  Past surgical hx, Family hx, Social hx all reviewed.  Current Outpatient Medications on File Prior to Visit  Medication Sig  . diphenhydrAMINE (BENADRYL) 25 MG tablet Take 25 mg by mouth every 6 (six) hours as needed for allergies.   Marland Kitchen enalapril (VASOTEC) 5 MG tablet Take 1 tablet (5 mg total) by mouth daily.  . furosemide (LASIX) 80 MG tablet Take 1.5 tablets (120 mg) in the morning, and 1 tablet (80mg ) in the evening (Patient taking differently: Take 80-120 mg by mouth See admin instructions. Take 1.5 tablets (120 mg) in the morning, and 1 tablet (80mg ) in the evening)  . ipratropium (ATROVENT) 0.02 % nebulizer solution Take 0.5 mg by nebulization every 6 (six) hours as needed for wheezing or shortness of breath.  . metFORMIN (GLUCOPHAGE-XR) 500 MG 24 hr tablet Take 1,000 mg by mouth 2 (two) times daily.   . metoprolol succinate (TOPROL-XL) 25 MG 24 hr tablet Take 1 tablet (25 mg total) by mouth daily. Take with or immediately following a meal.  . potassium chloride SA (K-DUR,KLOR-CON) 20 MEQ tablet Take 20 mEq by mouth every evening.   . simvastatin (ZOCOR) 20 MG tablet Take 20 mg by mouth every evening.   . umeclidinium-vilanterol (ANORO ELLIPTA) 62.5-25 MCG/INH AEPB Inhale 1 puff into the lungs daily.  Marland Kitchen warfarin (COUMADIN) 1 MG tablet Take 1 mg by mouth every Monday, Wednesday, and Friday at 8 PM. Take 1 tablet (1mg ) with 5 mg tablet on Mondays, Wednesdays, & Fridays  . warfarin (COUMADIN) 5 MG tablet Take 5 mg by mouth daily at 8 pm.   No current facility-administered medications on file prior to visit.      Allergies  Allergen Reactions  . Ivp Dye [Iodinated Diagnostic Agents] Other (See Comments)    Constipation x 7 days    Review Of Systems:  Constitutional:   No  weight loss, night sweats,  Fevers, chills, fatigue, or  lassitude.  HEENT:   No headaches,  Difficulty swallowing,  Tooth/dental problems, or  Sore throat,                No sneezing, itching, ear  ache, nasal congestion, post nasal drip,   CV:  No chest pain,  Orthopnea, PND, swelling in lower extremities, anasarca, dizziness, palpitations, syncope.   GI  No  heartburn, indigestion, abdominal pain, nausea, vomiting, diarrhea, change in bowel habits, loss of appetite, bloody stools.   Resp: + shortness of breath with exertion less  at rest.  No excess mucus, no productive cough,  No non-productive cough,  No coughing up of blood.  No change in color of mucus.  No wheezing.  No chest wall deformity  Skin: no rash or lesions.  GU: no dysuria, change in color of urine, no urgency or frequency.  No flank pain, no hematuria   MS:  No joint pain or swelling.  No decreased range of motion.  No back pain.  Psych:  No change in mood or affect. No depression or anxiety.  No memory loss.   Vital Signs BP 110/62 (BP Location: Left Arm, Cuff Size: Large)   Pulse 80   Ht 5\' 7"  (1.702 m)   Wt 232 lb 12.8 oz (105.6 kg)   SpO2 93%   BMI 36.46 kg/m    Physical Exam:  General- No distress,  A&Ox3, pleasant ENT: No sinus tenderness, TM clear, pale nasal mucosa, no oral exudate,no post nasal drip, no LAN Cardiac: S1, S2, regular rate and rhythm, no murmur Chest: No wheeze/ rales/ dullness; no accessory muscle use, no nasal flaring, no sternal retractions, barrel chested, diminished per bases. Abd.: Soft Non-tender, ND, BS +, Obese Ext: No clubbing cyanosis, edema, no obvious deformities Neuro:  normal strength, cranial nerves intact, MAE x 4, appropriate Skin: No rashes, warm and dry Psych: normal mood and behavior, pleasant  Overdose risk score is 260/999 0 Additional risk factors  Assessment/Plan  Chronic respiratory failure (HCC) Hypoxemia/ Hypercarbia Plan: Refuses BiPAP machine despite risks hypercarbia being explained Continue wearing nocturnal oxygen for saturation goal of 88-92% PFT's Sleep Study   COPD (chronic obstructive pulmonary disease) (HCC) Currently does not use  maintenance inhalers No PFT's on record Plan We will order Pulmonary Function tests. On the day of the test, do not use you inhaler until after the test is complete. We will schedule a home sleep test. We will call you with the results. We will give you a sample of Anoro. Use daily, one puff . Rinse mouth after use. Wear your oxygen to maintain oxygen saturation 88-92%. I recommend hat you get an oxygen saturation finger probe. These can be bought on [employer], [place], or [employer]. Wear your oxygen at bedtime as ordered. Follow up after PFT's>>  Please schedule on the day her returns the sleep machine. Referral to Lung Cancer Screening program in 12/2018. Note your daily symptoms > remember "red flags" for COPD:  Increase in cough, increase in sputum production, increase in shortness of breath or activity intolerance. If you notice these symptoms, please call to be seen.   Please contact office for sooner follow up if symptoms do not improve or worsen or seek emergency care     OSA (obstructive sleep apnea) Suspect COPD OSA overlap Syndrome vs OHS Pt. Does not wear BiPAP or CPAP Plan We will order Pulmonary Function tests. On the day of the test, do not use you inhaler until after the test is complete. We will schedule a home sleep test. We will call you with the results. We will give you a sample of Anoro. Use daily, one puff . Rinse mouth after use. Wear your oxygen to maintain oxygen saturation 88-92%. I recommend hat you get an oxygen saturation finger probe. These can be bought on [employer], [place], or [employer]. Wear your oxygen at bedtime as ordered. Follow up  after PFT's>>  Please schedule on the day her returns the sleep machine. Please contact office for sooner follow up if symptoms do not improve or worsen or seek emergency care     Tobacco abuse 61 pack year smoking history Current every day smoker High Risk  patient Plan Referral to [place] he would like to wait until May or June 2020. Do not smoke while wearing oxygen Cigarette smoker   We will refer you today to her lung cancer screening program >>>This is based off of your 61  pack-year smoking history >>> This is a recommendation from the [place] preventative services task force (USPSTF) >>>The USPSTF recommends annual screening for lung cancer with low-dose computed tomography (LDCT) in adults aged 54 to 80 years who have a 30 pack-year smoking history and currently smoke or have quit within the past 15 years. Screening should be discontinued once a person has not smoked for 15 years or develops a health problem that substantially limits life expectancy or the ability or willingness to have curative lung surgery.    Our office will call you and set up an appointment with Eric Form (Nurse Practitioner) who leads this program.  This appointment takes place in our office.   After completing this meeting with Eric Form NP you will get a low-dose CT as the screening >>>We will call you with those results     We recommend that you stop smoking.  >>>You need to set a quit date >>>If you have friends or family who smoke, let them know you are trying to quit and not to smoke around you or in your living environment   Smoking Cessation Resources:  1 800 QUIT NOW  >>> Patient to call this resource and utilize it to help support her quit smoking >>> Keep up your hard work with stopping smoking   You can also contact the [facility] >>>For smoking cessation classes call [phone]   We do not recommend using e-cigarettes as a form of stopping smoking   You can sign up for smoking cessation support texts and information:  >>>https://smokefree.gov/smokefreetxt   Nicotine patches: >>>Make sure you rotate sites that you do not get skin irritation, Apply 1 patch each morning to a non-hairy skin site   If you are smoking greater than 10 cigarettes/day  and weigh over 45 kg start with the nicotine patch of 21 mg a day for 6 weeks, then 14 mg a day for 2 weeks, then finished with 7 mg a day for 2 weeks, then stop   If you are smoking less than 10 cigarettes a day or weight less than 45 kg start with medium dose pack of 14 mg a day for 6 weeks, followed by 7 mg a day for 2 weeks    >>>If insomnia occurs you are having trouble sleeping you can take the patch off at night, and place a new one on in the morning >>>If the patch is removed at night and you have morning cravings start short acting nicotine replacement therapy such as gum or lozenges   Nicotine Gum:  >>>Smokers who smoke 25 or more cigarettes a day should use 4 mg dose >>>Smokers who smoke fewer than 25 cigarettes a day should use 2 mg dose   Proper chewing of gum is important for optimal results.   >>>Do not chew gum to rapidly.  Once you start chewing eating tasty peppery taste and slide the gum to your  cheek.  When the taste disappears to a few more times.  Repeat this for 30 minutes.  Then discard the gum.   >>>Avoid acidic beverages such as coffee, carbonated beverages before and during gum use.  A soft acidic beverages lower oral pH which cause nicotine to not be absorbed properly >>>If you chew the gum too quickly or vigorously you could have nausea, vomiting, abdominal pain, constipation, hiccups, headache, sore jaw, mouth irritation ulcers   Nicotine lozenge: Lozenges are commonly uses short acting NRT product   >>>Smokers who smoke within 30 minutes of awakening should use 4 mg dose >>>Smokers who wait more than 30 minutes after awakening to smoke should use 2 mg dose   Can use up to 1 lozenge every 1-2 hours for 6 weeks >>>Total amount of lozenges that can be used per day as 20 >>>Gradually reduce number of lozenges used per day after 2 weeks of use   Place lozenge in mouth and allowed to dissolve for 30 minutes loss and does not need to be chewed   Lozenges have  advantages to be able to be used in people with TMG, poor dentition, dentures    I have spent 4 minutes counseling patient on smoking cessation this visit. Patient verbalizes understanding of their  choice to continue smoking and the negative health consequences including worsening of COPD, risk of lung cancer , stroke and heart disease.  I have reviewed the risks of smoking while wearing oxygen      Magdalen Spatz, NP 08/09/2018  3:25 PM

## 2018-08-09 NOTE — Assessment & Plan Note (Signed)
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 Does not wear BiPAP or CPAP Plan We will order Pulmonary Function tests. On the day of the test, do not use you inhaler until after the test is complete. We will schedule a home sleep test. We will call you with the results. We will give you a sample of Anoro. Use daily, one puff . Rinse mouth after use. Wear your oxygen to maintain oxygen saturation 88-92%. I recommend hat you get an oxygen saturation finger probe. These can be bought on [employer], [place], or [employer]. Wear your oxygen at bedtime as ordered. Follow up after PFT's>>  Please schedule on the day her returns the sleep machine. Please contact office for sooner follow up if symptoms do not improve or worsen or seek emergency care

## 2018-08-09 NOTE — Assessment & Plan Note (Addendum)
Hypoxemia/ Hypercarbia Plan: Refuses BiPAP machine despite risks hypercarbia being explained Continue wearing nocturnal oxygen for saturation goal of 88-92% PFT's Sleep Study

## 2018-08-09 NOTE — Patient Instructions (Addendum)
It is good to see you today. We will order Pulmonary Function tests. On the day of the test, do not use you inhaler until after the test is complete. We will schedule a home sleep test. We will call you with the results. We will give you a sample of Anoro. Use daily, one puff . Rinse mouth after use. Wear your oxygen to maintain oxygen saturation 88-92%. I recommend hat you get an oxygen saturation finger probe. These can be bought on [employer], [place], or [employer]. Wear your oxygen at bedtime as ordered. Follow up after PFT's>>  Please schedule on the day her returns the sleep machine. Referral to Lung Cancer Screening program in 12/2018. Note your daily symptoms > remember "red flags" for COPD:  Increase in cough, increase in sputum production, increase in shortness of breath or activity intolerance. If you notice these symptoms, please call to be seen.   Please contact office for sooner follow up if symptoms do not improve or worsen or seek emergency care

## 2018-08-10 DIAGNOSIS — G4733 Obstructive sleep apnea (adult) (pediatric): Secondary | ICD-10-CM | POA: Diagnosis not present

## 2018-08-11 ENCOUNTER — Other Ambulatory Visit: Payer: Self-pay | Admitting: *Deleted

## 2018-08-11 ENCOUNTER — Telehealth: Payer: Self-pay | Admitting: Pulmonary Disease

## 2018-08-11 DIAGNOSIS — F17200 Nicotine dependence, unspecified, uncomplicated: Secondary | ICD-10-CM

## 2018-08-11 DIAGNOSIS — G4733 Obstructive sleep apnea (adult) (pediatric): Secondary | ICD-10-CM

## 2018-08-12 ENCOUNTER — Encounter (INDEPENDENT_AMBULATORY_CARE_PROVIDER_SITE_OTHER): Payer: Self-pay

## 2018-08-12 ENCOUNTER — Ambulatory Visit (INDEPENDENT_AMBULATORY_CARE_PROVIDER_SITE_OTHER): Payer: Medicare Other | Admitting: Nurse Practitioner

## 2018-08-12 DIAGNOSIS — I5022 Chronic systolic (congestive) heart failure: Secondary | ICD-10-CM | POA: Diagnosis not present

## 2018-08-12 LAB — CUP PACEART INCLINIC DEVICE CHECK
Implantable Lead Implant Date: 20200103
Implantable Lead Implant Date: 20200103
Implantable Lead Model: 6935
Implantable Pulse Generator Implant Date: 20200103
MDC IDC LEAD LOCATION: 753860
MDC IDC LEAD LOCATION: 753862
MDC IDC SESS DTM: 20200116105429

## 2018-08-12 NOTE — Progress Notes (Signed)
Wound check appointment. Steri-strips removed. Wound without redness or edema. Incision edges approximated, wound well healed. Normal device function. Thresholds, sensing, and impedances consistent with implant measurements. Device programmed at 3.5V for extra safety margin until 3 month visit. Histogram distribution appropriate for patient and level of activity. 1 NSVT episode. Base rate decreased to 70bpm, rate response turned on. Patient educated about wound care, arm mobility, lifting restrictions, shock plan. ROV in 3 months with implanting physician.

## 2018-08-12 NOTE — Telephone Encounter (Signed)
Dr. Ander Slade has reviewed the home sleep test this showed Severe sleep apnea.   Recommendations  CPAP titration in lap.   Weight loss measures .   Advise against driving while sleepy & against medication with sedative side effects.    Make appointment for 3 months for compliance with download with Dr. Ander Slade as a consult.  I have talk with  The patient he has agreed to a titration study. I have placed this order and will schedule patient as a consult with AO after study.

## 2018-08-26 ENCOUNTER — Ambulatory Visit (INDEPENDENT_AMBULATORY_CARE_PROVIDER_SITE_OTHER): Payer: Medicare Other | Admitting: Nurse Practitioner

## 2018-08-26 DIAGNOSIS — I5022 Chronic systolic (congestive) heart failure: Secondary | ICD-10-CM | POA: Diagnosis not present

## 2018-08-26 LAB — CUP PACEART INCLINIC DEVICE CHECK
Date Time Interrogation Session: 20200130095656
Implantable Lead Implant Date: 20200103
Implantable Lead Implant Date: 20200103
Implantable Lead Location: 753862
Implantable Lead Model: 4598
Implantable Pulse Generator Implant Date: 20200103
MDC IDC LEAD LOCATION: 753860

## 2018-08-26 NOTE — Progress Notes (Signed)
Device check in clinic. Base rate decreased to 60 per protocol

## 2018-08-26 NOTE — Patient Instructions (Signed)
Medication Instructions:  none If you need a refill on your cardiac medications before your next appointment, please call your pharmacy.   Lab work: none If you have labs (blood work) drawn today and your tests are completely normal, you will receive your results only by: . MyChart Message (if you have MyChart) OR . A paper copy in the mail If you have any lab test that is abnormal or we need to change your treatment, we will call you to review the results.  Testing/Procedures: none  Follow-Up: At [facility], you and your health needs are our priority.  As part of our continuing mission to provide you with exceptional heart care, we have created designated Provider Care Teams.  These Care Teams include your primary Cardiologist (physician) and Advanced Practice Providers (APPs -  Physician Assistants and Nurse Practitioners) who all work together to provide you with the care you need, when you need it. .   Any Other Special Instructions Will Be Listed Below (If Applicable).    

## 2018-09-02 ENCOUNTER — Telehealth: Payer: Self-pay | Admitting: Pulmonary Disease

## 2018-09-02 DIAGNOSIS — G4733 Obstructive sleep apnea (adult) (pediatric): Secondary | ICD-10-CM | POA: Diagnosis not present

## 2018-09-02 DIAGNOSIS — E7800 Pure hypercholesterolemia, unspecified: Secondary | ICD-10-CM | POA: Diagnosis not present

## 2018-09-02 DIAGNOSIS — J449 Chronic obstructive pulmonary disease, unspecified: Secondary | ICD-10-CM | POA: Diagnosis not present

## 2018-09-02 DIAGNOSIS — I4891 Unspecified atrial fibrillation: Secondary | ICD-10-CM | POA: Diagnosis not present

## 2018-09-02 DIAGNOSIS — Z6837 Body mass index (BMI) 37.0-37.9, adult: Secondary | ICD-10-CM | POA: Diagnosis not present

## 2018-09-02 DIAGNOSIS — E669 Obesity, unspecified: Secondary | ICD-10-CM | POA: Diagnosis not present

## 2018-09-02 DIAGNOSIS — I10 Essential (primary) hypertension: Secondary | ICD-10-CM | POA: Diagnosis not present

## 2018-09-02 DIAGNOSIS — F17200 Nicotine dependence, unspecified, uncomplicated: Secondary | ICD-10-CM | POA: Diagnosis not present

## 2018-09-02 DIAGNOSIS — E1169 Type 2 diabetes mellitus with other specified complication: Secondary | ICD-10-CM | POA: Diagnosis not present

## 2018-09-02 NOTE — Telephone Encounter (Signed)
[facility]. Received recording that office is currently closed for lunch from 1:15-2:15. Will call back.

## 2018-09-03 NOTE — Telephone Encounter (Signed)
Called [facility] and spoke with [place]. She stated that Sissy had just walked out and she would placed me on hold. Placed on hold for over 5 minutes. Will call back.

## 2018-09-06 NOTE — Telephone Encounter (Signed)
[place].  Left detailed message to call back to confirm fax number and info needed.

## 2018-09-07 NOTE — Telephone Encounter (Signed)
Called and spoke with Jaime Scott, she stated that when receiving the fax from our office their computers messed up and they didn't get the whole OV note. She would like for [place] to fax it again. Fax sent. Verified fax number, nothing further needed.

## 2018-09-18 ENCOUNTER — Encounter (HOSPITAL_BASED_OUTPATIENT_CLINIC_OR_DEPARTMENT_OTHER): Payer: Medicare Other

## 2018-09-30 DIAGNOSIS — Z7901 Long term (current) use of anticoagulants: Secondary | ICD-10-CM | POA: Diagnosis not present

## 2018-09-30 DIAGNOSIS — I4891 Unspecified atrial fibrillation: Secondary | ICD-10-CM | POA: Diagnosis not present

## 2018-10-22 ENCOUNTER — Encounter: Payer: Self-pay | Admitting: Internal Medicine

## 2018-10-28 ENCOUNTER — Telehealth: Payer: Self-pay | Admitting: Internal Medicine

## 2018-10-28 NOTE — Telephone Encounter (Signed)
New message   Patient wants to know if appt can be moved out. He said he has not left his house and does not want to leave his house. I informed patient that there is not patients in the office because they are all being changed to virtual except the important ones and he was important. Pt does not want to come into office for this visit.

## 2018-11-02 ENCOUNTER — Telehealth: Payer: Self-pay | Admitting: Internal Medicine

## 2018-11-02 NOTE — Telephone Encounter (Signed)
Follow up:   Patient returning call concerning appt 04/08/.please. call patient.

## 2018-11-02 NOTE — Telephone Encounter (Signed)
[place] to call.

## 2018-11-02 NOTE — Telephone Encounter (Signed)
He can be a phone visit.  And then see him as sooon as can safely bring people to office.  Will leave him on the schedule.

## 2018-11-03 ENCOUNTER — Telehealth: Payer: Self-pay | Admitting: Internal Medicine

## 2018-11-03 ENCOUNTER — Telehealth: Payer: Medicare Other | Admitting: Internal Medicine

## 2018-11-03 ENCOUNTER — Other Ambulatory Visit: Payer: Self-pay

## 2018-11-03 NOTE — Progress Notes (Deleted)
Electrophysiology TeleHealth Note   Due to national recommendations of social distancing due to COVID 19, an audio/video telehealth visit is felt to be most appropriate for this patient at this time.  See MyChart message from today for the patient's consent to telehealth for [facility].   Date:  11/03/2018   ID:  Jaime Scott, DOB Dec 22, 1950, MRN [id number]  Location: patient's home  Provider location: [address] [place]  Evaluation Performed: Follow-up visit  PCP:  Greig Right, MD  Cardiologist:  No primary care provider on file.  Electrophysiologist:  DrTaylor  Chief Complaint:  ***  History of Present Illness:    Jaime Scott is a 68 y.o. male who presents via audio/video conferencing for a telehealth visit today.  Since last being seen in our clinic, the patient reports doing very well.  Today, he denies symptoms of palpitations, chest pain, shortness of breath,  lower extremity edema, dizziness, presyncope, or syncope.  The patient is otherwise without complaint today.  The patient denies symptoms of fevers, chills, cough, or new SOB worrisome for COVID 19.  Past Medical History:  Diagnosis Date  . Alcohol abuse   . Arthritis   . CHF (congestive heart failure) ([place]) 03/12/2017  . Chronic atrial fibrillation 03/12/2017  . COPD (chronic obstructive pulmonary disease) ([place]) 03/12/2017  . Dyspnea    wears Oxygen 24/7 3 L  . Essential hypertension 03/12/2017  . Hyperlipidemia 03/12/2017  . Ischemic cardiomyopathy 03/12/2017  . Long term (current) use of anticoagulants 03/12/2017  . Peripheral vascular disease ([place]) 03/12/2017  . 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 Type 2 diabetes mellitus without complications ([place]) 5/88/5027    Past Surgical History:  Procedure Laterality Date  . AV NODE ABLATION N/A 08/02/2018   Procedure: AV NODE ABLATION;  Surgeon: Evans Lance, MD;  Location: [place] [facility];  Service: Cardiovascular;  Laterality: N/A;  . BIV ICD INSERTION CRT-D N/A 07/30/2018   Procedure: BIV ICD INSERTION CRT-D;  Surgeon: Evans Lance, MD;  Location: [place];  Service: Cardiovascular;  Laterality: N/A;  . [place] N/A 05/27/2018   Procedure: BIV PACEMAKER GENERATOR UPGRADE - Aborted;  Surgeon: Constance Haw, MD;  Location: [place] [facility];  Service: Cardiovascular;  Laterality: N/A;  . CHOLECYSTECTOMY    . COLONOSCOPY    . ERCP     stone removal  . EXCISION OF BREAST BIOPSY     Age 54, benign  . PACEMAKER LEAD REMOVAL N/A 07/30/2018   Procedure: PACEMAKER LEAD REMOVAL;  Surgeon: Evans Lance, MD;  Location: [facility] OR;  Service: Cardiovascular;  Laterality: N/A;    Current Outpatient Medications  Medication Sig Dispense Refill  . diphenhydrAMINE (BENADRYL) 25 MG tablet Take 25 mg by mouth every 6 (six) hours as needed for allergies.     Marland Kitchen enalapril (VASOTEC) 5 MG tablet Take 1 tablet (5 mg total) by mouth daily. 180 tablet 3  . furosemide (LASIX) 80 MG tablet Take 1.5 tablets (120 mg) in the morning, and 1 tablet (80mg ) in the evening (Patient taking differently: Take 80-120 mg by mouth See admin instructions. Take 1.5 tablets (120 mg) in the morning, and 1 tablet (80mg ) in the evening) 75 tablet 5  . ipratropium (ATROVENT) 0.02 % nebulizer solution Take 0.5 mg by nebulization every 6 (six) hours as needed for wheezing or shortness of breath.    Marland Kitchen  metFORMIN (GLUCOPHAGE-XR) 500 MG 24 hr tablet Take 1,000 mg by mouth 2 (two) times daily.     . metoprolol succinate (TOPROL-XL) 25 MG 24 hr tablet Take 1 tablet (25 mg total) by mouth daily. Take with or immediately following a meal. 30 tablet 6  . potassium chloride SA (K-DUR,KLOR-CON) 20 MEQ tablet Take 20 mEq by mouth every evening.     . simvastatin (ZOCOR) 20 MG tablet Take 20 mg by mouth every evening.     . umeclidinium-vilanterol (ANORO ELLIPTA) 62.5-25  MCG/INH AEPB Inhale 1 puff into the lungs daily. 1 each 0  . warfarin (COUMADIN) 1 MG tablet Take 1 mg by mouth every Monday, Wednesday, and Friday at 8 PM. Take 1 tablet (1mg ) with 5 mg tablet on Mondays, Wednesdays, & Fridays    . warfarin (COUMADIN) 5 MG tablet Take 5 mg by mouth daily at 8 pm.     No current facility-administered medications for this visit.     Allergies:   Ivp dye [iodinated diagnostic agents]   Social History:  The patient  reports that he has been smoking cigarettes. He has a 61.00 pack-year smoking history. He has never used smokeless tobacco. He reports that he does not drink alcohol or use drugs.   Family History:  The patient's *** family history includes Alcohol abuse in his brother and brother; Breast cancer in his sister.   ROS:  Please see the history of present illness.   All other systems are personally reviewed and negative.    Exam:    Vital Signs:  There were no vitals taken for this visit.  Well appearing, alert and conversant, regular work of breathing,  good skin color Eyes- anicteric, neuro- grossly intact, skin- no apparent rash or lesions or cyanosis, mouth- oral mucosa is pink   Labs/Other Tests and Data Reviewed:    Recent Labs: 07/30/2018: Hemoglobin 12.9; Platelets 260 08/02/2018: BUN 8; Creatinine, Ser 0.58; Potassium 3.6; Sodium 139   Wt Readings from Last 3 Encounters:  08/09/18 232 lb 12.8 oz (105.6 kg)  08/03/18 245 lb 12.8 oz (111.5 kg)  07/13/18 230 lb (104.3 kg)     Other studies personally reviewed: Additional studies/ records that were reviewed today include: ***  Review of the above records today demonstrates: as above Prior radiographs: ***  The patient presents wearable device technology report for my review today. On my review, the patient presents with Apple Watch tracings from *** . The tracings reveal ***  Last device remote is reviewed from [place] PDF dated *** which reveals normal device function, no  arrhythmias ***   ASSESSMENT & PLAN:    1.  ***   COVID 19 screen The patient denies symptoms of COVID 19 at this time.  The importance of social distancing was discussed today.  Follow-up:  *** Next remote: ***  Current medicines are reviewed at length with the patient today.   The patient {ACTIONS; HAS/DOES NOT HAVE:19233} concerns regarding his medicines.  The following changes were made today:  {NONE DEFAULTED:18576::"none"}  Labs/ tests ordered today include: *** No orders of the defined types were placed in this encounter.    Patient Risk:  after full review of this patients clinical status, I feel that they are at moderate risk at this time.  Today, I have spent *** minutes with the patient with telehealth technology discussing *** .    Signed, Cristopher Peru, MD  11/03/2018 1:52 PM     [address] [place] [address] [phone] (office) [phone] (fax)

## 2018-11-03 NOTE — Telephone Encounter (Signed)
Patient calling back stating his call got dropped during his appt

## 2018-11-08 NOTE — Telephone Encounter (Signed)
Called and spoke w/ pt and got his physical address. Informed him that he should expect new monitor in 7-10 business days.

## 2018-11-08 NOTE — Telephone Encounter (Signed)
-----   Message from Patsey Berthold, NP sent at 11/05/2018 12:31 PM EDT ----- We need to order him a new monitor (can be done through [facility]) -> when I tried to call him back to get a physical address, I didn't get an answer. Can you try him again on Monday?  Thank you!

## 2018-11-09 ENCOUNTER — Other Ambulatory Visit: Payer: Self-pay

## 2018-11-09 ENCOUNTER — Telehealth (INDEPENDENT_AMBULATORY_CARE_PROVIDER_SITE_OTHER): Payer: Medicare Other | Admitting: Internal Medicine

## 2018-11-09 DIAGNOSIS — I5022 Chronic systolic (congestive) heart failure: Secondary | ICD-10-CM | POA: Diagnosis not present

## 2018-11-09 DIAGNOSIS — Z95810 Presence of automatic (implantable) cardiac defibrillator: Secondary | ICD-10-CM

## 2018-11-09 NOTE — Progress Notes (Signed)
Electrophysiology TeleHealth Note   Due to national recommendations of social distancing due to COVID 19, an audio/video telehealth visit is felt to be most appropriate for this patient at this time.  See MyChart message from today for the patient's consent to telehealth for [facility].   Date:  11/09/2018   ID:  Jaime Scott, DOB September 25, 1950, MRN [id number]  Location: patient's home  Provider location: [address], [place]  Evaluation Performed: Follow-up visit  PCP:  Greig Right, MD  Cardiologist:  No primary care provider on file.  Electrophysiologist:  Dr Lovena Le  Chief Complaint:  "I'm feeling pretty good."  History of Present Illness:    Jaime Scott is a 68 y.o. male who presents via audio/video conferencing for a telehealth visit today. He is a 68 yo man with persistent atrial fib and tachybrady s/p PPM insertion who developed worsening CHF symptoms associated with uncontrolled atrial fib and underwent AV node ablation. He is still smoking but since last being seen in our clinic, the patient reports doing very well.  Today, he denies symptoms of palpitations, chest pain, shortness of breath,  lower extremity edema, dizziness, presyncope, or syncope.  The patient is otherwise without complaint today.  The patient denies symptoms of fevers, chills, cough, or new SOB worrisome for COVID 19.  Past Medical History:  Diagnosis Date  . Alcohol abuse   . Arthritis   . CHF (congestive heart failure) ([place]) 03/12/2017  . Chronic atrial fibrillation 03/12/2017  . COPD (chronic obstructive pulmonary disease) ([place]) 03/12/2017  . Dyspnea    wears Oxygen 24/7 3 L  . Essential hypertension 03/12/2017  . Hyperlipidemia 03/12/2017  . Ischemic cardiomyopathy 03/12/2017  . Long term (current) use of anticoagulants 03/12/2017  . Peripheral vascular disease ([place]) 03/12/2017  . 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 Type 2 diabetes mellitus without complications ([place]) 9/48/5462    Past Surgical History:  Procedure Laterality Date  . AV NODE ABLATION N/A 08/02/2018   Procedure: AV NODE ABLATION;  Surgeon: Evans Lance, MD;  Location: [place] [facility];  Service: Cardiovascular;  Laterality: N/A;  . BIV ICD INSERTION CRT-D N/A 07/30/2018   Procedure: BIV ICD INSERTION CRT-D;  Surgeon: Evans Lance, MD;  Location: [place];  Service: Cardiovascular;  Laterality: N/A;  . [place] N/A 05/27/2018   Procedure: BIV PACEMAKER GENERATOR UPGRADE - Aborted;  Surgeon: Constance Haw, MD;  Location: [place] [facility];  Service: Cardiovascular;  Laterality: N/A;  . CHOLECYSTECTOMY    . COLONOSCOPY    . ERCP     stone removal  . EXCISION OF BREAST BIOPSY     Age 30, benign  . PACEMAKER LEAD REMOVAL N/A 07/30/2018   Procedure: PACEMAKER LEAD REMOVAL;  Surgeon: Evans Lance, MD;  [facility];  Service: Cardiovascular;  Laterality: N/A;    Current Outpatient Medications  Medication Sig Dispense Refill  . diphenhydrAMINE (BENADRYL) 25 MG tablet Take 25 mg by mouth every 6 (six) hours as needed for allergies.     Marland Kitchen enalapril (VASOTEC) 5 MG tablet Take 1 tablet (5 mg total) by mouth daily. 180 tablet 3  . furosemide (LASIX) 80 MG tablet Take 1.5 tablets (120 mg) in the morning, and 1 tablet (80mg ) in the evening (Patient taking differently: Take 80-120 mg by mouth See admin instructions. Take 1.5 tablets (120 mg) in  the morning, and 1 tablet (80mg ) in the evening) 75 tablet 5  . ipratropium (ATROVENT) 0.02 % nebulizer solution Take 0.5 mg by nebulization every 6 (six) hours as needed for wheezing or shortness of breath.    . metFORMIN (GLUCOPHAGE-XR) 500 MG 24 hr tablet Take 1,000 mg by mouth 2 (two) times daily.     . metoprolol succinate (TOPROL-XL) 25 MG 24 hr tablet Take 1 tablet (25 mg total) by mouth daily. Take with or immediately following a meal. 30  tablet 6  . potassium chloride SA (K-DUR,KLOR-CON) 20 MEQ tablet Take 20 mEq by mouth every evening.     . simvastatin (ZOCOR) 20 MG tablet Take 20 mg by mouth every evening.     . umeclidinium-vilanterol (ANORO ELLIPTA) 62.5-25 MCG/INH AEPB Inhale 1 puff into the lungs daily. 1 each 0  . warfarin (COUMADIN) 1 MG tablet Take 1 mg by mouth every Monday, Wednesday, and Friday at 8 PM. Take 1 tablet (1mg ) with 5 mg tablet on Mondays, Wednesdays, & Fridays    . warfarin (COUMADIN) 5 MG tablet Take 5 mg by mouth daily at 8 pm.     No current facility-administered medications for this visit.     Allergies:   Ivp dye [iodinated diagnostic agents]   Social History:  The patient  reports that he has been smoking cigarettes. He has a 61.00 pack-year smoking history. He has never used smokeless tobacco. He reports that he does not drink alcohol or use drugs.   Family History:  The patient's  family history includes Alcohol abuse in his brother and brother; Breast cancer in his sister.   ROS:  Please see the history of present illness.   All other systems are personally reviewed and negative.    Exam:    Vital Signs:  There were no vitals taken for this visit.  Well appearing, alert and conversant, regular work of breathing,  good skin color Eyes- anicteric, neuro- grossly intact, skin- no apparent rash or lesions or cyanosis, mouth- oral mucosa is pink   Labs/Other Tests and Data Reviewed:    Recent Labs: 07/30/2018: Hemoglobin 12.9; Platelets 260 08/02/2018: BUN 8; Creatinine, Ser 0.58; Potassium 3.6; Sodium 139   Wt Readings from Last 3 Encounters:  08/09/18 232 lb 12.8 oz (105.6 kg)  08/03/18 245 lb 12.8 oz (111.5 kg)  07/13/18 230 lb (104.3 kg)     Other studies personally reviewed:  Last device remote is reviewed from [place] PDF dated 08/26/18 which reveals normal device function, no arrhythmias    ASSESSMENT & PLAN:    1.  Atrial fib - his VR is well controlled after AV node  ablation.  2. PPM - his device was working normally when last check in January. He will undergo remote monitoring later this month. 3. Chronic systolic heart failure - he is s/p Biv ICD insertion and his symptoms are class 2.  4. COVID 19 screen The patient denies symptoms of COVID 19 at this time.  The importance of social distancing was discussed today.  Follow-up:  3 months Next remote: next 2 weeks  Current medicines are reviewed at length with the patient today.   The patient does not have concerns regarding his medicines.  The following changes were made today:  none  Labs/ tests ordered today include: none No orders of the defined types were placed in this encounter.    Patient Risk:  after full review of this patients clinical status, I feel that they are  at moderate risk at this time.  Today, I have spent 15 minutes with the patient with telehealth technology discussing all of the above.    Signed, Cristopher Peru, MD  11/09/2018 10:50 AM     [facility] [address] [place] [address] [phone] (office) [phone] (fax)

## 2018-11-30 DIAGNOSIS — I4891 Unspecified atrial fibrillation: Secondary | ICD-10-CM | POA: Diagnosis not present

## 2018-12-01 DIAGNOSIS — Z6836 Body mass index (BMI) 36.0-36.9, adult: Secondary | ICD-10-CM | POA: Diagnosis not present

## 2018-12-01 DIAGNOSIS — J449 Chronic obstructive pulmonary disease, unspecified: Secondary | ICD-10-CM | POA: Diagnosis not present

## 2018-12-01 DIAGNOSIS — E669 Obesity, unspecified: Secondary | ICD-10-CM | POA: Diagnosis not present

## 2018-12-01 DIAGNOSIS — I4891 Unspecified atrial fibrillation: Secondary | ICD-10-CM | POA: Diagnosis not present

## 2018-12-01 DIAGNOSIS — E1169 Type 2 diabetes mellitus with other specified complication: Secondary | ICD-10-CM | POA: Diagnosis not present

## 2018-12-01 DIAGNOSIS — E7800 Pure hypercholesterolemia, unspecified: Secondary | ICD-10-CM | POA: Diagnosis not present

## 2018-12-01 DIAGNOSIS — I10 Essential (primary) hypertension: Secondary | ICD-10-CM | POA: Diagnosis not present

## 2018-12-20 LAB — CUP PACEART REMOTE DEVICE CHECK
Battery Remaining Longevity: 62 mo
Battery Voltage: 3.01 V
Brady Statistic AP VP Percent: 0 %
Brady Statistic AP VS Percent: 0 %
Brady Statistic AS VP Percent: 0 %
Brady Statistic AS VS Percent: 0 %
Brady Statistic RA Percent Paced: 0 %
Brady Statistic RV Percent Paced: 99.75 %
Date Time Interrogation Session: 20200524105126
HighPow Impedance: 76 Ohm
Implantable Lead Implant Date: 20200103
Implantable Lead Implant Date: 20200103
Implantable Lead Location: 753858
Implantable Lead Location: 753860
Implantable Lead Model: 4598
Implantable Lead Model: 6935
Implantable Pulse Generator Implant Date: 20200103
Lead Channel Impedance Value: 137.586
Lead Channel Impedance Value: 142.5 Ohm
Lead Channel Impedance Value: 156.471
Lead Channel Impedance Value: 162.857
Lead Channel Impedance Value: 162.857
Lead Channel Impedance Value: 266 Ohm
Lead Channel Impedance Value: 285 Ohm
Lead Channel Impedance Value: 285 Ohm
Lead Channel Impedance Value: 380 Ohm
Lead Channel Impedance Value: 380 Ohm
Lead Channel Impedance Value: 380 Ohm
Lead Channel Impedance Value: 4047 Ohm
Lead Channel Impedance Value: 437 Ohm
Lead Channel Impedance Value: 494 Ohm
Lead Channel Impedance Value: 513 Ohm
Lead Channel Impedance Value: 570 Ohm
Lead Channel Impedance Value: 646 Ohm
Lead Channel Impedance Value: 646 Ohm
Lead Channel Pacing Threshold Amplitude: 0.5 V
Lead Channel Pacing Threshold Amplitude: 1.75 V
Lead Channel Pacing Threshold Pulse Width: 0.4 ms
Lead Channel Pacing Threshold Pulse Width: 1 ms
Lead Channel Sensing Intrinsic Amplitude: 24.375 mV
Lead Channel Sensing Intrinsic Amplitude: 24.375 mV
Lead Channel Setting Pacing Amplitude: 2.5 V
Lead Channel Setting Pacing Amplitude: 2.75 V
Lead Channel Setting Pacing Pulse Width: 0.4 ms
Lead Channel Setting Pacing Pulse Width: 1 ms
Lead Channel Setting Sensing Sensitivity: 0.3 mV

## 2018-12-21 ENCOUNTER — Telehealth: Payer: Self-pay | Admitting: Acute Care

## 2018-12-21 ENCOUNTER — Ambulatory Visit (INDEPENDENT_AMBULATORY_CARE_PROVIDER_SITE_OTHER): Payer: Medicare Other | Admitting: *Deleted

## 2018-12-21 DIAGNOSIS — I5022 Chronic systolic (congestive) heart failure: Secondary | ICD-10-CM

## 2018-12-21 DIAGNOSIS — I428 Other cardiomyopathies: Secondary | ICD-10-CM

## 2018-12-21 NOTE — Telephone Encounter (Signed)
FYI:  I spoke with pt regarding lung cancer screening. Pt states he does not wish to participate at this time. Referral has been cancelled.

## 2018-12-21 NOTE — Telephone Encounter (Signed)
Please send a telephone note to whoever referred him so they know he has refused. Thanks so much Jermyn.

## 2018-12-22 DIAGNOSIS — I4891 Unspecified atrial fibrillation: Secondary | ICD-10-CM | POA: Diagnosis not present

## 2018-12-28 NOTE — Progress Notes (Signed)
Remote ICD transmission.   

## 2019-01-03 DIAGNOSIS — Z7901 Long term (current) use of anticoagulants: Secondary | ICD-10-CM | POA: Diagnosis not present

## 2019-01-03 DIAGNOSIS — I4891 Unspecified atrial fibrillation: Secondary | ICD-10-CM | POA: Diagnosis not present

## 2019-02-07 DIAGNOSIS — Z7901 Long term (current) use of anticoagulants: Secondary | ICD-10-CM | POA: Diagnosis not present

## 2019-02-07 DIAGNOSIS — I4891 Unspecified atrial fibrillation: Secondary | ICD-10-CM | POA: Diagnosis not present

## 2019-02-17 ENCOUNTER — Other Ambulatory Visit: Payer: Self-pay | Admitting: Physician Assistant

## 2019-02-17 ENCOUNTER — Other Ambulatory Visit: Payer: Self-pay | Admitting: Cardiology

## 2019-02-23 ENCOUNTER — Telehealth: Payer: Self-pay | Admitting: Internal Medicine

## 2019-02-23 NOTE — Telephone Encounter (Signed)
Patient informed  Last transmission showed normal device function and there were no new alerts.

## 2019-02-23 NOTE — Telephone Encounter (Signed)
New message:     Patient calling stating he sent in a home remote check on 02/18/19 and have not heard from anyone.

## 2019-03-03 DIAGNOSIS — J449 Chronic obstructive pulmonary disease, unspecified: Secondary | ICD-10-CM | POA: Diagnosis not present

## 2019-03-03 DIAGNOSIS — Z6838 Body mass index (BMI) 38.0-38.9, adult: Secondary | ICD-10-CM | POA: Diagnosis not present

## 2019-03-03 DIAGNOSIS — E1169 Type 2 diabetes mellitus with other specified complication: Secondary | ICD-10-CM | POA: Diagnosis not present

## 2019-03-03 DIAGNOSIS — I10 Essential (primary) hypertension: Secondary | ICD-10-CM | POA: Diagnosis not present

## 2019-03-03 DIAGNOSIS — I4891 Unspecified atrial fibrillation: Secondary | ICD-10-CM | POA: Diagnosis not present

## 2019-03-03 DIAGNOSIS — E669 Obesity, unspecified: Secondary | ICD-10-CM | POA: Diagnosis not present

## 2019-03-03 DIAGNOSIS — E7800 Pure hypercholesterolemia, unspecified: Secondary | ICD-10-CM | POA: Diagnosis not present

## 2019-03-22 ENCOUNTER — Other Ambulatory Visit: Payer: Self-pay | Admitting: Cardiology

## 2019-03-22 DIAGNOSIS — I4891 Unspecified atrial fibrillation: Secondary | ICD-10-CM | POA: Diagnosis not present

## 2019-03-23 ENCOUNTER — Ambulatory Visit (INDEPENDENT_AMBULATORY_CARE_PROVIDER_SITE_OTHER): Payer: Medicare Other | Admitting: *Deleted

## 2019-03-23 DIAGNOSIS — I428 Other cardiomyopathies: Secondary | ICD-10-CM | POA: Diagnosis not present

## 2019-03-23 LAB — CUP PACEART REMOTE DEVICE CHECK
Battery Remaining Longevity: 62 mo
Battery Voltage: 2.99 V
Brady Statistic AP VP Percent: 0 %
Brady Statistic AP VS Percent: 0 %
Brady Statistic AS VP Percent: 0 %
Brady Statistic AS VS Percent: 0 %
Brady Statistic RA Percent Paced: 0 %
Brady Statistic RV Percent Paced: 99.78 %
Date Time Interrogation Session: 20200826062822
HighPow Impedance: 89 Ohm
Implantable Lead Implant Date: 20200103
Implantable Lead Implant Date: 20200103
Implantable Lead Location: 753858
Implantable Lead Location: 753860
Implantable Lead Model: 4598
Implantable Lead Model: 6935
Implantable Pulse Generator Implant Date: 20200103
Lead Channel Impedance Value: 151.406
Lead Channel Impedance Value: 161.5 Ohm
Lead Channel Impedance Value: 166.25 Ohm
Lead Channel Impedance Value: 178.5 Ohm
Lead Channel Impedance Value: 178.5 Ohm
Lead Channel Impedance Value: 285 Ohm
Lead Channel Impedance Value: 323 Ohm
Lead Channel Impedance Value: 323 Ohm
Lead Channel Impedance Value: 380 Ohm
Lead Channel Impedance Value: 399 Ohm
Lead Channel Impedance Value: 4047 Ohm
Lead Channel Impedance Value: 437 Ohm
Lead Channel Impedance Value: 437 Ohm
Lead Channel Impedance Value: 513 Ohm
Lead Channel Impedance Value: 551 Ohm
Lead Channel Impedance Value: 608 Ohm
Lead Channel Impedance Value: 608 Ohm
Lead Channel Impedance Value: 665 Ohm
Lead Channel Pacing Threshold Amplitude: 0.5 V
Lead Channel Pacing Threshold Amplitude: 1.75 V
Lead Channel Pacing Threshold Pulse Width: 0.4 ms
Lead Channel Pacing Threshold Pulse Width: 1 ms
Lead Channel Sensing Intrinsic Amplitude: 18.625 mV
Lead Channel Sensing Intrinsic Amplitude: 18.625 mV
Lead Channel Setting Pacing Amplitude: 2.5 V
Lead Channel Setting Pacing Amplitude: 2.75 V
Lead Channel Setting Pacing Pulse Width: 0.4 ms
Lead Channel Setting Pacing Pulse Width: 1 ms
Lead Channel Setting Sensing Sensitivity: 0.3 mV

## 2019-03-31 ENCOUNTER — Encounter: Payer: Self-pay | Admitting: Cardiology

## 2019-03-31 NOTE — Progress Notes (Signed)
Remote ICD transmission.   

## 2019-04-05 ENCOUNTER — Telehealth: Payer: Self-pay | Admitting: *Deleted

## 2019-04-05 NOTE — Telephone Encounter (Signed)
Spoke with patient regarding manual ICD transmission received on 04/02/19 at 01:54. Pt reports he was "bored" due to insomnia and had ~20sec of chest discomfort, felt like he was being pricked with a needle in the center of his chest. He quit smoking 2 months ago and feels his ShOB is improved. No other issues recently. Requested that pt call us if he is having symptoms and is going to send a transmission. Advised to call back for new or worsening symptoms. Pt verbalizes understanding. Called pt back after reviewing appointments--pt is overdue for f/u with Dr. Lovena Le. Aware that a scheduler will be contacting him. No further questions at this time.

## 2019-04-07 ENCOUNTER — Other Ambulatory Visit: Payer: Self-pay | Admitting: Cardiology

## 2019-04-22 ENCOUNTER — Other Ambulatory Visit: Payer: Self-pay | Admitting: Cardiology

## 2019-04-26 DIAGNOSIS — I4891 Unspecified atrial fibrillation: Secondary | ICD-10-CM | POA: Diagnosis not present

## 2019-04-28 DIAGNOSIS — Z7901 Long term (current) use of anticoagulants: Secondary | ICD-10-CM | POA: Diagnosis not present

## 2019-05-25 DIAGNOSIS — I4891 Unspecified atrial fibrillation: Secondary | ICD-10-CM | POA: Diagnosis not present

## 2019-05-27 DIAGNOSIS — Z7901 Long term (current) use of anticoagulants: Secondary | ICD-10-CM | POA: Diagnosis not present

## 2019-05-31 ENCOUNTER — Other Ambulatory Visit: Payer: Self-pay

## 2019-05-31 ENCOUNTER — Encounter (INDEPENDENT_AMBULATORY_CARE_PROVIDER_SITE_OTHER): Payer: Self-pay

## 2019-05-31 ENCOUNTER — Encounter: Payer: Self-pay | Admitting: Internal Medicine

## 2019-05-31 ENCOUNTER — Ambulatory Visit (INDEPENDENT_AMBULATORY_CARE_PROVIDER_SITE_OTHER): Payer: Medicare Other | Admitting: Internal Medicine

## 2019-05-31 VITALS — BP 110/72 | HR 80 | Ht 67.0 in | Wt 240.0 lb

## 2019-05-31 DIAGNOSIS — Z95810 Presence of automatic (implantable) cardiac defibrillator: Secondary | ICD-10-CM | POA: Diagnosis not present

## 2019-05-31 DIAGNOSIS — I482 Chronic atrial fibrillation, unspecified: Secondary | ICD-10-CM

## 2019-05-31 DIAGNOSIS — I447 Left bundle-branch block, unspecified: Secondary | ICD-10-CM | POA: Diagnosis not present

## 2019-05-31 DIAGNOSIS — I255 Ischemic cardiomyopathy: Secondary | ICD-10-CM

## 2019-05-31 DIAGNOSIS — I5022 Chronic systolic (congestive) heart failure: Secondary | ICD-10-CM | POA: Diagnosis not present

## 2019-05-31 NOTE — Progress Notes (Signed)
HPI He is a 68 yo man with persistent atrial fib and tachybrady s/p PPM insertion who developed worsening CHF symptoms associated with uncontrolled atrial fib and underwent AV node ablation. He has stopped smoking but since last being seen in our clinic, gained over 20 lbs. Today, he denies symptoms of palpitations, chest pain, shortness of breath,  lower extremity edema, dizziness, presyncope, or syncope.   Allergies  Allergen Reactions  . Ivp Dye [Iodinated Diagnostic Agents] Other (See Comments)    Constipation x 7 days     Current Outpatient Medications  Medication Sig Dispense Refill  . diphenhydrAMINE (BENADRYL) 25 MG tablet Take 25 mg by mouth every 6 (six) hours as needed for allergies.     Marland Kitchen enalapril (VASOTEC) 5 MG tablet TAKE ONE TABLET BY MOUTH EVERY DAY 30 tablet 1  . furosemide (LASIX) 80 MG tablet Take 80 mg by mouth 2 (two) times daily. 2 tablets in the morning and 1 tablets in the evening    . ipratropium (ATROVENT) 0.02 % nebulizer solution Take 0.5 mg by nebulization every 6 (six) hours as needed for wheezing or shortness of breath.    . metFORMIN (GLUCOPHAGE-XR) 500 MG 24 hr tablet Take 1,000 mg by mouth 2 (two) times daily.     . metoprolol succinate (TOPROL-XL) 25 MG 24 hr tablet TAKE ONE TABLET BY MOUTH EVERY DAY WITH FOOD 30 tablet 8  . potassium chloride SA (K-DUR,KLOR-CON) 20 MEQ tablet Take 20 mEq by mouth every evening.     . simvastatin (ZOCOR) 20 MG tablet Take 20 mg by mouth every evening.     . umeclidinium-vilanterol (ANORO ELLIPTA) 62.5-25 MCG/INH AEPB Inhale 1 puff into the lungs daily. 1 each 0  . warfarin (COUMADIN) 1 MG tablet Take 1 mg by mouth every Monday, Wednesday, and Friday at 8 PM. Take 1 tablet (1mg ) with 5 mg tablet on Mondays, Wednesdays, & Fridays    . warfarin (COUMADIN) 5 MG tablet Take 5 mg by mouth daily at 8 pm.     No current facility-administered medications for this visit.      Past Medical History:  Diagnosis Date  .  Alcohol abuse   . Arthritis   . CHF (congestive heart failure) ([place]) 03/12/2017  . Chronic atrial fibrillation ([place]) 03/12/2017  . COPD (chronic obstructive pulmonary disease) ([place]) 03/12/2017  . Dyspnea    wears Oxygen 24/7 3 L  . Essential hypertension 03/12/2017  . Hyperlipidemia 03/12/2017  . Ischemic cardiomyopathy 03/12/2017  . Long term (current) use of anticoagulants 03/12/2017  . Peripheral vascular disease ([place]) 03/12/2017  . 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 Type 2 diabetes mellitus without complications ([place]) 123XX123    ROS:   All systems reviewed and negative except as noted in the HPI.   Past Surgical History:  Procedure Laterality Date  . AV NODE ABLATION N/A 08/02/2018   Procedure: AV NODE ABLATION;  Surgeon: Evans Lance, MD;  Location: [facility];  Service: Cardiovascular;  Laterality: N/A;  . BIV ICD INSERTION CRT-D N/A 07/30/2018   Procedure: BIV ICD INSERTION CRT-D;  Surgeon: Evans Lance, MD;  Location: [facility];  Service: Cardiovascular;  Laterality: N/A;  . [place] N/A 05/27/2018   Procedure: BIV PACEMAKER GENERATOR UPGRADE - Aborted;  Surgeon: Constance Haw, MD;  Location: [facility];  Service: Cardiovascular;  Laterality: N/A;  .  CHOLECYSTECTOMY    . COLONOSCOPY    . ERCP     stone removal  . EXCISION OF BREAST BIOPSY     Age 89, benign  . PACEMAKER LEAD REMOVAL N/A 07/30/2018   Procedure: PACEMAKER LEAD REMOVAL;  Surgeon: Evans Lance, MD;  Location: [facility] OR;  Service: Cardiovascular;  Laterality: N/A;     Family History  Problem Relation Age of Onset  . Breast cancer Sister   . Alcohol abuse Brother   . Alcohol abuse Brother      Social History   Socioeconomic History  . Marital status: Divorced    Spouse name: Not on file  . Number of children: Not on file  . Years of education: Not on file  . Highest education  level: Not on file  Occupational History  . Not on file  Social Needs  . Financial resource strain: Not on file  . Food insecurity    Worry: Not on file    Inability: Not on file  . Transportation needs    Medical: Not on file    Non-medical: Not on file  Tobacco Use  . Smoking status: Current Every Day Smoker    Packs/day: 1.00    Years: 61.00    Pack years: 61.00    Types: Cigarettes  . Smokeless tobacco: Never Used  Substance and Sexual Activity  . Alcohol use: No  . Drug use: No  . Sexual activity: Not on file  Lifestyle  . Physical activity    Days per week: Not on file    Minutes per session: Not on file  . Stress: Not on file  Relationships  . Social Herbalist on phone: Not on file    Gets together: Not on file    Attends religious service: Not on file    Active member of club or organization: Not on file    Attends meetings of clubs or organizations: Not on file    Relationship status: Not on file  . Intimate partner violence    Fear of current or ex partner: Not on file    Emotionally abused: Not on file    Physically abused: Not on file    Forced sexual activity: Not on file  Other Topics Concern  . Not on file  Social History Narrative  . Not on file     BP 110/72   Pulse 80   Ht 5\' 7"  (1.702 m)   Wt 240 lb (108.9 kg)   SpO2 97%   BMI 37.59 kg/m   Physical Exam:  Well appearing NAD HEENT: Unremarkable Neck:  No JVD, no thyromegally Lymphatics:  No adenopathy Back:  No CVA tenderness Lungs:  Clear HEART:  Regular rate rhythm, no murmurs, no rubs, no clicks Abd:  soft, positive bowel sounds, no organomegally, no rebound, no guarding Ext:  2 plus pulses, no edema, no cyanosis, no clubbing Skin:  No rashes no nodules Neuro:  CN II through XII intact, motor grossly intact  EKG - nsr with RV only pacing  DEVICE  Normal device function.  See PaceArt for details.   Assess/Plan: 1. Chronic systolic heart failure - his symptoms  are class 2-3. I encouraged him to reduce his sodium intake and lose weight. 2. Obesity - he has gained 20 lbs after stopping smoking. He is encouraged to lose more. 3. ICD - interrogation of his medtronic biv ICD demonstrates an increase in the pacing threshold 2-3 and I have had  his programming changed to 1-coil. 4. Atrial fib - his VR is well controlled s/p AV node ablation.  Cristopher Peru, M.D.

## 2019-05-31 NOTE — Patient Instructions (Signed)
Medication Instructions:  Your physician recommends that you continue on your current medications as directed. Please refer to the Current Medication list given to you today.  Labwork: None ordered.  Testing/Procedures: None ordered.  Follow-Up: Your physician wants you to follow-up in: one year with Dr. Lovena Le.   You will receive a reminder letter in the mail two months in advance. If you don't receive a letter, please call our office to schedule the follow-up appointment.  Remote monitoring is used to monitor your ICD from home. This monitoring reduces the number of office visits required to check your device to one time per year. It allows [place] to keep an eye on the functioning of your device to ensure it is working properly. You are scheduled for a device check from home on 06/22/2019. You may send your transmission at any time that day. If you have a wireless device, the transmission will be sent automatically. After your physician reviews your transmission, you will receive a postcard with your next transmission date.  Any Other Special Instructions Will Be Listed Below (If Applicable).  If you need a refill on your cardiac medications before your next appointment, please call your pharmacy.

## 2019-06-02 DIAGNOSIS — E1169 Type 2 diabetes mellitus with other specified complication: Secondary | ICD-10-CM | POA: Diagnosis not present

## 2019-06-02 DIAGNOSIS — Z23 Encounter for immunization: Secondary | ICD-10-CM | POA: Diagnosis not present

## 2019-06-02 DIAGNOSIS — Z0000 Encounter for general adult medical examination without abnormal findings: Secondary | ICD-10-CM | POA: Diagnosis not present

## 2019-06-02 DIAGNOSIS — E669 Obesity, unspecified: Secondary | ICD-10-CM | POA: Diagnosis not present

## 2019-06-02 DIAGNOSIS — Z7901 Long term (current) use of anticoagulants: Secondary | ICD-10-CM | POA: Diagnosis not present

## 2019-06-02 DIAGNOSIS — Z6838 Body mass index (BMI) 38.0-38.9, adult: Secondary | ICD-10-CM | POA: Diagnosis not present

## 2019-06-02 DIAGNOSIS — I10 Essential (primary) hypertension: Secondary | ICD-10-CM | POA: Diagnosis not present

## 2019-06-02 DIAGNOSIS — G4733 Obstructive sleep apnea (adult) (pediatric): Secondary | ICD-10-CM | POA: Diagnosis not present

## 2019-06-02 DIAGNOSIS — J449 Chronic obstructive pulmonary disease, unspecified: Secondary | ICD-10-CM | POA: Diagnosis not present

## 2019-06-02 DIAGNOSIS — E7800 Pure hypercholesterolemia, unspecified: Secondary | ICD-10-CM | POA: Diagnosis not present

## 2019-06-16 ENCOUNTER — Other Ambulatory Visit: Payer: Self-pay

## 2019-06-16 ENCOUNTER — Encounter: Payer: Self-pay | Admitting: *Deleted

## 2019-06-16 ENCOUNTER — Encounter: Payer: Self-pay | Admitting: Cardiology

## 2019-06-16 ENCOUNTER — Ambulatory Visit (INDEPENDENT_AMBULATORY_CARE_PROVIDER_SITE_OTHER): Payer: Medicare Other | Admitting: Cardiology

## 2019-06-16 VITALS — BP 110/70 | HR 65 | Ht 67.0 in | Wt 238.8 lb

## 2019-06-16 DIAGNOSIS — I110 Hypertensive heart disease with heart failure: Secondary | ICD-10-CM

## 2019-06-16 DIAGNOSIS — I482 Chronic atrial fibrillation, unspecified: Secondary | ICD-10-CM

## 2019-06-16 DIAGNOSIS — I5022 Chronic systolic (congestive) heart failure: Secondary | ICD-10-CM

## 2019-06-16 DIAGNOSIS — Z95810 Presence of automatic (implantable) cardiac defibrillator: Secondary | ICD-10-CM

## 2019-06-16 DIAGNOSIS — I428 Other cardiomyopathies: Secondary | ICD-10-CM | POA: Diagnosis not present

## 2019-06-16 DIAGNOSIS — Z7901 Long term (current) use of anticoagulants: Secondary | ICD-10-CM

## 2019-06-16 DIAGNOSIS — I447 Left bundle-branch block, unspecified: Secondary | ICD-10-CM | POA: Diagnosis not present

## 2019-06-16 NOTE — Progress Notes (Signed)
Cardiology Office Note:    Date:  06/16/2019   ID:  Jaime Scott, DOB 29-Jun-1951, MRN [id number]  PCP:  Greig Right, MD  Cardiologist:  Shirlee More, MD    Referring MD: Greig Right, MD    ASSESSMENT:    1. Chronic systolic heart failure ([place])   2. Hypertensive heart disease with chronic systolic congestive heart failure ([place])   3. LBBB (left bundle branch block)   4. Nonischemic cardiomyopathy ([place])   5. Chronic atrial fibrillation (H[facility])   6. Long term (current) use of anticoagulants   7. ICD (implantable cardioverter-defibrillator) in place    PLAN:    In order of problems listed above:  1. Stable compensated continue his current treatment loop diuretic beta-blocker ACE declines intensification with Entresto. 2. Stable BP at target continue guideline directed treatment 3. Stable cardiomyopathy ICD attempts at CRT in the past have failed and he has an ICD pacemaker with normal function 4. Rate controlled atrial fibrillation with beta-blocker and warfarin anticoagulation   Next appointment: 6 months   Medication Adjustments/Labs and Tests Ordered: Current medicines are reviewed at length with the patient today.  Concerns regarding medicines are outlined above.  No orders of the defined types were placed in this encounter.  No orders of the defined types were placed in this encounter.   Chief Complaint  Patient presents with  . Follow-up  . Congestive Heart Failure    History of Present Illness:     Jaime Scott is a 69 y.o. male with a hx of  heart failure, LBBB, nonischemic cardiomyopathy and EF 25% MUGA 03/21/17 , ICD with failed attempt at placing left ventricular pacing lead for cardiac resynchronization. Implantable defibrillator in place with backup ventricular pacing available if required, hypertension, and chronic atrial fibrillation  last seen 03/15/2018. Compliance with diet, lifestyle and medications: Yes  Overall he is pleased with the  quality of his wife wearing ambulatory oxygen is comfortable with get out of the house and sharp he said no progression of his exertional shortness of breath.  He has had no edema orthopnea chest pain palpitation or syncope.  We discussed intensifying medications with Delene Loll and he said the past he could not afford it and I think what we should do is except his current medical regimen is optimal for him with his loop diuretic beta-blocker ACE inhibitor and warfarin.  He tells me his INR runs in the range of 2.0-2.5 managed with his primary care physician.  Recent labs 06/02/2019 cholesterol 158 HDL 54 LDL 83 A1c 6.5% BUN was normal 10.0.  I do not have potassium creatinine or liver function results Past Medical History:  Diagnosis Date  . Alcohol abuse   . Arthritis   . CHF (congestive heart failure) ([place]) 03/12/2017  . Chronic atrial fibrillation ([place]) 03/12/2017  . COPD (chronic obstructive pulmonary disease) ([place]) 03/12/2017  . Dyspnea    wears Oxygen 24/7 3 L  . Essential hypertension 03/12/2017  . Hyperlipidemia 03/12/2017  . Ischemic cardiomyopathy 03/12/2017  . Long term (current) use of anticoagulants 03/12/2017  . Peripheral vascular disease ([place]) 03/12/2017  . 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 Type 2 diabetes mellitus without complications ([place]) 123XX123    Past Surgical History:  Procedure Laterality Date  . AV NODE ABLATION N/A 08/02/2018   Procedure: AV NODE ABLATION;  Surgeon: Evans Lance, MD;  Location: [place] [facility];  Service:  Cardiovascular;  Laterality: N/A;  . BIV ICD INSERTION CRT-D N/A 07/30/2018   Procedure: BIV ICD INSERTION CRT-D;  Surgeon: Evans Lance, MD;  Location: [facility];  Service: Cardiovascular;  Laterality: N/A;  . [place] N/A 05/27/2018   Procedure: BIV PACEMAKER GENERATOR UPGRADE - Aborted;  Surgeon: Constance Haw, MD;  Location: [facility];  Service: Cardiovascular;  Laterality: N/A;  . CHOLECYSTECTOMY    . COLONOSCOPY    . ERCP     stone removal  . EXCISION OF BREAST BIOPSY     Age 72, benign  . PACEMAKER LEAD REMOVAL N/A 07/30/2018   Procedure: PACEMAKER LEAD REMOVAL;  Surgeon: Evans Lance, MD;  Location: [facility] OR;  Service: Cardiovascular;  Laterality: N/A;    Current Medications: Current Meds  Medication Sig  . diphenhydrAMINE (BENADRYL) 25 MG tablet Take 25 mg by mouth every 6 (six) hours as needed for allergies.   Marland Kitchen enalapril (VASOTEC) 5 MG tablet TAKE ONE TABLET BY MOUTH EVERY DAY  . furosemide (LASIX) 80 MG tablet Take 80 mg by mouth as directed. 2 tablets in the morning and 1 tablets in the evening   . ipratropium (ATROVENT) 0.02 % nebulizer solution Take 0.5 mg by nebulization every 6 (six) hours as needed for wheezing or shortness of breath.  . metFORMIN (GLUCOPHAGE-XR) 500 MG 24 hr tablet Take 1,000 mg by mouth 2 (two) times daily.   . metoprolol succinate (TOPROL-XL) 25 MG 24 hr tablet TAKE ONE TABLET BY MOUTH EVERY DAY WITH FOOD  . OXYGEN Inhale 3 L into the lungs as needed.  . potassium chloride SA (K-DUR,KLOR-CON) 20 MEQ tablet Take 20 mEq by mouth every evening.   . simvastatin (ZOCOR) 20 MG tablet Take 20 mg by mouth every evening.   . warfarin (COUMADIN) 1 MG tablet Take 1 mg by mouth daily.   Marland Kitchen warfarin (COUMADIN) 5 MG tablet Take 5 mg by mouth daily at 8 pm.     Allergies:   Ivp dye [iodinated diagnostic agents]   Social History   Socioeconomic History  . Marital status: Divorced    Spouse name: Not on file  . Number of children: Not on file  . Years of education: Not on file  . Highest education level: Not on file  Occupational History  . Not on file  Social Needs  . Financial resource strain: Not on file  . Food insecurity    Worry: Not on file    Inability: Not on file  . Transportation needs    Medical: Not on file    Non-medical: Not on file  Tobacco Use  . Smoking status:  Former Smoker    Packs/day: 1.00    Years: 61.00    Pack years: 61.00    Types: Cigarettes    Quit date: 08/2018    Years since quitting: 0.8  . Smokeless tobacco: Never Used  Substance and Sexual Activity  . Alcohol use: No  . Drug use: No  . Sexual activity: Not on file  Lifestyle  . Physical activity    Days per week: Not on file    Minutes per session: Not on file  . Stress: Not on file  Relationships  . Social Herbalist on phone: Not on file    Gets together: Not on file    Attends religious service: Not on file    Active member of club or organization: Not on file  Attends meetings of clubs or organizations: Not on file    Relationship status: Not on file  Other Topics Concern  . Not on file  Social History Narrative  . Not on file     Family History: The patient's family history includes Alcohol abuse in his brother and brother; Breast cancer in his sister. ROS:   Please see the history of present illness.    All other systems reviewed and are negative.  EKGs/Labs/Other Studies Reviewed:    The following studies were reviewed today:  Recent pacemaker device check reviewed with the patient  Recent Labs: 07/30/2018: Hemoglobin 12.9; Platelets 260 08/02/2018: BUN 8; Creatinine, Ser 0.58; Potassium 3.6; Sodium 139  Recent Lipid Panel No results found for: CHOL, TRIG, HDL, CHOLHDL, VLDL, LDLCALC, LDLDIRECT  Physical Exam:    VS:  BP 110/70 (BP Location: Right Arm, Patient Position: Sitting, Cuff Size: Large)   Pulse 65   Ht 5\' 7"  (1.702 m)   Wt 238 lb 12.8 oz (108.3 kg)   SpO2 99%   BMI 37.40 kg/m     Wt Readings from Last 3 Encounters:  06/16/19 238 lb 12.8 oz (108.3 kg)  05/31/19 240 lb (108.9 kg)  08/09/18 232 lb 12.8 oz (105.6 kg)     GEN: He appears better than when I seen him before he does no longer look chronically ill well nourished, well developed in no acute distress HEENT: Normal NECK: No JVD; No carotid bruits LYMPHATICS: No  lymphadenopathy CARDIAC: Irregular rhythm S1 variable  no murmurs, rubs, gallops RESPIRATORY:  Clear to auscultation without rales, wheezing or rhonchi  ABDOMEN: Soft, non-tender, non-distended MUSCULOSKELETAL:  No edema; No deformity  SKIN: Warm and dry NEUROLOGIC:  Alert and oriented x 3 PSYCHIATRIC:  Normal affect    Signed, Shirlee More, MD  06/16/2019 1:57 PM    [facility]

## 2019-06-16 NOTE — Patient Instructions (Signed)
Medication Instructions:  Your physician recommends that you continue on your current medications as directed. Please refer to the Current Medication list given to you today.  *If you need a refill on your cardiac medications before your next appointment, please call your pharmacy*  Lab Work: None  If you have labs (blood work) drawn today and your tests are completely normal, you will receive your results only by: Marland Kitchen MyChart Message (if you have MyChart) OR . A paper copy in the mail If you have any lab test that is abnormal or we need to change your treatment, we will call you to review the results.  Testing/Procedures: None  Follow-Up: At [facility], you and your health needs are our priority.  As part of our continuing mission to provide you with exceptional heart care, we have created designated Provider Care Teams.  These Care Teams include your primary Cardiologist (physician) and Advanced Practice Providers (APPs -  Physician Assistants and Nurse Practitioners) who all work together to provide you with the care you need, when you need it.  Your next appointment:   6 month(s)  The format for your next appointment:   In Person  Provider:   Shirlee More, MD

## 2019-06-21 ENCOUNTER — Other Ambulatory Visit: Payer: Self-pay | Admitting: Cardiology

## 2019-06-22 ENCOUNTER — Other Ambulatory Visit: Payer: Self-pay

## 2019-06-22 ENCOUNTER — Ambulatory Visit (INDEPENDENT_AMBULATORY_CARE_PROVIDER_SITE_OTHER): Payer: Medicare Other | Admitting: *Deleted

## 2019-06-22 DIAGNOSIS — I5022 Chronic systolic (congestive) heart failure: Secondary | ICD-10-CM | POA: Diagnosis not present

## 2019-06-22 LAB — CUP PACEART REMOTE DEVICE CHECK
Battery Remaining Longevity: 43 mo
Battery Voltage: 2.97 V
Brady Statistic AP VP Percent: 0 %
Brady Statistic AP VS Percent: 0 %
Brady Statistic AS VP Percent: 0 %
Brady Statistic AS VS Percent: 0 %
Brady Statistic RA Percent Paced: 0 %
Brady Statistic RV Percent Paced: 99.86 %
Date Time Interrogation Session: 20201125012403
HighPow Impedance: 78 Ohm
Implantable Lead Implant Date: 20200103
Implantable Lead Implant Date: 20200103
Implantable Lead Location: 753858
Implantable Lead Location: 753860
Implantable Lead Model: 4598
Implantable Lead Model: 6935
Implantable Pulse Generator Implant Date: 20200103
Lead Channel Impedance Value: 149.625
Lead Channel Impedance Value: 149.625
Lead Channel Impedance Value: 159.6 Ohm
Lead Channel Impedance Value: 159.6 Ohm
Lead Channel Impedance Value: 184.154
Lead Channel Impedance Value: 266 Ohm
Lead Channel Impedance Value: 266 Ohm
Lead Channel Impedance Value: 342 Ohm
Lead Channel Impedance Value: 380 Ohm
Lead Channel Impedance Value: 380 Ohm
Lead Channel Impedance Value: 399 Ohm
Lead Channel Impedance Value: 4047 Ohm
Lead Channel Impedance Value: 437 Ohm
Lead Channel Impedance Value: 513 Ohm
Lead Channel Impedance Value: 551 Ohm
Lead Channel Impedance Value: 551 Ohm
Lead Channel Impedance Value: 570 Ohm
Lead Channel Impedance Value: 646 Ohm
Lead Channel Pacing Threshold Amplitude: 0.5 V
Lead Channel Pacing Threshold Amplitude: 3.25 V
Lead Channel Pacing Threshold Pulse Width: 0.4 ms
Lead Channel Pacing Threshold Pulse Width: 1 ms
Lead Channel Sensing Intrinsic Amplitude: 7.25 mV
Lead Channel Sensing Intrinsic Amplitude: 7.25 mV
Lead Channel Setting Pacing Amplitude: 2.5 V
Lead Channel Setting Pacing Amplitude: 4.25 V
Lead Channel Setting Pacing Pulse Width: 0.4 ms
Lead Channel Setting Pacing Pulse Width: 1 ms
Lead Channel Setting Sensing Sensitivity: 0.3 mV

## 2019-07-19 NOTE — Progress Notes (Signed)
ICD remote 

## 2019-08-04 LAB — CUP PACEART INCLINIC DEVICE CHECK
Date Time Interrogation Session: 20201103112626
Implantable Lead Implant Date: 20200103
Implantable Lead Implant Date: 20200103
Implantable Lead Location: 753858
Implantable Lead Location: 753860
Implantable Lead Model: 4598
Implantable Lead Model: 6935
Implantable Pulse Generator Implant Date: 20200103

## 2019-08-31 DIAGNOSIS — R0602 Shortness of breath: Secondary | ICD-10-CM | POA: Diagnosis not present

## 2019-08-31 DIAGNOSIS — R609 Edema, unspecified: Secondary | ICD-10-CM | POA: Diagnosis not present

## 2019-09-14 DIAGNOSIS — E1169 Type 2 diabetes mellitus with other specified complication: Secondary | ICD-10-CM | POA: Diagnosis not present

## 2019-09-14 DIAGNOSIS — I10 Essential (primary) hypertension: Secondary | ICD-10-CM | POA: Diagnosis not present

## 2019-09-14 DIAGNOSIS — I4891 Unspecified atrial fibrillation: Secondary | ICD-10-CM | POA: Diagnosis not present

## 2019-09-14 DIAGNOSIS — E7800 Pure hypercholesterolemia, unspecified: Secondary | ICD-10-CM | POA: Diagnosis not present

## 2019-09-21 ENCOUNTER — Ambulatory Visit (INDEPENDENT_AMBULATORY_CARE_PROVIDER_SITE_OTHER): Payer: Medicare Other | Admitting: *Deleted

## 2019-09-21 DIAGNOSIS — I5022 Chronic systolic (congestive) heart failure: Secondary | ICD-10-CM

## 2019-09-21 LAB — CUP PACEART REMOTE DEVICE CHECK
Battery Remaining Longevity: 38 mo
Battery Voltage: 2.96 V
Brady Statistic AP VP Percent: 0 %
Brady Statistic AP VS Percent: 0 %
Brady Statistic AS VP Percent: 0 %
Brady Statistic AS VS Percent: 0 %
Brady Statistic RA Percent Paced: 0 %
Brady Statistic RV Percent Paced: 99.79 %
Date Time Interrogation Session: 20210224043823
HighPow Impedance: 88 Ohm
Implantable Lead Implant Date: 20200103
Implantable Lead Implant Date: 20200103
Implantable Lead Location: 753858
Implantable Lead Location: 753860
Implantable Lead Model: 4598
Implantable Lead Model: 6935
Implantable Pulse Generator Implant Date: 20200103
Lead Channel Impedance Value: 155.455
Lead Channel Impedance Value: 155.455
Lead Channel Impedance Value: 162.857
Lead Channel Impedance Value: 162.857
Lead Channel Impedance Value: 180 Ohm
Lead Channel Impedance Value: 285 Ohm
Lead Channel Impedance Value: 285 Ohm
Lead Channel Impedance Value: 342 Ohm
Lead Channel Impedance Value: 380 Ohm
Lead Channel Impedance Value: 380 Ohm
Lead Channel Impedance Value: 4047 Ohm
Lead Channel Impedance Value: 437 Ohm
Lead Channel Impedance Value: 494 Ohm
Lead Channel Impedance Value: 513 Ohm
Lead Channel Impedance Value: 551 Ohm
Lead Channel Impedance Value: 551 Ohm
Lead Channel Impedance Value: 570 Ohm
Lead Channel Impedance Value: 646 Ohm
Lead Channel Pacing Threshold Amplitude: 0.375 V
Lead Channel Pacing Threshold Amplitude: 3.75 V
Lead Channel Pacing Threshold Pulse Width: 0.4 ms
Lead Channel Pacing Threshold Pulse Width: 1 ms
Lead Channel Sensing Intrinsic Amplitude: 14.25 mV
Lead Channel Sensing Intrinsic Amplitude: 14.25 mV
Lead Channel Setting Pacing Amplitude: 2.5 V
Lead Channel Setting Pacing Amplitude: 4.75 V
Lead Channel Setting Pacing Pulse Width: 0.4 ms
Lead Channel Setting Pacing Pulse Width: 1 ms
Lead Channel Setting Sensing Sensitivity: 0.3 mV

## 2019-09-21 NOTE — Progress Notes (Signed)
ICD Remote  

## 2019-10-10 DIAGNOSIS — I4891 Unspecified atrial fibrillation: Secondary | ICD-10-CM | POA: Diagnosis not present

## 2019-10-14 DIAGNOSIS — Z7901 Long term (current) use of anticoagulants: Secondary | ICD-10-CM | POA: Diagnosis not present

## 2019-11-21 ENCOUNTER — Other Ambulatory Visit: Payer: Self-pay | Admitting: Physician Assistant

## 2019-11-24 DIAGNOSIS — I4891 Unspecified atrial fibrillation: Secondary | ICD-10-CM | POA: Diagnosis not present

## 2019-12-01 DIAGNOSIS — Z7901 Long term (current) use of anticoagulants: Secondary | ICD-10-CM | POA: Diagnosis not present

## 2019-12-12 DIAGNOSIS — E1169 Type 2 diabetes mellitus with other specified complication: Secondary | ICD-10-CM | POA: Diagnosis not present

## 2019-12-12 DIAGNOSIS — H6523 Chronic serous otitis media, bilateral: Secondary | ICD-10-CM | POA: Diagnosis not present

## 2019-12-12 DIAGNOSIS — I5022 Chronic systolic (congestive) heart failure: Secondary | ICD-10-CM | POA: Diagnosis not present

## 2019-12-12 DIAGNOSIS — J9611 Chronic respiratory failure with hypoxia: Secondary | ICD-10-CM | POA: Diagnosis not present

## 2019-12-12 DIAGNOSIS — I4891 Unspecified atrial fibrillation: Secondary | ICD-10-CM | POA: Diagnosis not present

## 2019-12-12 DIAGNOSIS — J449 Chronic obstructive pulmonary disease, unspecified: Secondary | ICD-10-CM | POA: Diagnosis not present

## 2019-12-12 DIAGNOSIS — I110 Hypertensive heart disease with heart failure: Secondary | ICD-10-CM | POA: Diagnosis not present

## 2019-12-12 DIAGNOSIS — I10 Essential (primary) hypertension: Secondary | ICD-10-CM | POA: Diagnosis not present

## 2019-12-12 DIAGNOSIS — H9193 Unspecified hearing loss, bilateral: Secondary | ICD-10-CM | POA: Diagnosis not present

## 2019-12-12 DIAGNOSIS — Z7901 Long term (current) use of anticoagulants: Secondary | ICD-10-CM | POA: Diagnosis not present

## 2019-12-13 DIAGNOSIS — E1169 Type 2 diabetes mellitus with other specified complication: Secondary | ICD-10-CM | POA: Diagnosis not present

## 2019-12-13 DIAGNOSIS — Z7901 Long term (current) use of anticoagulants: Secondary | ICD-10-CM | POA: Diagnosis not present

## 2019-12-19 ENCOUNTER — Other Ambulatory Visit: Payer: Self-pay | Admitting: Cardiology

## 2019-12-19 LAB — CUP PACEART REMOTE DEVICE CHECK
Battery Remaining Longevity: 19 mo
Battery Voltage: 2.94 V
Brady Statistic AP VP Percent: 0 %
Brady Statistic AP VS Percent: 0 %
Brady Statistic AS VP Percent: 0 %
Brady Statistic AS VS Percent: 0 %
Brady Statistic RA Percent Paced: 0 %
Brady Statistic RV Percent Paced: 99.86 %
Date Time Interrogation Session: 20210524041657
HighPow Impedance: 76 Ohm
Implantable Lead Implant Date: 20200103
Implantable Lead Implant Date: 20200103
Implantable Lead Location: 753858
Implantable Lead Location: 753860
Implantable Lead Model: 4598
Implantable Lead Model: 6935
Implantable Pulse Generator Implant Date: 20200103
Lead Channel Impedance Value: 137.586
Lead Channel Impedance Value: 137.586
Lead Channel Impedance Value: 137.586
Lead Channel Impedance Value: 137.586
Lead Channel Impedance Value: 142.5 Ohm
Lead Channel Impedance Value: 266 Ohm
Lead Channel Impedance Value: 266 Ohm
Lead Channel Impedance Value: 285 Ohm
Lead Channel Impedance Value: 285 Ohm
Lead Channel Impedance Value: 342 Ohm
Lead Channel Impedance Value: 380 Ohm
Lead Channel Impedance Value: 4047 Ohm
Lead Channel Impedance Value: 437 Ohm
Lead Channel Impedance Value: 456 Ohm
Lead Channel Impedance Value: 456 Ohm
Lead Channel Impedance Value: 456 Ohm
Lead Channel Impedance Value: 456 Ohm
Lead Channel Impedance Value: 513 Ohm
Lead Channel Pacing Threshold Amplitude: 0.5 V
Lead Channel Pacing Threshold Amplitude: 5.5 V
Lead Channel Pacing Threshold Pulse Width: 0.4 ms
Lead Channel Pacing Threshold Pulse Width: 1 ms
Lead Channel Sensing Intrinsic Amplitude: 20.125 mV
Lead Channel Sensing Intrinsic Amplitude: 20.125 mV
Lead Channel Setting Pacing Amplitude: 2.5 V
Lead Channel Setting Pacing Amplitude: 6 V
Lead Channel Setting Pacing Pulse Width: 0.4 ms
Lead Channel Setting Pacing Pulse Width: 1 ms
Lead Channel Setting Sensing Sensitivity: 0.3 mV

## 2019-12-20 ENCOUNTER — Telehealth: Payer: Self-pay

## 2019-12-20 NOTE — Telephone Encounter (Signed)
Scheduled transmission received- Elevated optivol.  Spoke with pt, he reports some SOB intermittently.  He is not currently taking Lasix as ordered.  He has been taking 120mg  in AM and 80mg  in PM.  Rx indicates 160mg  am and 80mg  PM.  For next few days at least he willl take as directed.

## 2019-12-21 ENCOUNTER — Ambulatory Visit (INDEPENDENT_AMBULATORY_CARE_PROVIDER_SITE_OTHER): Payer: Medicare Other | Admitting: *Deleted

## 2019-12-21 DIAGNOSIS — I5022 Chronic systolic (congestive) heart failure: Secondary | ICD-10-CM

## 2019-12-21 DIAGNOSIS — I482 Chronic atrial fibrillation, unspecified: Secondary | ICD-10-CM | POA: Diagnosis not present

## 2019-12-21 NOTE — Progress Notes (Signed)
Remote ICD transmission.   

## 2020-01-06 ENCOUNTER — Telehealth: Payer: Self-pay | Admitting: *Deleted

## 2020-01-06 NOTE — Telephone Encounter (Signed)
-----   Message from Evans Lance, MD sent at 01/04/2020  9:37 PM EDT ----- Lets see him in the office. GT ----- Message ----- From: Stanton Kidney, RN Sent: 01/04/2020   8:20 AM EDT To: Evans Lance, MD, Damian Leavell, RN  Forwarding to Dr. Lovena Le and his nurse ----- Message ----- From: Constance Haw, MD Sent: 12/23/2019   7:35 AM EDT To: Stanton Kidney, RN  Should be referred back to taylor to advise. ----- Message ----- From: Stanton Kidney, RN Sent: 12/22/2019   5:59 PM EDT To: Constance Haw, MD, Stanton Kidney, RN  Dr. Curt Bears - see device clinic note by Trena Platt 5/25 - pt is taking more than 80 mg BID.  Also I have this pt as Lovena Le pt - my note says 07/2018 - GT pt d/t he explanted/AVN ablated/reimplanted and has been following pt.  You have never seen this pt in office.Marland KitchenMarland KitchenMarland KitchenMarland Kitchenplease advise Lovena Le recall for Nov, seen by GT last Nov)

## 2020-01-06 NOTE — Telephone Encounter (Signed)
Spoke with patient to schedule for LV threshold testing/potential reprogramming per Dr. Lovena Le. RV lead trends stable. Pt requested appointment after 01/10/20 as he has f/u with Dr. Bettina Gavia that day. He is agreeable to appointment with A. Tillery, PA-C, on 01/27/20 at 10:05am and is aware it is at the [facility] office. Dr. Lovena Le is in the office that day. Pt denies any questions or concerns at this time.

## 2020-01-09 NOTE — Progress Notes (Signed)
Cardiology Office Note:    Date:  01/10/2020   ID:  Jaime Scott, DOB 27-Feb-1951, MRN [id number]  PCP:  Greig Right, MD  Cardiologist:  Shirlee More, MD    Referring MD: Greig Right, MD    ASSESSMENT:    1. Chronic systolic heart failure ([place])   2. Chronic atrial fibrillation (HCC)   3. Long term (current) use of anticoagulants   4. ICD (implantable cardioverter-defibrillator) in place   5. Hypertensive heart disease with chronic systolic congestive heart failure ([place])    PLAN:    In order of problems listed above:  1. He has a severely reduced ejection fraction and will recheck his echocardiogram regarding asynchronous contractility.  He is on maximally tolerated medical therapy very high his dose diuretic I will increase his furosemide again in the days in [facility] July 2 will check BMP proBNP.  I told him that I think would help him is to switch from ACE inhibitor to [facility] and he told me he cannot afford it.  We will see if we get a social service consult the days in [place]. 2. Stable he is ventricularly paced and anticoagulated. 3. ICD is followed in our practice he has a CRT ICD device and perhaps consideration of Purkinje pacing could be helpful we will recheck an echocardiogram regarding asynchronous contractility 4. Heart failure remains decompensated class II to class III I see no choice but to increase the dose of his loop diuretic   Next appointment: 3 months   Medication Adjustments/Labs and Tests Ordered: Current medicines are reviewed at length with the patient today.  Concerns regarding medicines are outlined above.  Orders Placed This Encounter  Procedures  . Basic Metabolic Panel (BMET)  . B Nat Peptide   No orders of the defined types were placed in this encounter.   No chief complaint on file.   History of Present Illness:    Jaime Scott is a 69 y.o. male with a hx of heart failure, LBBB, nonischemic cardiomyopathy and EF 25%  MUGA 03/21/17 , ICD with failed attempt at placing left ventricular pacing lead for cardiac resynchronization. Implantable defibrillator in place with backup ventricular pacing available if required, hypertension, and chronic atrial fibrillation last seen 06/16/2019. Compliance with diet, lifestyle and medications: Yes  His last device check 12/19/2019 showed optive all elevators and chronic elevated LV threshold was higher and was reprogrammed at 2 brief episodes of nonsustained VT.  Is a very difficult case he previously took Ship [employer] for a month felt much better but told me even with insurance he could not and 4-3 $100 a month is not an option.  He is not having good results from biventricular pacing he is going to be seen by EP in the office in July and I told him perhaps upgrading to Purkinje pacing is an option.  He remains short of breath even walking indoors he does not have orthopnea but sleeps with oxygen he has edema no chest pain palpitation or syncope.  Recent labs done at his primary care physician's office 12/14/2019 cholesterol 151 HDL 48 LDL at target 80 A1c 6.8% at target his creatinine was normal.  His EKG in office shows underlying atrial fibrillation 100% paced with a wide QRS complex. Past Medical History:  Diagnosis Date  . Alcohol abuse   . Arthritis   . CHF (congestive heart failure) ([place]) 03/12/2017  . Chronic atrial fibrillation ([place]) 03/12/2017  . COPD (chronic obstructive pulmonary disease) ([place]) 03/12/2017  . Dyspnea  wears Oxygen 24/7 3 L  . Essential hypertension 03/12/2017  . Hyperlipidemia 03/12/2017  . Ischemic cardiomyopathy 03/12/2017  . Long term (current) use of anticoagulants 03/12/2017  . Peripheral vascular disease ([place]) 03/12/2017  . 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 Type 2 diabetes mellitus without complications ([place]) 03/28/5175    Past Surgical History:  Procedure Laterality  Date  . AV NODE ABLATION N/A 08/02/2018   Procedure: AV NODE ABLATION;  Surgeon: Evans Lance, MD;  Location: [place] [facility];  Service: Cardiovascular;  Laterality: N/A;  . BIV ICD INSERTION CRT-D N/A 07/30/2018   Procedure: BIV ICD INSERTION CRT-D;  Surgeon: Evans Lance, MD;  Location: [place];  Service: Cardiovascular;  Laterality: N/A;  . [place] N/A 05/27/2018   Procedure: BIV PACEMAKER GENERATOR UPGRADE - Aborted;  Surgeon: Constance Haw, MD;  Location: [place] [facility];  Service: Cardiovascular;  Laterality: N/A;  . CHOLECYSTECTOMY    . COLONOSCOPY    . ERCP     stone removal  . EXCISION OF BREAST BIOPSY     Age 74, benign  . PACEMAKER LEAD REMOVAL N/A 07/30/2018   Procedure: PACEMAKER LEAD REMOVAL;  Surgeon: Evans Lance, MD;  Location: [facility];  Service: Cardiovascular;  Laterality: N/A;    Current Medications: Current Meds  Medication Sig  . diphenhydrAMINE (BENADRYL) 25 MG tablet Take 25 mg by mouth every 6 (six) hours as needed for allergies.   Marland Kitchen enalapril (VASOTEC) 5 MG tablet TAKE 1 TABLET BY MOUTH ONCE DAILY  . furosemide (LASIX) 80 MG tablet Take 80 mg by mouth as directed. 2 tablets in the morning and 1 tablets in the evening   . ipratropium (ATROVENT) 0.02 % nebulizer solution Take 0.5 mg by nebulization every 6 (six) hours as needed for wheezing or shortness of breath.  . metFORMIN (GLUCOPHAGE-XR) 500 MG 24 hr tablet Take 1,000 mg by mouth 2 (two) times daily.   . metoprolol succinate (TOPROL-XL) 25 MG 24 hr tablet TAKE 1 TABLET BY MOUTH ONCE DAILY WITH FOOD  . potassium chloride SA (K-DUR,KLOR-CON) 20 MEQ tablet Take 20 mEq by mouth every evening.   . simvastatin (ZOCOR) 20 MG tablet Take 20 mg by mouth every evening.   Marland Kitchen spironolactone (ALDACTONE) 25 MG tablet Take 12.5 mg by mouth daily.  Marland Kitchen warfarin (COUMADIN) 1 MG tablet Take 1 mg by mouth daily.   Marland Kitchen warfarin (COUMADIN) 5 MG tablet Take 5 mg by mouth daily at 8 pm.      Allergies:   Ivp dye [iodinated diagnostic agents]   Social History   Socioeconomic History  . Marital status: Divorced    Spouse name: Not on file  . Number of children: Not on file  . Years of education: Not on file  . Highest education level: Not on file  Occupational History  . Not on file  Tobacco Use  . Smoking status: Former Smoker    Packs/day: 1.00    Years: 61.00    Pack years: 61.00    Types: Cigarettes    Quit date: 08/2018    Years since quitting: 1.3  . Smokeless tobacco: Never Used  Vaping Use  . Vaping Use: Never used  Substance and Sexual Activity  . Alcohol use: No  . Drug use: No  . Sexual activity: Not on file  Other Topics Concern  . Not on file  Social  History Narrative  . Not on file   Social Determinants of Health   Financial Resource Strain:   . Difficulty of Paying Living Expenses:   Food Insecurity:   . Worried About Charity fundraiser in the Last Year:   . Arboriculturist in the Last Year:   Transportation Needs:   . Film/video editor (Medical):   Marland Kitchen Lack of Transportation (Non-Medical):   Physical Activity:   . Days of Exercise per Week:   . Minutes of Exercise per Session:   Stress:   . Feeling of Stress :   Social Connections:   . Frequency of Communication with Friends and Family:   . Frequency of Social Gatherings with Friends and Family:   . Attends Religious Services:   . Active Member of Clubs or Organizations:   . Attends Archivist Meetings:   Marland Kitchen Marital Status:      Family History: The patient's family history includes Alcohol abuse in his brother and brother; Breast cancer in his sister. ROS:   Please see the history of present illness.    All other systems reviewed and are negative.  EKGs/Labs/Other Studies Reviewed:    The following studies were reviewed today:    Physical Exam:    VS:  BP (!) 112/54 (BP Location: Right Arm, Patient Position: Sitting, Cuff Size: Normal)   Pulse 75   Ht  5\' 7"  (1.702 m)   Wt 228 lb 3.2 oz (103.5 kg)   SpO2 93%   BMI 35.74 kg/m     Wt Readings from Last 3 Encounters:  01/10/20 228 lb 3.2 oz (103.5 kg)  06/16/19 238 lb 12.8 oz (108.3 kg)  05/31/19 240 lb (108.9 kg)     GEN:  Well nourished, well developed in no acute distress HEENT: Normal NECK: No JVD; No carotid bruits LYMPHATICS: No lymphadenopathy CARDIAC: Soft S1 S3 is present RRR, no murmurs, rubs, gallops RESPIRATORY:  Clear to auscultation without rales, wheezing or rhonchi  ABDOMEN: Soft, non-tender, non-distended MUSCULOSKELETAL: Lower extremity bilateral 1+ edema; No deformity  SKIN: Warm and dry NEUROLOGIC:  Alert and oriented x 3 PSYCHIATRIC:  Normal affect    Signed, Shirlee More, MD  01/10/2020 1:57 PM    [facility]

## 2020-01-10 ENCOUNTER — Encounter: Payer: Self-pay | Admitting: Cardiology

## 2020-01-10 ENCOUNTER — Other Ambulatory Visit: Payer: Self-pay

## 2020-01-10 ENCOUNTER — Ambulatory Visit (INDEPENDENT_AMBULATORY_CARE_PROVIDER_SITE_OTHER): Payer: Medicare Other | Admitting: Cardiology

## 2020-01-10 VITALS — BP 112/54 | HR 75 | Ht 67.0 in | Wt 228.2 lb

## 2020-01-10 DIAGNOSIS — Z7901 Long term (current) use of anticoagulants: Secondary | ICD-10-CM

## 2020-01-10 DIAGNOSIS — I110 Hypertensive heart disease with heart failure: Secondary | ICD-10-CM

## 2020-01-10 DIAGNOSIS — I482 Chronic atrial fibrillation, unspecified: Secondary | ICD-10-CM

## 2020-01-10 DIAGNOSIS — I5022 Chronic systolic (congestive) heart failure: Secondary | ICD-10-CM | POA: Diagnosis not present

## 2020-01-10 DIAGNOSIS — Z95810 Presence of automatic (implantable) cardiac defibrillator: Secondary | ICD-10-CM

## 2020-01-10 NOTE — Patient Instructions (Signed)
Medication Instructions:  Your physician recommends that you continue on your current medications as directed. Please refer to the Current Medication list given to you today.  Please take two tablets 160 mg total of your furosemide daily  in the morning. Then take 80 mg daily in the evening.  *If you need a refill on your cardiac medications before your next appointment, please call your pharmacy*   Lab Work: Your physician recommends that you return for lab work in: 2 weeks BMP, BNP If you have labs (blood work) drawn today and your tests are completely normal, you will receive your results only by: Marland Kitchen MyChart Message (if you have MyChart) OR . A paper copy in the mail If you have any lab test that is abnormal or we need to change your treatment, we will call you to review the results.   Testing/Procedures: Your physician has requested that you have an echocardiogram. Echocardiography is a painless test that uses sound waves to create images of your heart. It provides your doctor with information about the size and shape of your heart and how well your heart's chambers and valves are working. This procedure takes approximately one hour. There are no restrictions for this procedure.     Follow-Up: At [facility], you and your health needs are our priority.  As part of our continuing mission to provide you with exceptional heart care, we have created designated Provider Care Teams.  These Care Teams include your primary Cardiologist (physician) and Advanced Practice Providers (APPs -  Physician Assistants and Nurse Practitioners) who all work together to provide you with the care you need, when you need it.  We recommend signing up for the patient portal called "MyChart".  Sign up information is provided on this After Visit Summary.  MyChart is used to connect with patients for Virtual Visits (Telemedicine).  Patients are able to view lab/test results, encounter notes, upcoming  appointments, etc.  Non-urgent messages can be sent to your provider as well.   To learn more about what you can do with MyChart, go to [email or web address].    Your next appointment:   3 month(s)  The format for your next appointment:   In Person  Provider:   Shirlee More, MD   Other Instructions

## 2020-01-11 NOTE — Addendum Note (Signed)
Addended by: Jerl Santos R on: 01/11/2020 04:10 PM   Modules accepted: Orders

## 2020-01-13 ENCOUNTER — Telehealth: Payer: Self-pay

## 2020-01-13 NOTE — Telephone Encounter (Signed)
Called patient to discuss possible options regarding medication assistance. Left patient a message to call Care Guide back at [phone].

## 2020-01-17 ENCOUNTER — Ambulatory Visit (INDEPENDENT_AMBULATORY_CARE_PROVIDER_SITE_OTHER): Payer: Medicare Other

## 2020-01-17 ENCOUNTER — Other Ambulatory Visit: Payer: Self-pay

## 2020-01-17 DIAGNOSIS — I482 Chronic atrial fibrillation, unspecified: Secondary | ICD-10-CM

## 2020-01-17 DIAGNOSIS — Z95810 Presence of automatic (implantable) cardiac defibrillator: Secondary | ICD-10-CM | POA: Diagnosis not present

## 2020-01-17 DIAGNOSIS — Z7901 Long term (current) use of anticoagulants: Secondary | ICD-10-CM | POA: Diagnosis not present

## 2020-01-17 DIAGNOSIS — I110 Hypertensive heart disease with heart failure: Secondary | ICD-10-CM

## 2020-01-17 DIAGNOSIS — I5022 Chronic systolic (congestive) heart failure: Secondary | ICD-10-CM | POA: Diagnosis not present

## 2020-01-17 NOTE — Progress Notes (Signed)
Complete echocardiogram has been performed.  Jimmy Jillianne Gamino RDCS, RVT 

## 2020-01-25 ENCOUNTER — Telehealth: Payer: Self-pay | Admitting: *Deleted

## 2020-01-25 LAB — BASIC METABOLIC PANEL
BUN/Creatinine Ratio: 13 (ref 10–24)
BUN: 10 mg/dL (ref 8–27)
CO2: 32 mmol/L — ABNORMAL HIGH (ref 20–29)
Calcium: 10.2 mg/dL (ref 8.6–10.2)
Chloride: 92 mmol/L — ABNORMAL LOW (ref 96–106)
Creatinine, Ser: 0.8 mg/dL (ref 0.76–1.27)
GFR calc Af Amer: 105 mL/min/{1.73_m2} (ref >59)
GFR calc non Af Amer: 91 mL/min/{1.73_m2} (ref >59)
Glucose: 132 mg/dL — ABNORMAL HIGH (ref 65–99)
Potassium: 5.1 mmol/L (ref 3.5–5.2)
Sodium: 137 mmol/L (ref 134–144)

## 2020-01-25 LAB — BRAIN NATRIURETIC PEPTIDE: BNP: 175.1 pg/mL — ABNORMAL HIGH (ref 0.0–100.0)

## 2020-01-25 NOTE — Telephone Encounter (Signed)
-----   Message from Richardo Priest, MD sent at 01/25/2020  2:00 PM EDT ----- Normal or stable result  Good result no changes

## 2020-01-25 NOTE — Telephone Encounter (Signed)
Laurine Blazer, LPN  4/60/0298 4:73 PM EDT Back to Top    Notified, copy to pcp.

## 2020-01-27 ENCOUNTER — Other Ambulatory Visit: Payer: Self-pay

## 2020-01-27 ENCOUNTER — Ambulatory Visit (INDEPENDENT_AMBULATORY_CARE_PROVIDER_SITE_OTHER): Payer: Medicare Other | Admitting: Student

## 2020-01-27 ENCOUNTER — Telehealth: Payer: Self-pay

## 2020-01-27 ENCOUNTER — Encounter: Payer: Self-pay | Admitting: Student

## 2020-01-27 VITALS — BP 122/70 | HR 82 | Ht 67.0 in | Wt 233.0 lb

## 2020-01-27 DIAGNOSIS — I5022 Chronic systolic (congestive) heart failure: Secondary | ICD-10-CM

## 2020-01-27 DIAGNOSIS — Z0000 Encounter for general adult medical examination without abnormal findings: Secondary | ICD-10-CM

## 2020-01-27 DIAGNOSIS — Z95810 Presence of automatic (implantable) cardiac defibrillator: Secondary | ICD-10-CM | POA: Diagnosis not present

## 2020-01-27 DIAGNOSIS — I447 Left bundle-branch block, unspecified: Secondary | ICD-10-CM | POA: Diagnosis not present

## 2020-01-27 DIAGNOSIS — I482 Chronic atrial fibrillation, unspecified: Secondary | ICD-10-CM

## 2020-01-27 LAB — CUP PACEART INCLINIC DEVICE CHECK
Battery Remaining Longevity: 38 mo
Battery Voltage: 2.93 V
Brady Statistic AP VP Percent: 0 %
Brady Statistic AP VS Percent: 0 %
Brady Statistic AS VP Percent: 0 %
Brady Statistic AS VS Percent: 0 %
Brady Statistic RA Percent Paced: 0 %
Brady Statistic RV Percent Paced: 99.83 %
Date Time Interrogation Session: 20210702102917
HighPow Impedance: 86 Ohm
Implantable Lead Implant Date: 20200103
Implantable Lead Implant Date: 20200103
Implantable Lead Location: 753858
Implantable Lead Location: 753860
Implantable Lead Model: 4598
Implantable Lead Model: 6935
Implantable Pulse Generator Implant Date: 20200103
Lead Channel Impedance Value: 155.455
Lead Channel Impedance Value: 166.114
Lead Channel Impedance Value: 175.385
Lead Channel Impedance Value: 189.073
Lead Channel Impedance Value: 195.429
Lead Channel Impedance Value: 285 Ohm
Lead Channel Impedance Value: 323 Ohm
Lead Channel Impedance Value: 342 Ohm
Lead Channel Impedance Value: 399 Ohm
Lead Channel Impedance Value: 4047 Ohm
Lead Channel Impedance Value: 437 Ohm
Lead Channel Impedance Value: 456 Ohm
Lead Channel Impedance Value: 513 Ohm
Lead Channel Impedance Value: 551 Ohm
Lead Channel Impedance Value: 551 Ohm
Lead Channel Impedance Value: 608 Ohm
Lead Channel Impedance Value: 646 Ohm
Lead Channel Impedance Value: 703 Ohm
Lead Channel Pacing Threshold Amplitude: 0.375 V
Lead Channel Pacing Threshold Amplitude: 5 V
Lead Channel Pacing Threshold Pulse Width: 0.4 ms
Lead Channel Pacing Threshold Pulse Width: 1 ms
Lead Channel Sensing Intrinsic Amplitude: 10.75 mV
Lead Channel Sensing Intrinsic Amplitude: 10.75 mV
Lead Channel Setting Pacing Amplitude: 2.5 V
Lead Channel Setting Pacing Amplitude: 3.25 V
Lead Channel Setting Pacing Pulse Width: 0.4 ms
Lead Channel Setting Pacing Pulse Width: 1 ms
Lead Channel Setting Sensing Sensitivity: 0.3 mV

## 2020-01-27 NOTE — Telephone Encounter (Signed)
Called patient to update him on the Patient Assistance Program (PAP) from [employer] the manufacturer of [place]. Patient was informed that Care Guide will mail application to address confirmed on chart and that Dr. Bettina Gavia will have to fill out a portion of the application and can fax it in from the office in [place].  Will call patient back around 7/8 to confirm that patient has received application.

## 2020-01-27 NOTE — Progress Notes (Signed)
Electrophysiology Office Note Date: 01/27/2020  ID:  SURYA Scott, DOB [birth year], MRN [id number]  PCP: Greig Right, MD Primary Cardiologist: No primary care provider on file. Electrophysiologist: Will Meredith Leeds, MD   CC: Routine ICD follow-up  Jaime Scott is a 69 y.o. male seen today for Will Meredith Leeds, MD for acute visit due to increased LV threshold.  Since last being seen in our clinic the patient reports doing very well overall. He has SOB at baseline, on chronic O2. No intermittent worsening. .  he denies chest pain, palpitations, PND, orthopnea, nausea, vomiting, dizziness, syncope, edema, or early satiety. He has not had ICD shocks.   Device History: Medtronic BiV ICD implanted 2017, AV nodal ablation and CRT upgrade 2020 for CHF and tachy brady   Past Medical History:  Diagnosis Date   Alcohol abuse    Arthritis    CHF (congestive heart failure) ([place]) 03/12/2017   Chronic atrial fibrillation ([place]) 03/12/2017   COPD (chronic obstructive pulmonary disease) ([place]) 03/12/2017   Dyspnea    wears Oxygen 24/7 3 L   Essential hypertension 03/12/2017   Hyperlipidemia 03/12/2017   Ischemic cardiomyopathy 03/12/2017   Long term (current) use of anticoagulants 03/12/2017   Peripheral vascular disease ([place]) 03/12/2017   Presence of combination internal cardiac defibrillator (ICD) and pacemaker 03/12/2017   unable to place a CRT    Type 2 diabetes mellitus without complications ([place]) 11/03/8117   Past Surgical History:  Procedure Laterality Date   AV NODE ABLATION N/A 08/02/2018   Procedure: AV NODE ABLATION;  Surgeon: Evans Lance, MD;  Location: [place] [facility];  Service: Cardiovascular;  Laterality: N/A;   BIV ICD INSERTION CRT-D N/A 07/30/2018   Procedure: BIV ICD INSERTION CRT-D;  Surgeon: Evans Lance, MD;  Location: [facility];  Service: Cardiovascular;  Laterality: N/A;   BIV PACEMAKER GENERATOR CHANGEOUT N/A 05/27/2018   Procedure: BIV  PACEMAKER GENERATOR UPGRADE - Aborted;  Surgeon: Constance Haw, MD;  Location: [place] [facility];  Service: Cardiovascular;  Laterality: N/A;   CHOLECYSTECTOMY     COLONOSCOPY     ERCP     stone removal   EXCISION OF BREAST BIOPSY     Age 67, benign   PACEMAKER LEAD REMOVAL N/A 07/30/2018   Procedure: PACEMAKER LEAD REMOVAL;  Surgeon: Evans Lance, MD;  Location: [facility];  Service: Cardiovascular;  Laterality: N/A;    Current Outpatient Medications  Medication Sig Dispense Refill   diphenhydrAMINE (BENADRYL) 25 MG tablet Take 25 mg by mouth every 6 (six) hours as needed for allergies.      enalapril (VASOTEC) 5 MG tablet TAKE 1 TABLET BY MOUTH ONCE DAILY 90 tablet 1   furosemide (LASIX) 80 MG tablet Take 80 mg by mouth as directed. 2 tablets in the morning and 1 tablets in the evening      ipratropium (ATROVENT) 0.02 % nebulizer solution Take 0.5 mg by nebulization every 6 (six) hours as needed for wheezing or shortness of breath.     metFORMIN (GLUCOPHAGE-XR) 500 MG 24 hr tablet Take 1,000 mg by mouth 2 (two) times daily.      metoprolol succinate (TOPROL-XL) 25 MG 24 hr tablet TAKE 1 TABLET BY MOUTH ONCE DAILY WITH FOOD 90 tablet 1   OXYGEN Inhale 3 L into the lungs as needed.     potassium chloride SA (K-DUR,KLOR-CON) 20 MEQ tablet Take 20 mEq by mouth every evening.      simvastatin (ZOCOR)  20 MG tablet Take 20 mg by mouth every evening.      spironolactone (ALDACTONE) 25 MG tablet Take 12.5 mg by mouth daily.     warfarin (COUMADIN) 1 MG tablet Take 1 mg by mouth daily.      warfarin (COUMADIN) 5 MG tablet Take 5 mg by mouth daily at 8 pm.     No current facility-administered medications for this visit.    Allergies:   Ivp dye [iodinated diagnostic agents]   Social History: Social History   Socioeconomic History   Marital status: Divorced    Spouse name: Not on file   Number of children: Not on file   Years of education: Not on file   Highest  education level: Not on file  Occupational History   Not on file  Tobacco Use   Smoking status: Former Smoker    Packs/day: 1.00    Years: 61.00    Pack years: 61.00    Types: Cigarettes    Quit date: 08/2018    Years since quitting: 1.4   Smokeless tobacco: Never Used  Vaping Use   Vaping Use: Never used  Substance and Sexual Activity   Alcohol use: No   Drug use: No   Sexual activity: Not on file  Other Topics Concern   Not on file  Social History Narrative   Not on file   Social Determinants of Health   Financial Resource Strain:    Difficulty of Paying Living Expenses:   Food Insecurity:    Worried About Charity fundraiser in the Last Year:    Arboriculturist in the Last Year:   Transportation Needs:    Film/video editor (Medical):    Lack of Transportation (Non-Medical):   Physical Activity:    Days of Exercise per Week:    Minutes of Exercise per Session:   Stress:    Feeling of Stress :   Social Connections:    Frequency of Communication with Friends and Family:    Frequency of Social Gatherings with Friends and Family:    Attends Religious Services:    Active Member of Clubs or Organizations:    Attends Music therapist:    Marital Status:   Intimate Partner Violence:    Fear of Current or Ex-Partner:    Emotionally Abused:    Physically Abused:    Sexually Abused:     Family History: Family History  Problem Relation Age of Onset   Breast cancer Sister    Alcohol abuse Brother    Alcohol abuse Brother     Review of Systems: All other systems reviewed and are otherwise negative except as noted above.   Physical Exam: Vitals:   01/27/20 0947  BP: 122/70  Pulse: 82  SpO2: 98%  Weight: 233 lb (105.7 kg)  Height: 5\' 7"  (1.702 m)     GEN- The patient is well appearing, alert and oriented x 3 today.   HEENT: normocephalic, atraumatic; sclera clear, conjunctiva pink; hearing intact; oropharynx  clear; neck supple, no JVP Lymph- no cervical lymphadenopathy Lungs- Clear to ausculation bilaterally, normal work of breathing.  No wheezes, rales, rhonchi Heart- Regular rate and rhythm, no murmurs, rubs or gallops, PMI not laterally displaced GI- soft, non-tender, non-distended, bowel sounds present, no hepatosplenomegaly Extremities- no clubbing, cyanosis, or edema; DP/PT/radial pulses 2+ bilaterally MS- no significant deformity or atrophy Skin- warm and dry, no rash or lesion; ICD pocket well healed Psych- euthymic mood, full affect  Neuro- strength and sensation are intact  ICD interrogation- reviewed in detail today,  See PACEART report  EKG:  EKG is ordered today. The ekg ordered today shows shows BiV pacing at 60 bpm with QRS 164 ms, positive lead 1 and small voltage, mostly negative V1. LV from 20-60 ms first did not improve leadwise, and widened QRS  Recent Labs: 01/24/2020: BNP 175.1; BUN 10; Creatinine, Ser 0.80; Potassium 5.1; Sodium 137   Wt Readings from Last 3 Encounters:  01/27/20 233 lb (105.7 kg)  01/10/20 228 lb 3.2 oz (103.5 kg)  06/16/19 238 lb 12.8 oz (108.3 kg)     Other studies Reviewed: Additional studies/ records that were reviewed today include: Previous EP office notes, previous device checks, previous remotes.   Assessment and Plan:  1.  Chronic systolic dysfunction s/p Medtronic CRT-D  euvolemic today Stable on an appropriate medical regimen Normal ICD function See Pace Art report LV threshold elevated LV1-LV2. Reprogrammed LV 1 to RV coil with best threshold and best battery life. EKG relatively unchanged and V-V optimization was ineffective at narrowing QRS any further or making V1 more positive.   2. Obesity Encouraged lifestyle modification Body mass index is 36.49 kg/m.   3. Atrial fib He is rate controlled s/p AV nodal ablation.   Current medicines are reviewed at length with the patient today.   The patient does not have concerns  regarding his medicines.  The following changes were made today:  none  Labs/ tests ordered today include:  Orders Placed This Encounter  Procedures   CUP [place]   EKG 12-Lead    Disposition:   Follow up with Dr. Lovena Le as scheduled for call back in the fall.   Jacalyn Lefevre, PA-C  01/27/2020 10:45 AM  [facility] [address] [place] [address] [phone] (office) [phone] (fax)

## 2020-01-27 NOTE — Patient Instructions (Addendum)
Medication Instructions:  *If you need a refill on your cardiac medications before your next appointment, please call your pharmacy*  Lab Work: If you have labs (blood work) drawn today and your tests are completely normal, you will receive your results only by: Marland Kitchen MyChart Message (if you have MyChart) OR . A paper copy in the mail If you have any lab test that is abnormal or we need to change your treatment, we will call you to review the results.  Follow-Up: At [facility], you and your health needs are our priority.  As part of our continuing mission to provide you with exceptional heart care, we have created designated Provider Care Teams.  These Care Teams include your primary Cardiologist (physician) and Advanced Practice Providers (APPs -  Physician Assistants and Nurse Practitioners) who all work together to provide you with the care you need, when you need it.  We recommend signing up for the patient portal called "MyChart".  Sign up information is provided on this After Visit Summary.  MyChart is used to connect with patients for Virtual Visits (Telemedicine).  Patients are able to view lab/test results, encounter notes, upcoming appointments, etc.  Non-urgent messages can be sent to your provider as well.   To learn more about what you can do with MyChart, go to [email or web address].    Your next appointment:   Your physician recommends that you schedule a follow-up appointment on Friday Jun 01, 2020 at 2:00 pm   The format for your next appointment:   In Person with Cristopher Peru, MD

## 2020-02-01 ENCOUNTER — Other Ambulatory Visit: Payer: Self-pay

## 2020-02-01 ENCOUNTER — Ambulatory Visit: Payer: Medicare Other

## 2020-02-01 DIAGNOSIS — Z0000 Encounter for general adult medical examination without abnormal findings: Secondary | ICD-10-CM

## 2020-02-01 NOTE — Progress Notes (Signed)
Follow up with patient regarding affordability of Entresto. Patient has not received application in the mail from [place] that was printed from the manufacturer. This application is to be completed by Dr. Bettina Gavia and the patient. Will return call to patient 02/02/20 as requested by patient to determine next steps.  Link to application:  [email or web address]

## 2020-02-02 ENCOUNTER — Telehealth: Payer: Self-pay

## 2020-02-02 DIAGNOSIS — Z0000 Encounter for general adult medical examination without abnormal findings: Secondary | ICD-10-CM

## 2020-02-02 NOTE — Telephone Encounter (Signed)
Follow up with patient to ensure they received application to complete for Entresto medication assistance. Patient informed Care Guide they have not received the mail.  Care Guide informed patient they will be faxing the application to Dr. Pernell Dupre office to have him complete his portion and patient to complete theirs and fax it in from [place].  Patient understands and agreed to next steps.  Care Guide will follow up with patient next week.

## 2020-02-03 ENCOUNTER — Telehealth: Payer: Self-pay

## 2020-02-03 ENCOUNTER — Telehealth: Payer: Self-pay | Admitting: Cardiology

## 2020-02-03 DIAGNOSIS — Z0000 Encounter for general adult medical examination without abnormal findings: Secondary | ICD-10-CM

## 2020-02-03 NOTE — Telephone Encounter (Signed)
Amy is calling from [place] in regards to a fax she sent over to Dr. Bettina Gavia and Lilia Pro for an application an application on [employer] and is wanting to know if it was received. Please advise.

## 2020-02-03 NOTE — Telephone Encounter (Signed)
Spoke with Amy just now and let her know that I did receive this application for the patients entresto. I told her that I would have Dr. Bettina Gavia sign the forms on Monday when he is back in the office. She also let me know that she would call the paitent today to have him come by and pick up the forms to fill out. I told her that they are in the patient pick up box and are ready to be picked up. She verbalizes understanding and thanks me for the call back.

## 2020-02-03 NOTE — Telephone Encounter (Signed)
Called patient to inform him that the Patient Assistant Program application for Jaime Scott has been faxed and received at the [place] office where he sees Dr. Bettina Gavia. Patient is aware that he is to stop by the office to complete his portion and Dr. Bettina Gavia will complete his portion and fax in.

## 2020-02-14 ENCOUNTER — Telehealth: Payer: Self-pay

## 2020-02-14 NOTE — Telephone Encounter (Signed)
Left message on patients voicemail to please return our call in regards to his patient assistance forms.

## 2020-02-15 ENCOUNTER — Telehealth: Payer: Self-pay | Admitting: Cardiology

## 2020-02-15 NOTE — Telephone Encounter (Signed)
Spoke to the patient just now and let him know that the patient assistance foundation needs proof of his income and his insurance. He states that he will get these documents for me and will bring them in tomorrow or Friday.    Encouraged patient to call back with any questions or concerns.

## 2020-02-15 NOTE — Telephone Encounter (Signed)
Left message on patients voicemail to please return our call.   

## 2020-02-15 NOTE — Telephone Encounter (Signed)
Pt is returning phone call from yesterday. Please call back

## 2020-03-05 DIAGNOSIS — Z7901 Long term (current) use of anticoagulants: Secondary | ICD-10-CM | POA: Diagnosis not present

## 2020-03-12 DIAGNOSIS — Z7901 Long term (current) use of anticoagulants: Secondary | ICD-10-CM | POA: Diagnosis not present

## 2020-03-12 DIAGNOSIS — E669 Obesity, unspecified: Secondary | ICD-10-CM | POA: Diagnosis not present

## 2020-03-12 DIAGNOSIS — E7800 Pure hypercholesterolemia, unspecified: Secondary | ICD-10-CM | POA: Diagnosis not present

## 2020-03-12 DIAGNOSIS — Z6837 Body mass index (BMI) 37.0-37.9, adult: Secondary | ICD-10-CM | POA: Diagnosis not present

## 2020-03-12 DIAGNOSIS — I10 Essential (primary) hypertension: Secondary | ICD-10-CM | POA: Diagnosis not present

## 2020-03-12 DIAGNOSIS — J9611 Chronic respiratory failure with hypoxia: Secondary | ICD-10-CM | POA: Diagnosis not present

## 2020-03-12 DIAGNOSIS — I5022 Chronic systolic (congestive) heart failure: Secondary | ICD-10-CM | POA: Diagnosis not present

## 2020-03-12 DIAGNOSIS — E1169 Type 2 diabetes mellitus with other specified complication: Secondary | ICD-10-CM | POA: Diagnosis not present

## 2020-03-12 DIAGNOSIS — J449 Chronic obstructive pulmonary disease, unspecified: Secondary | ICD-10-CM | POA: Diagnosis not present

## 2020-03-21 ENCOUNTER — Ambulatory Visit (INDEPENDENT_AMBULATORY_CARE_PROVIDER_SITE_OTHER): Payer: Medicare Other | Admitting: *Deleted

## 2020-03-21 DIAGNOSIS — I255 Ischemic cardiomyopathy: Secondary | ICD-10-CM | POA: Diagnosis not present

## 2020-03-21 LAB — CUP PACEART REMOTE DEVICE CHECK
Battery Remaining Longevity: 36 mo
Battery Voltage: 2.96 V
Brady Statistic AP VP Percent: 0 %
Brady Statistic AP VS Percent: 0 %
Brady Statistic AS VP Percent: 0 %
Brady Statistic AS VS Percent: 0 %
Brady Statistic RA Percent Paced: 0 %
Brady Statistic RV Percent Paced: 99.82 %
Date Time Interrogation Session: 20210825022601
HighPow Impedance: 89 Ohm
Implantable Lead Implant Date: 20200103
Implantable Lead Implant Date: 20200103
Implantable Lead Location: 753858
Implantable Lead Location: 753860
Implantable Lead Model: 4598
Implantable Lead Model: 6935
Implantable Pulse Generator Implant Date: 20200103
Lead Channel Impedance Value: 155.455
Lead Channel Impedance Value: 155.455
Lead Channel Impedance Value: 166.25 Ohm
Lead Channel Impedance Value: 166.25 Ohm
Lead Channel Impedance Value: 184.154
Lead Channel Impedance Value: 285 Ohm
Lead Channel Impedance Value: 285 Ohm
Lead Channel Impedance Value: 342 Ohm
Lead Channel Impedance Value: 399 Ohm
Lead Channel Impedance Value: 399 Ohm
Lead Channel Impedance Value: 4047 Ohm
Lead Channel Impedance Value: 437 Ohm
Lead Channel Impedance Value: 513 Ohm
Lead Channel Impedance Value: 513 Ohm
Lead Channel Impedance Value: 551 Ohm
Lead Channel Impedance Value: 570 Ohm
Lead Channel Impedance Value: 608 Ohm
Lead Channel Impedance Value: 722 Ohm
Lead Channel Pacing Threshold Amplitude: 0.5 V
Lead Channel Pacing Threshold Amplitude: 3 V
Lead Channel Pacing Threshold Pulse Width: 0.4 ms
Lead Channel Pacing Threshold Pulse Width: 1 ms
Lead Channel Sensing Intrinsic Amplitude: 10.125 mV
Lead Channel Sensing Intrinsic Amplitude: 10.125 mV
Lead Channel Setting Pacing Amplitude: 2.5 V
Lead Channel Setting Pacing Amplitude: 3.25 V
Lead Channel Setting Pacing Pulse Width: 0.4 ms
Lead Channel Setting Pacing Pulse Width: 1 ms
Lead Channel Setting Sensing Sensitivity: 0.3 mV

## 2020-03-22 ENCOUNTER — Telehealth: Payer: Self-pay | Admitting: Cardiology

## 2020-03-22 NOTE — Telephone Encounter (Signed)
Reviewed transmission. No findings noted.

## 2020-03-22 NOTE — Telephone Encounter (Signed)
Patient states he did his remote defib check yesterday 8/25 but hasn't heard anything back yet regarding that. Please call.

## 2020-03-22 NOTE — Telephone Encounter (Signed)
I called the pt per the nurse request to let him know that the transmission was normal. I also let him know we do not call when the transmission is normal.

## 2020-03-26 NOTE — Progress Notes (Signed)
Remote ICD transmission.   

## 2020-04-11 DIAGNOSIS — Z7901 Long term (current) use of anticoagulants: Secondary | ICD-10-CM | POA: Diagnosis not present

## 2020-04-13 DIAGNOSIS — Z7901 Long term (current) use of anticoagulants: Secondary | ICD-10-CM | POA: Diagnosis not present

## 2020-04-16 DIAGNOSIS — M1990 Unspecified osteoarthritis, unspecified site: Secondary | ICD-10-CM | POA: Insufficient documentation

## 2020-04-16 DIAGNOSIS — F1010 Alcohol abuse, uncomplicated: Secondary | ICD-10-CM | POA: Insufficient documentation

## 2020-04-16 DIAGNOSIS — R0600 Dyspnea, unspecified: Secondary | ICD-10-CM | POA: Insufficient documentation

## 2020-04-17 ENCOUNTER — Other Ambulatory Visit: Payer: Self-pay

## 2020-04-17 ENCOUNTER — Ambulatory Visit (INDEPENDENT_AMBULATORY_CARE_PROVIDER_SITE_OTHER): Payer: Medicare Other | Admitting: Cardiology

## 2020-04-17 ENCOUNTER — Encounter: Payer: Self-pay | Admitting: Cardiology

## 2020-04-17 VITALS — BP 99/65 | HR 64 | Ht 67.0 in | Wt 235.0 lb

## 2020-04-17 DIAGNOSIS — I447 Left bundle-branch block, unspecified: Secondary | ICD-10-CM

## 2020-04-17 DIAGNOSIS — N644 Mastodynia: Secondary | ICD-10-CM | POA: Diagnosis not present

## 2020-04-17 DIAGNOSIS — Z95810 Presence of automatic (implantable) cardiac defibrillator: Secondary | ICD-10-CM | POA: Diagnosis not present

## 2020-04-17 DIAGNOSIS — I110 Hypertensive heart disease with heart failure: Secondary | ICD-10-CM | POA: Diagnosis not present

## 2020-04-17 DIAGNOSIS — E782 Mixed hyperlipidemia: Secondary | ICD-10-CM | POA: Diagnosis not present

## 2020-04-17 DIAGNOSIS — I5022 Chronic systolic (congestive) heart failure: Secondary | ICD-10-CM

## 2020-04-17 DIAGNOSIS — Z7901 Long term (current) use of anticoagulants: Secondary | ICD-10-CM | POA: Diagnosis not present

## 2020-04-17 MED ORDER — EPLERENONE 25 MG PO TABS: 25.0000 mg | ORAL_TABLET | Freq: Every day | ORAL | 3 refills | Status: DC

## 2020-04-17 NOTE — Patient Instructions (Signed)
Medication Instructions:  1) Stop Spironolactone   2) Start Inspra 25 mg daily (wait 2 weeks before starting)   *If you need a refill on your cardiac medications before your next appointment, please call your pharmacy*   Lab Work: Troponin, Bmp, Pro Bnp- Today   If you have labs (blood work) drawn today and your tests are completely normal, you will receive your results only by: Marland Kitchen MyChart Message (if you have MyChart) OR . A paper copy in the mail If you have any lab test that is abnormal or we need to change your treatment, we will call you to review the results.   Testing/Procedures: None ordered    Follow-Up: At [facility], you and your health needs are our priority.  As part of our continuing mission to provide you with exceptional heart care, we have created designated Provider Care Teams.  These Care Teams include your primary Cardiologist (physician) and Advanced Practice Providers (APPs -  Physician Assistants and Nurse Practitioners) who all work together to provide you with the care you need, when you need it.  We recommend signing up for the patient portal called "MyChart".  Sign up information is provided on this After Visit Summary.  MyChart is used to connect with patients for Virtual Visits (Telemedicine).  Patients are able to view lab/test results, encounter notes, upcoming appointments, etc.  Non-urgent messages can be sent to your provider as well.   To learn more about what you can do with MyChart, go to [email or web address].    Your next appointment:   6 month(s)  The format for your next appointment:   In Person  Provider:   Shirlee More, MD   Other Instructions None

## 2020-04-17 NOTE — Progress Notes (Signed)
Cardiology Office Note:    Date:  04/17/2020   ID:  Jaime Scott, DOB April 22, 1951, MRN [id number]  PCP:  Greig Right, MD  Cardiologist:  Shirlee More, MD    Referring MD: Greig Right, MD    ASSESSMENT:    1. Chronic systolic heart failure ([place])   2. LBBB (left bundle branch block)   3. ICD (implantable cardioverter-defibrillator) in place   4. Long term (current) use of anticoagulants   5. Hypertensive heart disease with chronic systolic congestive heart failure ([place])   6. Mixed hyperlipidemia   7. Breast tenderness in male    PLAN:    In order of problems listed above:  1. Ejection fraction remains severely reduced he should be on Entresto cannot afford it and will look into see what the delay is in approval from the company for assistance and continue current treatment including ACE inhibitor recheck renal function proBNP 2. Chest pain is very atypical reproducible musculoskeletal he is 100% ventricularly paced check a high-sensitivity troponin. 3. Stable ICD function 4. Continue statin lipids are at target 5. Is long history of breast enlargement breast tenderness is a young man he had surgery for gynecomastia breast tenderness with digoxin now with spironolactone have discontinue wait 2 weeks and switch to a different MRA   Next appointment: Next months   Medication Adjustments/Labs and Tests Ordered: Current medicines are reviewed at length with the patient today.  Concerns regarding medicines are outlined above.  No orders of the defined types were placed in this encounter.  No orders of the defined types were placed in this encounter.   Chief Complaint  Patient presents with  . Follow-up  . Congestive Heart Failure    History of Present Illness:    Jaime Scott is a 69 y.o. male with a hx of heart failure, LBBB, nonischemic cardiomyopathy and EF 25% MUGA 03/21/17 , ICD with failed attempt at placing left ventricular pacing lead for cardiac  resynchronization. Implantable defibrillator in place with backup ventricular pacing available if required, hypertension, and chronic atrial fibrillation  last seen 01/10/2020.  Compliance with diet, lifestyle and medications: Yes  He has several concerns the first is assistance for Entresto with only the forms from her office staff.  Second is breast tenderness and sounds like in the past he had enlargement digoxin he takes spironolactone and complains of tenderness of his right breast.  Wait 2 weeks and put him on a different MRA.  His next complaint is chest pain focal reproducible left sternal border last EKG is 100% ventricularly paced.  Really go ahead and check a high-sensitivity troponin in the office today I do not think he needs a coronary angiogram.  Overall he is not doing well he is weak and he has shortness of breath with activity.  No edema orthopnea palpitation or syncope.  Echocardiogram 01/15/2020 shows severe LV dysfunction EF 35 to 40% with mild concentric LVH and his BNP level remains relatively low. Past Medical History:  Diagnosis Date  . Alcohol abuse   . Arthritis   . Atrial fibrillation ([place]) 08/02/2018  . CHF (congestive heart failure) ([place]) 03/12/2017  . Chronic atrial fibrillation ([place]) 03/12/2017  . Chronic respiratory failure ([place])   . COPD (chronic obstructive pulmonary disease) ([place]) 03/12/2017  . Dyspnea    wears Oxygen 24/7 3 L  . Essential hypertension 03/12/2017  . High risk medication use 05/05/2017  . Hyperlipidemia 03/12/2017  . Hypertensive heart disease with heart failure ([place]) 03/12/2017  .  ICD (implantable cardioverter-defibrillator) in place 07/30/2018  . Ischemic cardiomyopathy 03/12/2017  . Long term (current) use of anticoagulants 03/12/2017  . Peripheral vascular disease ([place]) 03/12/2017  . Preoperative cardiovascular examination 03/25/2015  . Presence of combination internal cardiac defibrillator (ICD) and pacemaker 03/12/2017   unable to place a CRT     . Type 2 diabetes mellitus without complications ([place]) 9/79/4801    Past Surgical History:  Procedure Laterality Date  . AV NODE ABLATION N/A 08/02/2018   Procedure: AV NODE ABLATION;  Surgeon: Evans Lance, MD;  Location: [place] [facility];  Service: Cardiovascular;  Laterality: N/A;  . BIV ICD INSERTION CRT-D N/A 07/30/2018   Procedure: BIV ICD INSERTION CRT-D;  Surgeon: Evans Lance, MD;  Location: [place];  Service: Cardiovascular;  Laterality: N/A;  . [place] N/A 05/27/2018   Procedure: BIV PACEMAKER GENERATOR UPGRADE - Aborted;  Surgeon: Constance Haw, MD;  Location: [place] [facility];  Service: Cardiovascular;  Laterality: N/A;  . CHOLECYSTECTOMY    . COLONOSCOPY    . ERCP     stone removal  . EXCISION OF BREAST BIOPSY     Age 53, benign  . PACEMAKER LEAD REMOVAL N/A 07/30/2018   Procedure: PACEMAKER LEAD REMOVAL;  Surgeon: Evans Lance, MD;  [facility];  Service: Cardiovascular;  Laterality: N/A;    Current Medications: Current Meds  Medication Sig  . diphenhydrAMINE (BENADRYL) 25 MG tablet Take 25 mg by mouth every 6 (six) hours as needed for allergies.   Marland Kitchen enalapril (VASOTEC) 5 MG tablet TAKE 1 TABLET BY MOUTH ONCE DAILY  . furosemide (LASIX) 80 MG tablet Take 80 mg by mouth as directed. 2 tablets in the morning and 1 tablets in the evening   . ipratropium (ATROVENT) 0.02 % nebulizer solution Take 0.5 mg by nebulization every 6 (six) hours as needed for wheezing or shortness of breath.  . metFORMIN (GLUCOPHAGE-XR) 500 MG 24 hr tablet Take 1,000 mg by mouth 2 (two) times daily.   . metoprolol succinate (TOPROL-XL) 25 MG 24 hr tablet TAKE 1 TABLET BY MOUTH ONCE DAILY WITH FOOD  . OXYGEN Inhale 3 L into the lungs as needed.  . potassium chloride SA (K-DUR,KLOR-CON) 20 MEQ tablet Take 20 mEq by mouth every evening.   . simvastatin (ZOCOR) 20 MG tablet Take 20 mg by mouth every evening.   Marland Kitchen spironolactone (ALDACTONE) 25 MG tablet Take  12.5 mg by mouth 2 (two) times daily.   Marland Kitchen warfarin (COUMADIN) 1 MG tablet Take 1 mg by mouth daily.   Marland Kitchen warfarin (COUMADIN) 5 MG tablet Take 5 mg by mouth daily at 8 pm.     Allergies:   Ivp dye [iodinated diagnostic agents]   Social History   Socioeconomic History  . Marital status: Divorced    Spouse name: Not on file  . Number of children: Not on file  . Years of education: Not on file  . Highest education level: Not on file  Occupational History  . Not on file  Tobacco Use  . Smoking status: Former Smoker    Packs/day: 1.00    Years: 61.00    Pack years: 61.00    Types: Cigarettes    Quit date: 08/2018    Years since quitting: 1.6  . Smokeless tobacco: Never Used  Vaping Use  . Vaping Use: Never used  Substance and Sexual Activity  . Alcohol use: No  . Drug use: No  . Sexual activity:  Not on file  Other Topics Concern  . Not on file  Social History Narrative  . Not on file   Social Determinants of Health   Financial Resource Strain:   . Difficulty of Paying Living Expenses: Not on file  Food Insecurity:   . Worried About Charity fundraiser in the Last Year: Not on file  . Ran Out of Food in the Last Year: Not on file  Transportation Needs:   . Lack of Transportation (Medical): Not on file  . Lack of Transportation (Non-Medical): Not on file  Physical Activity:   . Days of Exercise per Week: Not on file  . Minutes of Exercise per Session: Not on file  Stress:   . Feeling of Stress : Not on file  Social Connections:   . Frequency of Communication with Friends and Family: Not on file  . Frequency of Social Gatherings with Friends and Family: Not on file  . Attends Religious Services: Not on file  . Active Member of Clubs or Organizations: Not on file  . Attends Archivist Meetings: Not on file  . Marital Status: Not on file     Family History: The patient's family history includes Alcohol abuse in his brother and brother; Breast cancer in his  sister. ROS:   Please see the history of present illness.    All other systems reviewed and are negative.  EKGs/Labs/Other Studies Reviewed:    The following studies were reviewed today:   Recent Labs:  12/14/2019 cholesterol 151 LDL 80 triglycerides 132 HDL 48 A1c 7.0% 01/24/2020: BNP 175.1; BUN 10; Creatinine, Ser 0.80; Potassium 5.1; Sodium 137  Recent Lipid Panel No results found for: CHOL, TRIG, HDL, CHOLHDL, VLDL, LDLCALC, LDLDIRECT  Physical Exam:    VS:  BP 99/65   Pulse 64   Ht 5\' 7"  (1.702 m)   Wt 235 lb (106.6 kg)   SpO2 98%   BMI 36.81 kg/m     Wt Readings from Last 3 Encounters:  04/17/20 235 lb (106.6 kg)  01/27/20 233 lb (105.7 kg)  01/10/20 228 lb 3.2 oz (103.5 kg)     GEN:  Well nourished, well developed in no acute distress HEENT: Normal NECK: No JVD; No carotid bruits LYMPHATICS: No lymphadenopathy CARDIAC: RRR, no murmurs, rubs, gallops under left sternal border multiple cc J reproduces his symptoms tenderness left and right breast no mass RESPIRATORY:  Clear to auscultation without rales, wheezing or rhonchi  ABDOMEN: Soft, non-tender, non-distended MUSCULOSKELETAL:  No edema; No deformity  SKIN: Warm and dry NEUROLOGIC:  Alert and oriented x 3 PSYCHIATRIC:  Normal affect    Signed, Shirlee More, MD  04/17/2020 1:11 PM    [place]

## 2020-04-17 NOTE — Addendum Note (Signed)
Addended by: Mendel Ryder on: 04/17/2020 01:24 PM   Modules accepted: Orders

## 2020-04-18 LAB — BASIC METABOLIC PANEL
BUN/Creatinine Ratio: 11 (ref 10–24)
BUN: 9 mg/dL (ref 8–27)
CO2: 32 mmol/L — ABNORMAL HIGH (ref 20–29)
Calcium: 9.6 mg/dL (ref 8.6–10.2)
Chloride: 94 mmol/L — ABNORMAL LOW (ref 96–106)
Creatinine, Ser: 0.82 mg/dL (ref 0.76–1.27)
GFR calc Af Amer: 104 mL/min/{1.73_m2} (ref >59)
GFR calc non Af Amer: 90 mL/min/{1.73_m2} (ref >59)
Glucose: 118 mg/dL — ABNORMAL HIGH (ref 65–99)
Potassium: 5.1 mmol/L (ref 3.5–5.2)
Sodium: 138 mmol/L (ref 134–144)

## 2020-04-18 LAB — PRO B NATRIURETIC PEPTIDE: NT-Pro BNP: 1072 pg/mL — ABNORMAL HIGH (ref 0–376)

## 2020-04-18 LAB — TROPONIN T: Troponin T (Highly Sensitive): 15 ng/L (ref 0–22)

## 2020-04-24 ENCOUNTER — Telehealth: Payer: Self-pay | Admitting: Cardiology

## 2020-04-24 MED ORDER — ENTRESTO 49-51 MG PO TABS: 1.0000 | ORAL_TABLET | Freq: Two times a day (BID) | ORAL | 3 refills | Status: DC

## 2020-04-24 NOTE — Telephone Encounter (Signed)
Left message on patients voicemail to please return our call.   

## 2020-04-24 NOTE — Telephone Encounter (Signed)
Fu   Pt called in , gave below info per Lilia Pro :)  Have him call the patient assistance foundation that he applied for then. I believe he applied for [employer]. Their number is [phone]-

## 2020-04-24 NOTE — Telephone Encounter (Signed)
Spoke to patient just now and let him know that he needs to discontinue his enalapril for 3 days prior to starting his new Entresto medication that has been called in for him. He verbalizes understanding and thanks me for the call back.    Encouraged patient to call back with any questions or concerns.

## 2020-04-24 NOTE — Telephone Encounter (Signed)
Patient is calling stating he medication is being switch he wants to know if he needs to wait before staring the new medication.

## 2020-04-24 NOTE — Telephone Encounter (Signed)
Called patient. He reports he got a call from [employer] saying he was approved for something he thought entresto. I do not see this med in his chart or any docuemntation of this will check with Lilia Pro. Patent wanted to know if what he got approved for was for 1 month or 1 year.

## 2020-05-09 DIAGNOSIS — Z7901 Long term (current) use of anticoagulants: Secondary | ICD-10-CM | POA: Diagnosis not present

## 2020-05-10 DIAGNOSIS — Z7901 Long term (current) use of anticoagulants: Secondary | ICD-10-CM | POA: Diagnosis not present

## 2020-05-18 DIAGNOSIS — H15101 Unspecified episcleritis, right eye: Secondary | ICD-10-CM | POA: Diagnosis not present

## 2020-05-28 DIAGNOSIS — H15101 Unspecified episcleritis, right eye: Secondary | ICD-10-CM | POA: Diagnosis not present

## 2020-05-28 DIAGNOSIS — H524 Presbyopia: Secondary | ICD-10-CM | POA: Diagnosis not present

## 2020-06-01 ENCOUNTER — Encounter: Payer: Self-pay | Admitting: Internal Medicine

## 2020-06-01 ENCOUNTER — Ambulatory Visit (INDEPENDENT_AMBULATORY_CARE_PROVIDER_SITE_OTHER): Payer: Medicare Other | Admitting: Internal Medicine

## 2020-06-01 ENCOUNTER — Other Ambulatory Visit: Payer: Self-pay

## 2020-06-01 VITALS — BP 102/62 | HR 72 | Ht 67.0 in | Wt 239.8 lb

## 2020-06-01 DIAGNOSIS — I5022 Chronic systolic (congestive) heart failure: Secondary | ICD-10-CM | POA: Diagnosis not present

## 2020-06-01 DIAGNOSIS — I420 Dilated cardiomyopathy: Secondary | ICD-10-CM

## 2020-06-01 DIAGNOSIS — I482 Chronic atrial fibrillation, unspecified: Secondary | ICD-10-CM | POA: Diagnosis not present

## 2020-06-01 DIAGNOSIS — I255 Ischemic cardiomyopathy: Secondary | ICD-10-CM

## 2020-06-01 DIAGNOSIS — Z95810 Presence of automatic (implantable) cardiac defibrillator: Secondary | ICD-10-CM | POA: Diagnosis not present

## 2020-06-01 DIAGNOSIS — I447 Left bundle-branch block, unspecified: Secondary | ICD-10-CM | POA: Diagnosis not present

## 2020-06-01 LAB — CUP PACEART INCLINIC DEVICE CHECK
Battery Remaining Longevity: 37 mo
Battery Voltage: 2.95 V
Brady Statistic AP VP Percent: 0 %
Brady Statistic AP VS Percent: 0 %
Brady Statistic AS VP Percent: 0 %
Brady Statistic AS VS Percent: 0 %
Brady Statistic RA Percent Paced: 0 %
Brady Statistic RV Percent Paced: 99.85 %
Date Time Interrogation Session: 20211105150528
HighPow Impedance: 92 Ohm
Implantable Lead Implant Date: 20200103
Implantable Lead Implant Date: 20200103
Implantable Lead Location: 753858
Implantable Lead Location: 753860
Implantable Lead Model: 4598
Implantable Lead Model: 6935
Implantable Pulse Generator Implant Date: 20200103
Lead Channel Impedance Value: 149.625
Lead Channel Impedance Value: 155.455
Lead Channel Impedance Value: 168 Ohm
Lead Channel Impedance Value: 175.385
Lead Channel Impedance Value: 195.429
Lead Channel Impedance Value: 266 Ohm
Lead Channel Impedance Value: 285 Ohm
Lead Channel Impedance Value: 342 Ohm
Lead Channel Impedance Value: 380 Ohm
Lead Channel Impedance Value: 4047 Ohm
Lead Channel Impedance Value: 456 Ohm
Lead Channel Impedance Value: 456 Ohm
Lead Channel Impedance Value: 513 Ohm
Lead Channel Impedance Value: 551 Ohm
Lead Channel Impedance Value: 551 Ohm
Lead Channel Impedance Value: 551 Ohm
Lead Channel Impedance Value: 570 Ohm
Lead Channel Impedance Value: 608 Ohm
Lead Channel Pacing Threshold Amplitude: 0.5 V
Lead Channel Pacing Threshold Amplitude: 3.25 V
Lead Channel Pacing Threshold Pulse Width: 0.4 ms
Lead Channel Pacing Threshold Pulse Width: 1 ms
Lead Channel Sensing Intrinsic Amplitude: 14.75 mV
Lead Channel Sensing Intrinsic Amplitude: 14.75 mV
Lead Channel Setting Pacing Amplitude: 2.5 V
Lead Channel Setting Pacing Amplitude: 3.25 V
Lead Channel Setting Pacing Pulse Width: 0.4 ms
Lead Channel Setting Pacing Pulse Width: 1 ms
Lead Channel Setting Sensing Sensitivity: 0.3 mV

## 2020-06-01 NOTE — Patient Instructions (Signed)
Medication Instructions:  Your physician recommends that you continue on your current medications as directed. Please refer to the Current Medication list given to you today.  Labwork: None ordered.  Testing/Procedures: None ordered.  Follow-Up: Your physician wants you to follow-up in: one year with Dr. Lovena Le.   You will receive a reminder letter in the mail two months in advance. If you don't receive a letter, please call our office to schedule the follow-up appointment.  Remote monitoring is used to monitor your ICD from home. This monitoring reduces the number of office visits required to check your device to one time per year. It allows [place] to keep an eye on the functioning of your device to ensure it is working properly. You are scheduled for a device check from home on 06/20/2020. You may send your transmission at any time that day. If you have a wireless device, the transmission will be sent automatically. After your physician reviews your transmission, you will receive a postcard with your next transmission date.  Any Other Special Instructions Will Be Listed Below (If Applicable).  If you need a refill on your cardiac medications before your next appointment, please call your pharmacy.

## 2020-06-01 NOTE — Progress Notes (Signed)
HPI He is a 69 yo man with persistent atrial fib and tachybrady s/p PPM insertion who developed worsening CHF symptoms associated with uncontrolled atrial fib and underwent AV node ablation. He has stopped smoking but since last being seen in our clinic, gained over 20 lbs.thoung none since his last visit. Today, hedenies symptoms of palpitations, chest pain, shortness of breath, lower extremity edema, dizziness, presyncope, or syncope. He admits to being sedentary Allergies  Allergen Reactions  . Ivp Dye [Iodinated Diagnostic Agents] Other (See Comments)    Constipation x 7 days     Current Outpatient Medications  Medication Sig Dispense Refill  . diphenhydrAMINE (BENADRYL) 25 MG tablet Take 25 mg by mouth every 6 (six) hours as needed for allergies.     Marland Kitchen eplerenone (INSPRA) 25 MG tablet Take 1 tablet (25 mg total) by mouth daily. 90 tablet 3  . furosemide (LASIX) 80 MG tablet Take 80 mg by mouth as directed. 2 tablets in the morning and 1 tablets in the evening     . ipratropium (ATROVENT) 0.02 % nebulizer solution Take 0.5 mg by nebulization every 6 (six) hours as needed for wheezing or shortness of breath.    . metFORMIN (GLUCOPHAGE-XR) 500 MG 24 hr tablet Take 1,000 mg by mouth 2 (two) times daily.     . metoprolol succinate (TOPROL-XL) 25 MG 24 hr tablet TAKE 1 TABLET BY MOUTH ONCE DAILY WITH FOOD 90 tablet 1  . OXYGEN Inhale 3 L into the lungs as needed.    . potassium chloride SA (K-DUR,KLOR-CON) 20 MEQ tablet Take 20 mEq by mouth every evening.     . sacubitril-valsartan (ENTRESTO) 49-51 MG Take 1 tablet by mouth 2 (two) times daily. 180 tablet 3  . simvastatin (ZOCOR) 20 MG tablet Take 20 mg by mouth every evening.     . warfarin (COUMADIN) 1 MG tablet Take 1 mg by mouth daily.     Marland Kitchen warfarin (COUMADIN) 5 MG tablet Take 5 mg by mouth daily at 8 pm.     No current facility-administered medications for this visit.     Past Medical History:  Diagnosis Date  .  Alcohol abuse   . Arthritis   . Atrial fibrillation ([place]) 08/02/2018  . CHF (congestive heart failure) ([place]) 03/12/2017  . Chronic atrial fibrillation ([place]) 03/12/2017  . Chronic respiratory failure ([place])   . COPD (chronic obstructive pulmonary disease) ([place]) 03/12/2017  . Dyspnea    wears Oxygen 24/7 3 L  . Essential hypertension 03/12/2017  . High risk medication use 05/05/2017  . Hyperlipidemia 03/12/2017  . Hypertensive heart disease with heart failure ([place]) 03/12/2017  . ICD (implantable cardioverter-defibrillator) in place 07/30/2018  . Ischemic cardiomyopathy 03/12/2017  . Long term (current) use of anticoagulants 03/12/2017  . Peripheral vascular disease ([place]) 03/12/2017  . Preoperative cardiovascular examination 03/25/2015  . 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 Type 2 diabetes mellitus without complications ([place]) 6/64/4034    ROS:   All systems reviewed and negative except as noted in the HPI.   Past Surgical History:  Procedure Laterality Date  . AV NODE ABLATION N/A 08/02/2018   Procedure: AV NODE ABLATION;  Surgeon: Evans Lance, MD;  Location: [place] [facility];  Service: Cardiovascular;  Laterality: N/A;  . BIV ICD INSERTION CRT-D N/A 07/30/2018   Procedure: BIV ICD INSERTION CRT-D;  Surgeon: Evans Lance, MD;  Location:  [place] [facility];  Service: Cardiovascular;  Laterality: N/A;  . BIV PACEMAKER GENERATOR CHANGEOUT N/A 05/27/2018   Procedure: BIV PACEMAKER GENERATOR UPGRADE - Aborted;  Surgeon: Constance Haw, MD;  Location: [place] [facility];  Service: Cardiovascular;  Laterality: N/A;  . CHOLECYSTECTOMY    . COLONOSCOPY    . ERCP     stone removal  . EXCISION OF BREAST BIOPSY     Age 64, benign  . PACEMAKER LEAD REMOVAL N/A 07/30/2018   Procedure: PACEMAKER LEAD REMOVAL;  Surgeon: Evans Lance, MD;  Location: [facility] OR;  Service: Cardiovascular;  Laterality: N/A;     Family History  Problem  Relation Age of Onset  . Breast cancer Sister   . Alcohol abuse Brother   . Alcohol abuse Brother      Social History   Socioeconomic History  . Marital status: Divorced    Spouse name: Not on file  . Number of children: Not on file  . Years of education: Not on file  . Highest education level: Not on file  Occupational History  . Not on file  Tobacco Use  . Smoking status: Former Smoker    Packs/day: 1.00    Years: 61.00    Pack years: 61.00    Types: Cigarettes    Quit date: 08/2018    Years since quitting: 1.7  . Smokeless tobacco: Never Used  Vaping Use  . Vaping Use: Never used  Substance and Sexual Activity  . Alcohol use: No  . Drug use: No  . Sexual activity: Not on file  Other Topics Concern  . Not on file  Social History Narrative  . Not on file   Social Determinants of Health   Financial Resource Strain:   . Difficulty of Paying Living Expenses: Not on file  Food Insecurity:   . Worried About Charity fundraiser in the Last Year: Not on file  . Ran Out of Food in the Last Year: Not on file  Transportation Needs:   . Lack of Transportation (Medical): Not on file  . Lack of Transportation (Non-Medical): Not on file  Physical Activity:   . Days of Exercise per Week: Not on file  . Minutes of Exercise per Session: Not on file  Stress:   . Feeling of Stress : Not on file  Social Connections:   . Frequency of Communication with Friends and Family: Not on file  . Frequency of Social Gatherings with Friends and Family: Not on file  . Attends Religious Services: Not on file  . Active Member of Clubs or Organizations: Not on file  . Attends Archivist Meetings: Not on file  . Marital Status: Not on file  Intimate Partner Violence:   . Fear of Current or Ex-Partner: Not on file  . Emotionally Abused: Not on file  . Physically Abused: Not on file  . Sexually Abused: Not on file     BP 102/62   Pulse 72   Ht 5\' 7"  (1.702 m)   Wt 239 lb  12.8 oz (108.8 kg)   SpO2 97%   BMI 37.56 kg/m   Physical Exam:  Well appearing NAD HEENT: Unremarkable Neck:  No JVD, no thyromegally Lymphatics:  No adenopathy Back:  No CVA tenderness Lungs:  Clear with no wheezes HEART:  Regular rate rhythm, no murmurs, no rubs, no clicks Abd:  soft, positive bowel sounds, no organomegally, no rebound, no guarding Ext:  2 plus pulses, no edema, no cyanosis,  no clubbing Skin:  No rashes no nodules Neuro:  CN II through XII intact, motor grossly intact   DEVICE  Normal device function.  See PaceArt for details.   Assess/Plan: 1. Atrial fib - his VR is well controlled. He will continue his current meds. 2. Coags - he will continue warfarin 3. HTN -his bp is well controlled. 4. Dyslipidemia - he will continue simvastatin.  Carleene Overlie Keyshon Stein,MD

## 2020-06-07 DIAGNOSIS — Z7901 Long term (current) use of anticoagulants: Secondary | ICD-10-CM | POA: Diagnosis not present

## 2020-06-07 DIAGNOSIS — J449 Chronic obstructive pulmonary disease, unspecified: Secondary | ICD-10-CM | POA: Diagnosis not present

## 2020-06-07 DIAGNOSIS — E669 Obesity, unspecified: Secondary | ICD-10-CM | POA: Diagnosis not present

## 2020-06-07 DIAGNOSIS — Z23 Encounter for immunization: Secondary | ICD-10-CM | POA: Diagnosis not present

## 2020-06-07 DIAGNOSIS — Z0000 Encounter for general adult medical examination without abnormal findings: Secondary | ICD-10-CM | POA: Diagnosis not present

## 2020-06-07 DIAGNOSIS — E1169 Type 2 diabetes mellitus with other specified complication: Secondary | ICD-10-CM | POA: Diagnosis not present

## 2020-06-07 DIAGNOSIS — E7800 Pure hypercholesterolemia, unspecified: Secondary | ICD-10-CM | POA: Diagnosis not present

## 2020-06-07 DIAGNOSIS — I4891 Unspecified atrial fibrillation: Secondary | ICD-10-CM | POA: Diagnosis not present

## 2020-06-07 DIAGNOSIS — G4733 Obstructive sleep apnea (adult) (pediatric): Secondary | ICD-10-CM | POA: Diagnosis not present

## 2020-06-07 DIAGNOSIS — I10 Essential (primary) hypertension: Secondary | ICD-10-CM | POA: Diagnosis not present

## 2020-06-07 DIAGNOSIS — J9611 Chronic respiratory failure with hypoxia: Secondary | ICD-10-CM | POA: Diagnosis not present

## 2020-06-07 DIAGNOSIS — Z6838 Body mass index (BMI) 38.0-38.9, adult: Secondary | ICD-10-CM | POA: Diagnosis not present

## 2020-06-18 ENCOUNTER — Other Ambulatory Visit: Payer: Self-pay | Admitting: Cardiology

## 2020-06-20 ENCOUNTER — Ambulatory Visit (INDEPENDENT_AMBULATORY_CARE_PROVIDER_SITE_OTHER): Payer: Medicare Other

## 2020-06-20 DIAGNOSIS — I420 Dilated cardiomyopathy: Secondary | ICD-10-CM | POA: Diagnosis not present

## 2020-06-20 LAB — CUP PACEART REMOTE DEVICE CHECK
Battery Remaining Longevity: 33 mo
Battery Voltage: 2.95 V
Brady Statistic AP VP Percent: 0 %
Brady Statistic AP VS Percent: 0 %
Brady Statistic AS VP Percent: 0 %
Brady Statistic AS VS Percent: 0 %
Brady Statistic RA Percent Paced: 0 %
Brady Statistic RV Percent Paced: 99.89 %
Date Time Interrogation Session: 20211124033624
HighPow Impedance: 82 Ohm
Implantable Lead Implant Date: 20200103
Implantable Lead Implant Date: 20200103
Implantable Lead Location: 753858
Implantable Lead Location: 753860
Implantable Lead Model: 4598
Implantable Lead Model: 6935
Implantable Pulse Generator Implant Date: 20200103
Lead Channel Impedance Value: 145.871
Lead Channel Impedance Value: 145.871
Lead Channel Impedance Value: 145.871
Lead Channel Impedance Value: 145.871
Lead Channel Impedance Value: 161.5 Ohm
Lead Channel Impedance Value: 266 Ohm
Lead Channel Impedance Value: 266 Ohm
Lead Channel Impedance Value: 323 Ohm
Lead Channel Impedance Value: 323 Ohm
Lead Channel Impedance Value: 399 Ohm
Lead Channel Impedance Value: 4047 Ohm
Lead Channel Impedance Value: 437 Ohm
Lead Channel Impedance Value: 494 Ohm
Lead Channel Impedance Value: 513 Ohm
Lead Channel Impedance Value: 513 Ohm
Lead Channel Impedance Value: 513 Ohm
Lead Channel Impedance Value: 551 Ohm
Lead Channel Impedance Value: 608 Ohm
Lead Channel Pacing Threshold Amplitude: 0.375 V
Lead Channel Pacing Threshold Amplitude: 3.75 V
Lead Channel Pacing Threshold Pulse Width: 0.4 ms
Lead Channel Pacing Threshold Pulse Width: 1 ms
Lead Channel Sensing Intrinsic Amplitude: 14.75 mV
Lead Channel Sensing Intrinsic Amplitude: 14.75 mV
Lead Channel Setting Pacing Amplitude: 2.5 V
Lead Channel Setting Pacing Amplitude: 3.25 V
Lead Channel Setting Pacing Pulse Width: 0.4 ms
Lead Channel Setting Pacing Pulse Width: 1 ms
Lead Channel Setting Sensing Sensitivity: 0.3 mV

## 2020-06-26 NOTE — Progress Notes (Signed)
Remote ICD transmission.   

## 2020-06-27 DIAGNOSIS — Z7901 Long term (current) use of anticoagulants: Secondary | ICD-10-CM | POA: Diagnosis not present

## 2020-07-26 DIAGNOSIS — Z7901 Long term (current) use of anticoagulants: Secondary | ICD-10-CM | POA: Diagnosis not present

## 2020-09-06 DIAGNOSIS — Z7901 Long term (current) use of anticoagulants: Secondary | ICD-10-CM | POA: Diagnosis not present

## 2020-09-06 DIAGNOSIS — I4891 Unspecified atrial fibrillation: Secondary | ICD-10-CM | POA: Diagnosis not present

## 2020-09-19 ENCOUNTER — Ambulatory Visit (INDEPENDENT_AMBULATORY_CARE_PROVIDER_SITE_OTHER): Payer: Medicare Other

## 2020-09-19 DIAGNOSIS — I420 Dilated cardiomyopathy: Secondary | ICD-10-CM

## 2020-09-19 DIAGNOSIS — J9611 Chronic respiratory failure with hypoxia: Secondary | ICD-10-CM | POA: Diagnosis not present

## 2020-09-19 DIAGNOSIS — Z7901 Long term (current) use of anticoagulants: Secondary | ICD-10-CM | POA: Diagnosis not present

## 2020-09-19 DIAGNOSIS — Z6838 Body mass index (BMI) 38.0-38.9, adult: Secondary | ICD-10-CM | POA: Diagnosis not present

## 2020-09-19 DIAGNOSIS — I10 Essential (primary) hypertension: Secondary | ICD-10-CM | POA: Diagnosis not present

## 2020-09-19 DIAGNOSIS — E7800 Pure hypercholesterolemia, unspecified: Secondary | ICD-10-CM | POA: Diagnosis not present

## 2020-09-19 DIAGNOSIS — I5022 Chronic systolic (congestive) heart failure: Secondary | ICD-10-CM | POA: Diagnosis not present

## 2020-09-19 DIAGNOSIS — J449 Chronic obstructive pulmonary disease, unspecified: Secondary | ICD-10-CM | POA: Diagnosis not present

## 2020-09-19 DIAGNOSIS — E1169 Type 2 diabetes mellitus with other specified complication: Secondary | ICD-10-CM | POA: Diagnosis not present

## 2020-09-19 DIAGNOSIS — I482 Chronic atrial fibrillation, unspecified: Secondary | ICD-10-CM | POA: Diagnosis not present

## 2020-09-20 LAB — CUP PACEART REMOTE DEVICE CHECK
Battery Remaining Longevity: 31 mo
Battery Voltage: 2.95 V
Brady Statistic AP VP Percent: 0 %
Brady Statistic AP VS Percent: 0 %
Brady Statistic AS VP Percent: 0 %
Brady Statistic AS VS Percent: 0 %
Brady Statistic RA Percent Paced: 0 %
Brady Statistic RV Percent Paced: 99.89 %
Date Time Interrogation Session: 20220224062001
HighPow Impedance: 87 Ohm
Implantable Lead Implant Date: 20200103
Implantable Lead Implant Date: 20200103
Implantable Lead Location: 753858
Implantable Lead Location: 753860
Implantable Lead Model: 4598
Implantable Lead Model: 6935
Implantable Pulse Generator Implant Date: 20200103
Lead Channel Impedance Value: 149.625
Lead Channel Impedance Value: 149.625
Lead Channel Impedance Value: 155.455
Lead Channel Impedance Value: 155.455
Lead Channel Impedance Value: 171 Ohm
Lead Channel Impedance Value: 266 Ohm
Lead Channel Impedance Value: 285 Ohm
Lead Channel Impedance Value: 342 Ohm
Lead Channel Impedance Value: 342 Ohm
Lead Channel Impedance Value: 342 Ohm
Lead Channel Impedance Value: 4047 Ohm
Lead Channel Impedance Value: 494 Ohm
Lead Channel Impedance Value: 513 Ohm
Lead Channel Impedance Value: 513 Ohm
Lead Channel Impedance Value: 513 Ohm
Lead Channel Impedance Value: 551 Ohm
Lead Channel Impedance Value: 551 Ohm
Lead Channel Impedance Value: 608 Ohm
Lead Channel Pacing Threshold Amplitude: 0.5 V
Lead Channel Pacing Threshold Amplitude: 3.5 V
Lead Channel Pacing Threshold Pulse Width: 0.4 ms
Lead Channel Pacing Threshold Pulse Width: 1 ms
Lead Channel Sensing Intrinsic Amplitude: 14.75 mV
Lead Channel Sensing Intrinsic Amplitude: 14.75 mV
Lead Channel Setting Pacing Amplitude: 2.5 V
Lead Channel Setting Pacing Amplitude: 3.25 V
Lead Channel Setting Pacing Pulse Width: 0.4 ms
Lead Channel Setting Pacing Pulse Width: 1 ms
Lead Channel Setting Sensing Sensitivity: 0.3 mV

## 2020-09-26 NOTE — Progress Notes (Signed)
Remote ICD transmission.   

## 2020-10-03 IMAGING — DX DG CHEST 1V PORT
1 series · 1 of 1 positions shown · non-contrast
Comparison: 11/06/2015

CLINICAL DATA: AICD placement

EXAM:
PORTABLE CHEST 1 VIEW

[chest]
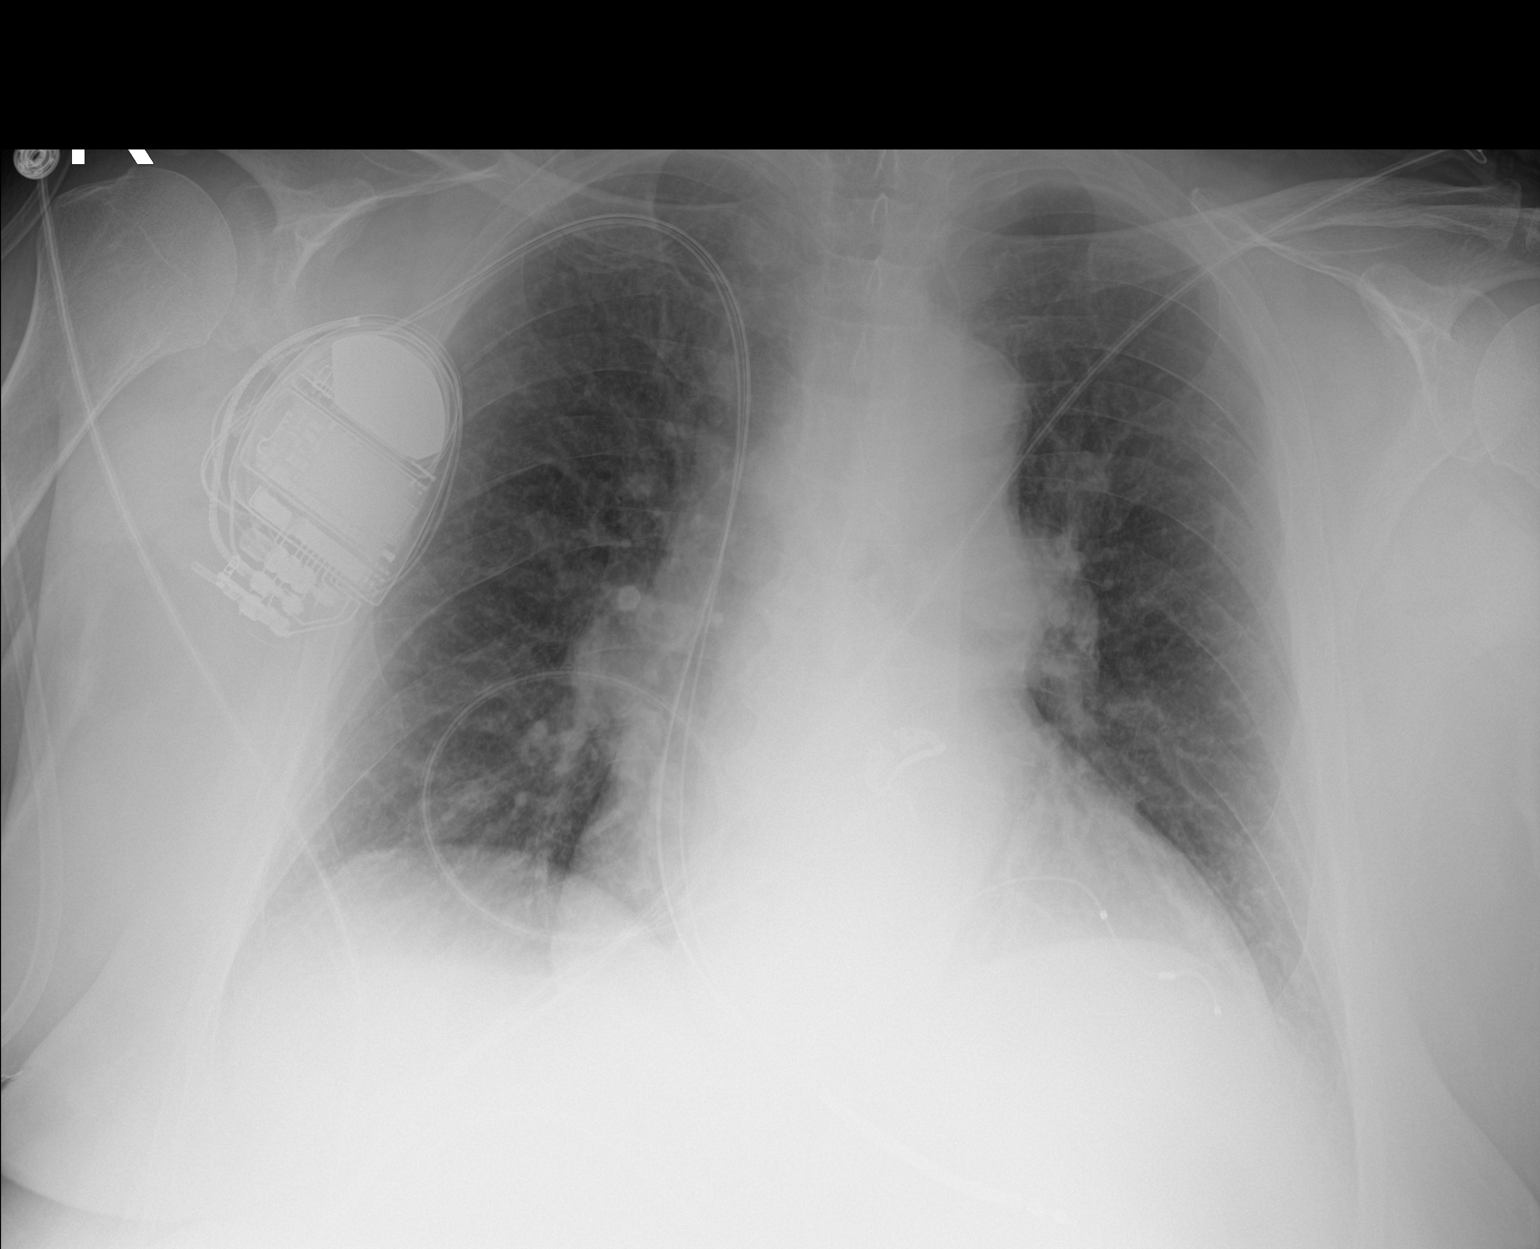

[1 of 1 positions shown; findings below may reference images not displayed]

FINDINGS: Left subclavian AICD device has been removed. Right subclavian AICD
device has been placed. Tips of the leads project over the right
ventricle apex and coronary sinus. The heart is moderately enlarged.
Vascular congestion without interstitial edema. No pneumothorax.
IMPRESSION: Right subclavian AICD device placed without pneumothorax.

## 2020-10-05 DIAGNOSIS — Z7901 Long term (current) use of anticoagulants: Secondary | ICD-10-CM | POA: Diagnosis not present

## 2020-10-05 DIAGNOSIS — E1169 Type 2 diabetes mellitus with other specified complication: Secondary | ICD-10-CM | POA: Diagnosis not present

## 2020-10-05 DIAGNOSIS — I482 Chronic atrial fibrillation, unspecified: Secondary | ICD-10-CM | POA: Diagnosis not present

## 2020-10-05 DIAGNOSIS — J449 Chronic obstructive pulmonary disease, unspecified: Secondary | ICD-10-CM | POA: Diagnosis not present

## 2020-10-05 DIAGNOSIS — E7800 Pure hypercholesterolemia, unspecified: Secondary | ICD-10-CM | POA: Diagnosis not present

## 2020-10-05 DIAGNOSIS — I10 Essential (primary) hypertension: Secondary | ICD-10-CM | POA: Diagnosis not present

## 2020-10-17 NOTE — Progress Notes (Unsigned)
Cardiology Office Note:    Date:  10/18/2020   ID:  Jaime Scott, DOB [birth year], MRN [id number]  PCP:  Greig Right, MD  Cardiologist:  Shirlee More, MD    Referring MD: Greig Right, MD    ASSESSMENT:    1. Chronic systolic heart failure ([place])   2. LBBB (left bundle branch block)   3. ICD (implantable cardioverter-defibrillator) in place   4. Chronic atrial fibrillation (HCC)   5. Long term (current) use of anticoagulants   6. Hypertensive heart disease with chronic systolic congestive heart failure ([place])   7. Chronic obstructive pulmonary disease, unspecified COPD type ([place])   8. Mixed hyperlipidemia    PLAN:    In order of problems listed above:  1. From a cardiology perspective he is stable his heart failure is compensated he is on a good medical regimen including his loop diuretic MRA and enalapril as he is decided not to take Entresto along with his beta-blocker.  Continue current treatment.  If he were to deteriorate we could revisit Entresto or vericiquat for symptomatic improvement 2. Stable device therapy followed in our practice 3. Continue current anticoagulant warfarin goal INR 2.5 4. Heart failure compensated BP at target on current guideline directed therapy 5. Stable continue ambulatory oxygen. 6. Lipids at target continue his statin   Next appointment: 6 months   Medication Adjustments/Labs and Tests Ordered: Current medicines are reviewed at length with the patient today.  Concerns regarding medicines are outlined above.  No orders of the defined types were placed in this encounter.  No orders of the defined types were placed in this encounter.   Chief Complaint  Patient presents with  . Follow-up  . Congestive Heart Failure    History of Present Illness:    Jaime Scott is a 70 y.o. male with a hx of chronic systolic heart failure sinus syndrome with tachybradycardia AV nodal ablation permanent pacemaker and chronic atrial  fibrillation left bundle branch block he has an ICD managed in our practice hypertensive heart disease with chronic systolic heart failure mixed hyperlipidemia and breast tenderness with spironolactone last seen 04/17/2020.  Recent echocardiogram showed EF of 35 to 40% mild concentric LVH 01/15/2020  Compliance with diet, lifestyle and medications: Yes  Overall he is stable and is able to care for himself and get out of the house and do shopping to come to the office. He has chronic exertional shortness of breath when he does more than usual activities walking a longer distance or an incline. He is not having edema orthopnea palpitations syncope. He decided not to take [facility] as he did not perceive an improvement in the quality of his life. Recent labs with his PCP 06/07/2020 show cholesterol 170 LDL at target 95 triglycerides 116 HDL 52 A1c 6.9% creatinine 0.83.  He plans repeats and visit in May.  His last pacemaker remote check 09/20/2020 showed normal device function and parameters with 100% RV ventricular pacing pacing activity.  KG July 2021 showed 100% ventricularly paced with underlying atrial fibrillation Past Medical History:  Diagnosis Date  . Alcohol abuse   . Arthritis   . Atrial fibrillation ([place]) 08/02/2018  . CHF (congestive heart failure) ([place]) 03/12/2017  . Chronic atrial fibrillation ([place]) 03/12/2017  . Chronic respiratory failure ([place])   . COPD (chronic obstructive pulmonary disease) ([place]) 03/12/2017  . Dyspnea    wears Oxygen 24/7 3 L  . Essential hypertension 03/12/2017  . High risk medication use 05/05/2017  . Hyperlipidemia  03/12/2017  . Hypertensive heart disease with heart failure ([place]) 03/12/2017  . ICD (implantable cardioverter-defibrillator) in place 07/30/2018  . Ischemic cardiomyopathy 03/12/2017  . Long term (current) use of anticoagulants 03/12/2017  . Peripheral vascular disease ([place]) 03/12/2017  . Preoperative cardiovascular examination 03/25/2015  . Presence of  combination internal cardiac defibrillator (ICD) and pacemaker 03/12/2017   unable to place a CRT   . Type 2 diabetes mellitus without complications ([place]) 5/37/4827    Past Surgical History:  Procedure Laterality Date  . AV NODE ABLATION N/A 08/02/2018   Procedure: AV NODE ABLATION;  Surgeon: Evans Lance, MD;  Location: [place] [facility];  Service: Cardiovascular;  Laterality: N/A;  . BIV ICD INSERTION CRT-D N/A 07/30/2018   Procedure: BIV ICD INSERTION CRT-D;  Surgeon: Evans Lance, MD;  Location: [place];  Service: Cardiovascular;  Laterality: N/A;  . [place] N/A 05/27/2018   Procedure: BIV PACEMAKER GENERATOR UPGRADE - Aborted;  Surgeon: Constance Haw, MD;  Location: [place] [facility];  Service: Cardiovascular;  Laterality: N/A;  . CHOLECYSTECTOMY    . COLONOSCOPY    . ERCP     stone removal  . EXCISION OF BREAST BIOPSY     Age 26, benign  . PACEMAKER LEAD REMOVAL N/A 07/30/2018   Procedure: PACEMAKER LEAD REMOVAL;  Surgeon: Evans Lance, MD;  Location: [facility] OR;  Service: Cardiovascular;  Laterality: N/A;    Current Medications: Current Meds  Medication Sig  . diphenhydrAMINE (BENADRYL) 25 MG tablet Take 25 mg by mouth every 6 (six) hours as needed for allergies.   Marland Kitchen enalapril (VASOTEC) 5 MG tablet TAKE 1 TABLET BY MOUTH ONCE DAILY  . eplerenone (INSPRA) 25 MG tablet Take 1 tablet (25 mg total) by mouth daily.  . furosemide (LASIX) 80 MG tablet Take 80 mg by mouth as directed. 2 tablets in the morning and 1 tablets in the evening  . ipratropium (ATROVENT) 0.02 % nebulizer solution Take 0.5 mg by nebulization every 6 (six) hours as needed for wheezing or shortness of breath.  . metFORMIN (GLUCOPHAGE-XR) 500 MG 24 hr tablet Take 1,000 mg by mouth 2 (two) times daily.   . metoprolol succinate (TOPROL-XL) 25 MG 24 hr tablet TAKE 1 TABLET BY MOUTH ONCE DAILY WITH FOOD  . OXYGEN Inhale 3 L into the lungs as needed.  . potassium chloride SA  (K-DUR,KLOR-CON) 20 MEQ tablet Take 20 mEq by mouth every evening.   . simvastatin (ZOCOR) 20 MG tablet Take 20 mg by mouth every evening.   . warfarin (COUMADIN) 1 MG tablet Take 1 mg by mouth daily.   Marland Kitchen warfarin (COUMADIN) 5 MG tablet Take 5 mg by mouth daily at 8 pm.  . [DISCONTINUED] sacubitril-valsartan (ENTRESTO) 49-51 MG Take 1 tablet by mouth 2 (two) times daily.     Allergies:   Ivp dye [iodinated diagnostic agents]   Social History   Socioeconomic History  . Marital status: Divorced    Spouse name: Not on file  . Number of children: Not on file  . Years of education: Not on file  . Highest education level: Not on file  Occupational History  . Not on file  Tobacco Use  . Smoking status: Former Smoker    Packs/day: 1.00    Years: 61.00    Pack years: 61.00    Types: Cigarettes    Quit date: 08/2018    Years since quitting: 2.1  . Smokeless tobacco: Never Used  Vaping  Use  . Vaping Use: Never used  Substance and Sexual Activity  . Alcohol use: No  . Drug use: No  . Sexual activity: Not on file  Other Topics Concern  . Not on file  Social History Narrative  . Not on file   Social Determinants of Health   Financial Resource Strain: Not on file  Food Insecurity: Not on file  Transportation Needs: Not on file  Physical Activity: Not on file  Stress: Not on file  Social Connections: Not on file     Family History: The patient's family history includes Alcohol abuse in his brother and brother; Breast cancer in his sister. ROS:   Please see the history of present illness.    All other systems reviewed and are negative.  EKGs/Labs/Other Studies Reviewed:    The following studies were reviewed today:   Recent Labs: 01/24/2020: BNP 175.1 04/17/2020: BUN 9; Creatinine, Ser 0.82; NT-Pro BNP 1,072; Potassium 5.1; Sodium 138  Recent Lipid Panel No results found for: CHOL, TRIG, HDL, CHOLHDL, VLDL, LDLCALC, LDLDIRECT  Physical Exam:    VS:  BP 134/64 (BP  Location: Right Arm, Patient Position: Sitting, Cuff Size: Normal)   Pulse 74   Ht 5\' 7"  (1.702 m)   Wt 237 lb (107.5 kg)   SpO2 96%   BMI 37.12 kg/m     Wt Readings from Last 3 Encounters:  10/18/20 237 lb (107.5 kg)  06/01/20 239 lb 12.8 oz (108.8 kg)  04/17/20 235 lb (106.6 kg)     GEN: COPD appearance well nourished, well developed in no acute distress HEENT: Normal NECK: No JVD; No carotid bruits LYMPHATICS: No lymphadenopathy CARDIAC: RRR, no murmurs, rubs, gallops RESPIRATORY:  Clear to auscultation without rales, wheezing or rhonchi  ABDOMEN: Soft, non-tender, non-distended MUSCULOSKELETAL:  No edema; No deformity  SKIN: Warm and dry NEUROLOGIC:  Alert and oriented x 3 PSYCHIATRIC:  Normal affect    Signed, Shirlee More, MD  10/18/2020 11:36 AM    [place]

## 2020-10-18 ENCOUNTER — Ambulatory Visit (INDEPENDENT_AMBULATORY_CARE_PROVIDER_SITE_OTHER): Payer: Medicare Other | Admitting: Cardiology

## 2020-10-18 ENCOUNTER — Encounter: Payer: Self-pay | Admitting: Cardiology

## 2020-10-18 ENCOUNTER — Other Ambulatory Visit: Payer: Self-pay

## 2020-10-18 VITALS — BP 134/64 | HR 74 | Ht 67.0 in | Wt 237.0 lb

## 2020-10-18 DIAGNOSIS — I5022 Chronic systolic (congestive) heart failure: Secondary | ICD-10-CM

## 2020-10-18 DIAGNOSIS — I447 Left bundle-branch block, unspecified: Secondary | ICD-10-CM

## 2020-10-18 DIAGNOSIS — E782 Mixed hyperlipidemia: Secondary | ICD-10-CM

## 2020-10-18 DIAGNOSIS — Z95810 Presence of automatic (implantable) cardiac defibrillator: Secondary | ICD-10-CM

## 2020-10-18 DIAGNOSIS — Z7901 Long term (current) use of anticoagulants: Secondary | ICD-10-CM | POA: Diagnosis not present

## 2020-10-18 DIAGNOSIS — I110 Hypertensive heart disease with heart failure: Secondary | ICD-10-CM | POA: Diagnosis not present

## 2020-10-18 DIAGNOSIS — I482 Chronic atrial fibrillation, unspecified: Secondary | ICD-10-CM | POA: Diagnosis not present

## 2020-10-18 DIAGNOSIS — J449 Chronic obstructive pulmonary disease, unspecified: Secondary | ICD-10-CM

## 2020-10-18 NOTE — Patient Instructions (Signed)

## 2020-11-07 DIAGNOSIS — I482 Chronic atrial fibrillation, unspecified: Secondary | ICD-10-CM | POA: Diagnosis not present

## 2020-11-07 DIAGNOSIS — Z7901 Long term (current) use of anticoagulants: Secondary | ICD-10-CM | POA: Diagnosis not present

## 2020-12-10 ENCOUNTER — Other Ambulatory Visit: Payer: Self-pay | Admitting: Cardiology

## 2020-12-10 NOTE — Telephone Encounter (Signed)
Refill sent to pharmacy.   

## 2020-12-17 DIAGNOSIS — J449 Chronic obstructive pulmonary disease, unspecified: Secondary | ICD-10-CM | POA: Diagnosis not present

## 2020-12-17 DIAGNOSIS — E1169 Type 2 diabetes mellitus with other specified complication: Secondary | ICD-10-CM | POA: Diagnosis not present

## 2020-12-17 DIAGNOSIS — E113393 Type 2 diabetes mellitus with moderate nonproliferative diabetic retinopathy without macular edema, bilateral: Secondary | ICD-10-CM | POA: Diagnosis not present

## 2020-12-17 DIAGNOSIS — I482 Chronic atrial fibrillation, unspecified: Secondary | ICD-10-CM | POA: Diagnosis not present

## 2020-12-17 DIAGNOSIS — E669 Obesity, unspecified: Secondary | ICD-10-CM | POA: Diagnosis not present

## 2020-12-17 DIAGNOSIS — Z6837 Body mass index (BMI) 37.0-37.9, adult: Secondary | ICD-10-CM | POA: Diagnosis not present

## 2020-12-17 DIAGNOSIS — I959 Hypotension, unspecified: Secondary | ICD-10-CM | POA: Diagnosis not present

## 2020-12-17 DIAGNOSIS — J9611 Chronic respiratory failure with hypoxia: Secondary | ICD-10-CM | POA: Diagnosis not present

## 2020-12-17 DIAGNOSIS — Z7901 Long term (current) use of anticoagulants: Secondary | ICD-10-CM | POA: Diagnosis not present

## 2020-12-19 ENCOUNTER — Ambulatory Visit (INDEPENDENT_AMBULATORY_CARE_PROVIDER_SITE_OTHER): Payer: Medicare Other

## 2020-12-19 DIAGNOSIS — I428 Other cardiomyopathies: Secondary | ICD-10-CM

## 2020-12-20 LAB — CUP PACEART REMOTE DEVICE CHECK
Battery Remaining Longevity: 27 mo
Battery Voltage: 2.94 V
Brady Statistic AP VP Percent: 0 %
Brady Statistic AP VS Percent: 0 %
Brady Statistic AS VP Percent: 0 %
Brady Statistic AS VS Percent: 0 %
Brady Statistic RA Percent Paced: 0 %
Brady Statistic RV Percent Paced: 99.87 %
Date Time Interrogation Session: 20220525022602
HighPow Impedance: 86 Ohm
Implantable Lead Implant Date: 20200103
Implantable Lead Implant Date: 20200103
Implantable Lead Location: 753858
Implantable Lead Location: 753860
Implantable Lead Model: 4598
Implantable Lead Model: 6935
Implantable Pulse Generator Implant Date: 20200103
Lead Channel Impedance Value: 133 Ohm
Lead Channel Impedance Value: 137.586
Lead Channel Impedance Value: 156.471
Lead Channel Impedance Value: 156.471
Lead Channel Impedance Value: 162.857
Lead Channel Impedance Value: 266 Ohm
Lead Channel Impedance Value: 266 Ohm
Lead Channel Impedance Value: 285 Ohm
Lead Channel Impedance Value: 380 Ohm
Lead Channel Impedance Value: 399 Ohm
Lead Channel Impedance Value: 4047 Ohm
Lead Channel Impedance Value: 437 Ohm
Lead Channel Impedance Value: 456 Ohm
Lead Channel Impedance Value: 494 Ohm
Lead Channel Impedance Value: 494 Ohm
Lead Channel Impedance Value: 570 Ohm
Lead Channel Impedance Value: 608 Ohm
Lead Channel Impedance Value: 608 Ohm
Lead Channel Pacing Threshold Amplitude: 0.5 V
Lead Channel Pacing Threshold Amplitude: 3.25 V
Lead Channel Pacing Threshold Pulse Width: 0.4 ms
Lead Channel Pacing Threshold Pulse Width: 1 ms
Lead Channel Sensing Intrinsic Amplitude: 13 mV
Lead Channel Sensing Intrinsic Amplitude: 13 mV
Lead Channel Setting Pacing Amplitude: 2.5 V
Lead Channel Setting Pacing Amplitude: 3.25 V
Lead Channel Setting Pacing Pulse Width: 0.4 ms
Lead Channel Setting Pacing Pulse Width: 1 ms
Lead Channel Setting Sensing Sensitivity: 0.3 mV

## 2021-01-09 NOTE — Progress Notes (Signed)
Remote ICD transmission.   

## 2021-01-24 DIAGNOSIS — Z7901 Long term (current) use of anticoagulants: Secondary | ICD-10-CM | POA: Diagnosis not present

## 2021-01-25 DIAGNOSIS — Z7901 Long term (current) use of anticoagulants: Secondary | ICD-10-CM | POA: Diagnosis not present

## 2021-02-01 DIAGNOSIS — Z20822 Contact with and (suspected) exposure to covid-19: Secondary | ICD-10-CM | POA: Diagnosis not present

## 2021-02-20 DIAGNOSIS — Z7901 Long term (current) use of anticoagulants: Secondary | ICD-10-CM | POA: Diagnosis not present

## 2021-02-24 DIAGNOSIS — I10 Essential (primary) hypertension: Secondary | ICD-10-CM | POA: Diagnosis not present

## 2021-02-24 DIAGNOSIS — E7800 Pure hypercholesterolemia, unspecified: Secondary | ICD-10-CM | POA: Diagnosis not present

## 2021-02-24 DIAGNOSIS — J449 Chronic obstructive pulmonary disease, unspecified: Secondary | ICD-10-CM | POA: Diagnosis not present

## 2021-02-24 DIAGNOSIS — I482 Chronic atrial fibrillation, unspecified: Secondary | ICD-10-CM | POA: Diagnosis not present

## 2021-02-24 DIAGNOSIS — I5022 Chronic systolic (congestive) heart failure: Secondary | ICD-10-CM | POA: Diagnosis not present

## 2021-02-24 DIAGNOSIS — E1169 Type 2 diabetes mellitus with other specified complication: Secondary | ICD-10-CM | POA: Diagnosis not present

## 2021-02-28 ENCOUNTER — Telehealth: Payer: Self-pay | Admitting: Internal Medicine

## 2021-02-28 NOTE — Telephone Encounter (Signed)
Tried calling patient. No answer and no voicemail set up for me to leave a message.  I do not know who called this patient.

## 2021-02-28 NOTE — Telephone Encounter (Signed)
Pt is returning a call  

## 2021-02-28 NOTE — Telephone Encounter (Signed)
Patient stated Dr. Joya Gaskins office called. Will send message to their office.

## 2021-03-01 NOTE — Telephone Encounter (Signed)
Herschal is returning [employer]. Please advise.

## 2021-03-01 NOTE — Telephone Encounter (Signed)
Tried calling patient. No answer and no voicemail set up for me to leave a message. 

## 2021-03-01 NOTE — Telephone Encounter (Signed)
Spoke to the patient just now and let him know that I did not call him and I am unsure who did. He tells me that he thinks it was to schedule his appointment.   We got him scheduled to see Dr. Bettina Gavia in October and he is happy with this.    Encouraged patient to call back with any questions or concerns.

## 2021-03-18 DIAGNOSIS — E669 Obesity, unspecified: Secondary | ICD-10-CM | POA: Diagnosis not present

## 2021-03-18 DIAGNOSIS — J449 Chronic obstructive pulmonary disease, unspecified: Secondary | ICD-10-CM | POA: Diagnosis not present

## 2021-03-18 DIAGNOSIS — Z7901 Long term (current) use of anticoagulants: Secondary | ICD-10-CM | POA: Diagnosis not present

## 2021-03-18 DIAGNOSIS — E113393 Type 2 diabetes mellitus with moderate nonproliferative diabetic retinopathy without macular edema, bilateral: Secondary | ICD-10-CM | POA: Diagnosis not present

## 2021-03-18 DIAGNOSIS — Z6837 Body mass index (BMI) 37.0-37.9, adult: Secondary | ICD-10-CM | POA: Diagnosis not present

## 2021-03-18 DIAGNOSIS — E1169 Type 2 diabetes mellitus with other specified complication: Secondary | ICD-10-CM | POA: Diagnosis not present

## 2021-03-18 DIAGNOSIS — E7800 Pure hypercholesterolemia, unspecified: Secondary | ICD-10-CM | POA: Diagnosis not present

## 2021-03-18 DIAGNOSIS — I10 Essential (primary) hypertension: Secondary | ICD-10-CM | POA: Diagnosis not present

## 2021-03-18 DIAGNOSIS — I482 Chronic atrial fibrillation, unspecified: Secondary | ICD-10-CM | POA: Diagnosis not present

## 2021-03-20 ENCOUNTER — Ambulatory Visit (INDEPENDENT_AMBULATORY_CARE_PROVIDER_SITE_OTHER): Payer: Medicare Other

## 2021-03-20 DIAGNOSIS — I428 Other cardiomyopathies: Secondary | ICD-10-CM | POA: Diagnosis not present

## 2021-03-20 LAB — CUP PACEART REMOTE DEVICE CHECK
Battery Remaining Longevity: 22 mo
Battery Voltage: 2.93 V
Brady Statistic AP VP Percent: 0 %
Brady Statistic AP VS Percent: 0 %
Brady Statistic AS VP Percent: 0 %
Brady Statistic AS VS Percent: 0 %
Brady Statistic RA Percent Paced: 0 %
Brady Statistic RV Percent Paced: 99.85 %
Date Time Interrogation Session: 20220824012504
HighPow Impedance: 87 Ohm
Implantable Lead Implant Date: 20200103
Implantable Lead Implant Date: 20200103
Implantable Lead Location: 753858
Implantable Lead Location: 753860
Implantable Lead Model: 4598
Implantable Lead Model: 6935
Implantable Pulse Generator Implant Date: 20200103
Lead Channel Impedance Value: 137.586
Lead Channel Impedance Value: 137.586
Lead Channel Impedance Value: 165.351
Lead Channel Impedance Value: 172.5 Ohm
Lead Channel Impedance Value: 172.5 Ohm
Lead Channel Impedance Value: 266 Ohm
Lead Channel Impedance Value: 285 Ohm
Lead Channel Impedance Value: 285 Ohm
Lead Channel Impedance Value: 399 Ohm
Lead Channel Impedance Value: 4047 Ohm
Lead Channel Impedance Value: 437 Ohm
Lead Channel Impedance Value: 437 Ohm
Lead Channel Impedance Value: 456 Ohm
Lead Channel Impedance Value: 494 Ohm
Lead Channel Impedance Value: 513 Ohm
Lead Channel Impedance Value: 646 Ohm
Lead Channel Impedance Value: 646 Ohm
Lead Channel Impedance Value: 665 Ohm
Lead Channel Pacing Threshold Amplitude: 0.5 V
Lead Channel Pacing Threshold Amplitude: 3.25 V
Lead Channel Pacing Threshold Pulse Width: 0.4 ms
Lead Channel Pacing Threshold Pulse Width: 1 ms
Lead Channel Sensing Intrinsic Amplitude: 7.75 mV
Lead Channel Sensing Intrinsic Amplitude: 7.75 mV
Lead Channel Setting Pacing Amplitude: 2.5 V
Lead Channel Setting Pacing Amplitude: 3.25 V
Lead Channel Setting Pacing Pulse Width: 0.4 ms
Lead Channel Setting Pacing Pulse Width: 1 ms
Lead Channel Setting Sensing Sensitivity: 0.3 mV

## 2021-04-04 NOTE — Progress Notes (Signed)
Remote ICD transmission.   

## 2021-04-22 DIAGNOSIS — Z7901 Long term (current) use of anticoagulants: Secondary | ICD-10-CM | POA: Diagnosis not present

## 2021-04-29 ENCOUNTER — Other Ambulatory Visit: Payer: Self-pay | Admitting: Cardiology

## 2021-05-02 NOTE — Progress Notes (Signed)
Cardiology Office Note:    Date:  05/03/2021   ID:  PACEY ALTIZER, DOB 06-Aug-1950, MRN [id number]  PCP:  Greig Right, MD  Cardiologist:  Shirlee More, MD    Referring MD: Greig Right, MD    ASSESSMENT:    1. Chronic atrial fibrillation ([place])   2. Long term (current) use of anticoagulants   3. ICD (implantable cardioverter-defibrillator) in place   4. Chronic systolic heart failure ([place])   5. Hypertensive heart disease with chronic systolic congestive heart failure ([place])   6. Essential hypertension   7. Mixed hyperlipidemia   8. Chronic obstructive pulmonary disease, unspecified COPD type ([place])    PLAN:    In order of problems listed above:  In general he is doing well he has rate controlled atrial fibrillation he is ventricularly paced and anticoagulated with warfarin managed with his PCP goal INR 2.5 he has had no bleeding complication Stable ICD followed by device clinic last checked 03/20/2021 with stable device function and CRT 90% of the time Compensated continue current treatment in the past he was on Entresto could not afford and will continue his current medical regimen including loop diuretic MRA beta-blocker ACE inhibitor and finances preclude SGLT2 inhibitor and Entresto.  Consider repeat echocardiogram next visit Stable continue his current statin\stable on continuous oxygen   Next appointment: 6 months   Medication Adjustments/Labs and Tests Ordered: Current medicines are reviewed at length with the patient today.  Concerns regarding medicines are outlined above.  Orders Placed This Encounter  Procedures   EKG 12-Lead   No orders of the defined types were placed in this encounter.   Chief Complaint  Patient presents with   Follow-up    History of Present Illness:    RIAZ ONORATO is a 70 y.o. male with a hx of chronic systolic heart failure sick sinus syndrome with AV nodal ablation to manage rapid atrial fibrillation permanent pacemaker  chronic atrial fibrillation left bundle branch block and pacemaker followed in our practice.  Echocardiogram in June 2021 showed EF of 35 to 40%.  He was last seen 10/18/2020.  Compliance with diet, lifestyle and medications: Yes  Overall he is doing better he has chronic stable shortness of breath.  Lung disease edema orthopnea chest pain palpitation or syncope.  He has had no bleeding complications on warfarin managed by his PCP and tolerates statin without muscle pain or weakness.  He is pleased with the quality of his life.  He tells me labs are done every 3 months with his PCP and request a copy and that his dose of enalapril was decreased with borderline low blood pressure Past Medical History:  Diagnosis Date   Alcohol abuse    Arthritis    Atrial fibrillation ([place]) 08/02/2018   CHF (congestive heart failure) ([place]) 03/12/2017   Chronic atrial fibrillation ([place]) 03/12/2017   Chronic respiratory failure (HCC)    COPD (chronic obstructive pulmonary disease) ([place]) 03/12/2017   Dyspnea    wears Oxygen 24/7 3 L   Essential hypertension 03/12/2017   High risk medication use 05/05/2017   Hyperlipidemia 03/12/2017   Hypertensive heart disease with heart failure ([place]) 03/12/2017   ICD (implantable cardioverter-defibrillator) in place 07/30/2018   Ischemic cardiomyopathy 03/12/2017   Long term (current) use of anticoagulants 03/12/2017   Peripheral vascular disease ([place]) 03/12/2017   Preoperative cardiovascular examination 03/25/2015   Presence of combination internal cardiac defibrillator (ICD) and pacemaker 03/12/2017   unable to place a CRT  Type 2 diabetes mellitus without complications ([place]) 3/71/6967    Past Surgical History:  Procedure Laterality Date   AV NODE ABLATION N/A 08/02/2018   Procedure: AV NODE ABLATION;  Surgeon: Evans Lance, MD;  Location: [place] [facility];  Service: Cardiovascular;  Laterality: N/A;   BIV ICD INSERTION CRT-D N/A 07/30/2018   Procedure: BIV ICD INSERTION  CRT-D;  Surgeon: Evans Lance, MD;  Location: [facility];  Service: Cardiovascular;  Laterality: N/A;   BIV PACEMAKER GENERATOR CHANGEOUT N/A 05/27/2018   Procedure: BIV PACEMAKER GENERATOR UPGRADE - Aborted;  Surgeon: Constance Haw, MD;  Location: [facility];  Service: Cardiovascular;  Laterality: N/A;   CHOLECYSTECTOMY     COLONOSCOPY     ERCP     stone removal   EXCISION OF BREAST BIOPSY     Age 30, benign   PACEMAKER LEAD REMOVAL N/A 07/30/2018   Procedure: PACEMAKER LEAD REMOVAL;  Surgeon: Evans Lance, MD;  Location: [facility] OR;  Service: Cardiovascular;  Laterality: N/A;    Current Medications: Current Meds  Medication Sig   diphenhydrAMINE (BENADRYL) 25 MG tablet Take 25 mg by mouth every 6 (six) hours as needed for allergies.    enalapril (VASOTEC) 5 MG tablet TAKE 1 TABLET BY MOUTH ONCE DAILY (Patient taking differently: Take 2.5 mg by mouth daily.)   eplerenone (INSPRA) 25 MG tablet TAKE ONE TABLET BY MOUTH EVERY DAY (Patient taking differently: Take 25 mg by mouth daily.)   furosemide (LASIX) 80 MG tablet Take 80 mg by mouth as directed. 2 tablets in the morning and 1 tablets in the evening   metFORMIN (GLUCOPHAGE-XR) 500 MG 24 hr tablet Take 1,000 mg by mouth 2 (two) times daily.    metoprolol succinate (TOPROL-XL) 25 MG 24 hr tablet TAKE 1 TABLET BY MOUTH ONCE DAILY WITH FOOD (Patient taking differently: Take 25 mg by mouth daily.)   OXYGEN Inhale 3 L into the lungs as needed.   potassium chloride SA (K-DUR,KLOR-CON) 20 MEQ tablet Take 20 mEq by mouth every evening.    simvastatin (ZOCOR) 20 MG tablet Take 20 mg by mouth every evening.    warfarin (COUMADIN) 1 MG tablet Take 1 mg by mouth daily.    warfarin (COUMADIN) 5 MG tablet Take 5 mg by mouth daily at 8 pm.     Allergies:   Ivp dye [iodinated diagnostic agents]   Social History   Socioeconomic History   Marital status: Divorced    Spouse name: Not on file   Number of children: Not on file   Years of  education: Not on file   Highest education level: Not on file  Occupational History   Not on file  Tobacco Use   Smoking status: Former    Packs/day: 1.00    Years: 61.00    Pack years: 61.00    Types: Cigarettes    Quit date: 08/2018    Years since quitting: 2.6   Smokeless tobacco: Never  Vaping Use   Vaping Use: Never used  Substance and Sexual Activity   Alcohol use: No   Drug use: No   Sexual activity: Not on file  Other Topics Concern   Not on file  Social History Narrative   Not on file   Social Determinants of Health   Financial Resource Strain: Not on file  Food Insecurity: Not on file  Transportation Needs: Not on file  Physical Activity: Not on file  Stress: Not on file  Social  Connections: Not on file     Family History: The patient's family history includes Alcohol abuse in his brother and brother; Breast cancer in his sister. ROS:   Please see the history of present illness.    All other systems reviewed and are negative.  EKGs/Labs/Other Studies Reviewed:    The following studies were reviewed today:  EKG:  EKG ordered today and personally reviewed.  The ekg ordered today demonstrates atrial fibrillation ventricular paced rhythm  Recent Labs: Requested by PCP  Physical Exam:    VS:  BP 102/60 (BP Location: Right Arm, Patient Position: Sitting)   Pulse 66   Ht 5\' 7"  (1.702 m)   Wt 233 lb 3.2 oz (105.8 kg)   SpO2 95%   BMI 36.52 kg/m     Wt Readings from Last 3 Encounters:  05/03/21 233 lb 3.2 oz (105.8 kg)  10/18/20 237 lb (107.5 kg)  06/01/20 239 lb 12.8 oz (108.8 kg)     GEN:  Well nourished, well developed in no acute distress HEENT: Normal NECK: No JVD; No carotid bruits LYMPHATICS: No lymphadenopathy CARDIAC: Irregular rate and rhythm , no murmurs, rubs, gallops RESPIRATORY:  Clear to auscultation without rales, wheezing or rhonchi  ABDOMEN: Soft, non-tender, non-distended MUSCULOSKELETAL:  No edema; No deformity  SKIN: Warm  and dry NEUROLOGIC:  Alert and oriented x 3 PSYCHIATRIC:  Normal affect    Signed, Shirlee More, MD  05/03/2021 1:41 PM    [facility]

## 2021-05-03 ENCOUNTER — Encounter: Payer: Self-pay | Admitting: Cardiology

## 2021-05-03 ENCOUNTER — Ambulatory Visit (INDEPENDENT_AMBULATORY_CARE_PROVIDER_SITE_OTHER): Payer: Medicare Other | Admitting: Cardiology

## 2021-05-03 ENCOUNTER — Other Ambulatory Visit: Payer: Self-pay

## 2021-05-03 VITALS — BP 102/60 | HR 66 | Ht 67.0 in | Wt 233.2 lb

## 2021-05-03 DIAGNOSIS — I110 Hypertensive heart disease with heart failure: Secondary | ICD-10-CM | POA: Diagnosis not present

## 2021-05-03 DIAGNOSIS — I10 Essential (primary) hypertension: Secondary | ICD-10-CM | POA: Diagnosis not present

## 2021-05-03 DIAGNOSIS — I5022 Chronic systolic (congestive) heart failure: Secondary | ICD-10-CM

## 2021-05-03 DIAGNOSIS — J449 Chronic obstructive pulmonary disease, unspecified: Secondary | ICD-10-CM

## 2021-05-03 DIAGNOSIS — Z95810 Presence of automatic (implantable) cardiac defibrillator: Secondary | ICD-10-CM

## 2021-05-03 DIAGNOSIS — E782 Mixed hyperlipidemia: Secondary | ICD-10-CM

## 2021-05-03 DIAGNOSIS — Z7901 Long term (current) use of anticoagulants: Secondary | ICD-10-CM

## 2021-05-03 DIAGNOSIS — I482 Chronic atrial fibrillation, unspecified: Secondary | ICD-10-CM | POA: Diagnosis not present

## 2021-05-03 NOTE — Patient Instructions (Signed)
Medication Instructions:  Your physician recommends that you continue on your current medications as directed. Please refer to the Current Medication list given to you today.  *If you need a refill on your cardiac medications before your next appointment, please call your pharmacy*   Lab Work: None If you have labs (blood work) drawn today and your tests are completely normal, you will receive your results only by: [place] (if you have MyChart) OR A paper copy in the mail If you have any lab test that is abnormal or we need to change your treatment, we will call you to review the results.   Testing/Procedures: None   Follow-Up: At [facility], you and your health needs are our priority.  As part of our continuing mission to provide you with exceptional heart care, we have created designated Provider Care Teams.  These Care Teams include your primary Cardiologist (physician) and Advanced Practice Providers (APPs -  Physician Assistants and Nurse Practitioners) who all work together to provide you with the care you need, when you need it.  We recommend signing up for the patient portal called "MyChart".  Sign up information is provided on this After Visit Summary.  MyChart is used to connect with patients for Virtual Visits (Telemedicine).  Patients are able to view lab/test results, encounter notes, upcoming appointments, etc.  Non-urgent messages can be sent to your provider as well.   To learn more about what you can do with MyChart, go to [email or web address].    Your next appointment:   9 month(s)  The format for your next appointment:   In Person  Provider:  Dr. Bettina Gavia    Other Instructions

## 2021-05-28 DIAGNOSIS — Z7901 Long term (current) use of anticoagulants: Secondary | ICD-10-CM | POA: Diagnosis not present

## 2021-06-19 ENCOUNTER — Ambulatory Visit (INDEPENDENT_AMBULATORY_CARE_PROVIDER_SITE_OTHER): Payer: Medicare Other

## 2021-06-19 DIAGNOSIS — I739 Peripheral vascular disease, unspecified: Secondary | ICD-10-CM | POA: Diagnosis not present

## 2021-06-19 DIAGNOSIS — E669 Obesity, unspecified: Secondary | ICD-10-CM | POA: Diagnosis not present

## 2021-06-19 DIAGNOSIS — Z6837 Body mass index (BMI) 37.0-37.9, adult: Secondary | ICD-10-CM | POA: Diagnosis not present

## 2021-06-19 DIAGNOSIS — I10 Essential (primary) hypertension: Secondary | ICD-10-CM | POA: Diagnosis not present

## 2021-06-19 DIAGNOSIS — E7800 Pure hypercholesterolemia, unspecified: Secondary | ICD-10-CM | POA: Diagnosis not present

## 2021-06-19 DIAGNOSIS — Z23 Encounter for immunization: Secondary | ICD-10-CM | POA: Diagnosis not present

## 2021-06-19 DIAGNOSIS — E1169 Type 2 diabetes mellitus with other specified complication: Secondary | ICD-10-CM | POA: Diagnosis not present

## 2021-06-19 DIAGNOSIS — I4891 Unspecified atrial fibrillation: Secondary | ICD-10-CM | POA: Diagnosis not present

## 2021-06-19 DIAGNOSIS — J449 Chronic obstructive pulmonary disease, unspecified: Secondary | ICD-10-CM | POA: Diagnosis not present

## 2021-06-19 DIAGNOSIS — I428 Other cardiomyopathies: Secondary | ICD-10-CM

## 2021-06-19 DIAGNOSIS — Z0000 Encounter for general adult medical examination without abnormal findings: Secondary | ICD-10-CM | POA: Diagnosis not present

## 2021-06-19 DIAGNOSIS — Z7901 Long term (current) use of anticoagulants: Secondary | ICD-10-CM | POA: Diagnosis not present

## 2021-06-19 LAB — CUP PACEART REMOTE DEVICE CHECK
Battery Remaining Longevity: 19 mo
Battery Voltage: 2.93 V
Brady Statistic AP VP Percent: 0 %
Brady Statistic AP VS Percent: 0 %
Brady Statistic AS VP Percent: 0 %
Brady Statistic AS VS Percent: 0 %
Brady Statistic RA Percent Paced: 0 %
Brady Statistic RV Percent Paced: 99.82 %
Date Time Interrogation Session: 20221123033324
HighPow Impedance: 80 Ohm
Implantable Lead Implant Date: 20200103
Implantable Lead Implant Date: 20200103
Implantable Lead Location: 753858
Implantable Lead Location: 753860
Implantable Lead Model: 4598
Implantable Lead Model: 6935
Implantable Pulse Generator Implant Date: 20200103
Lead Channel Impedance Value: 137.586
Lead Channel Impedance Value: 137.586
Lead Channel Impedance Value: 149.625
Lead Channel Impedance Value: 149.625
Lead Channel Impedance Value: 155.455
Lead Channel Impedance Value: 266 Ohm
Lead Channel Impedance Value: 266 Ohm
Lead Channel Impedance Value: 285 Ohm
Lead Channel Impedance Value: 342 Ohm
Lead Channel Impedance Value: 342 Ohm
Lead Channel Impedance Value: 4047 Ohm
Lead Channel Impedance Value: 437 Ohm
Lead Channel Impedance Value: 456 Ohm
Lead Channel Impedance Value: 494 Ohm
Lead Channel Impedance Value: 513 Ohm
Lead Channel Impedance Value: 513 Ohm
Lead Channel Impedance Value: 513 Ohm
Lead Channel Impedance Value: 551 Ohm
Lead Channel Pacing Threshold Amplitude: 0.375 V
Lead Channel Pacing Threshold Amplitude: 3.5 V
Lead Channel Pacing Threshold Pulse Width: 0.4 ms
Lead Channel Pacing Threshold Pulse Width: 1 ms
Lead Channel Sensing Intrinsic Amplitude: 7.25 mV
Lead Channel Sensing Intrinsic Amplitude: 7.25 mV
Lead Channel Setting Pacing Amplitude: 2.5 V
Lead Channel Setting Pacing Amplitude: 3.25 V
Lead Channel Setting Pacing Pulse Width: 0.4 ms
Lead Channel Setting Pacing Pulse Width: 1 ms
Lead Channel Setting Sensing Sensitivity: 0.3 mV

## 2021-06-26 DIAGNOSIS — I10 Essential (primary) hypertension: Secondary | ICD-10-CM | POA: Diagnosis not present

## 2021-06-26 DIAGNOSIS — I739 Peripheral vascular disease, unspecified: Secondary | ICD-10-CM | POA: Diagnosis not present

## 2021-06-26 DIAGNOSIS — E785 Hyperlipidemia, unspecified: Secondary | ICD-10-CM | POA: Diagnosis not present

## 2021-06-27 NOTE — Progress Notes (Signed)
Remote ICD transmission.   

## 2021-07-29 ENCOUNTER — Other Ambulatory Visit: Payer: Self-pay | Admitting: Cardiology

## 2021-09-05 DIAGNOSIS — Z20822 Contact with and (suspected) exposure to covid-19: Secondary | ICD-10-CM | POA: Diagnosis not present

## 2021-09-18 ENCOUNTER — Ambulatory Visit (INDEPENDENT_AMBULATORY_CARE_PROVIDER_SITE_OTHER): Payer: Medicare Other

## 2021-09-18 DIAGNOSIS — I4891 Unspecified atrial fibrillation: Secondary | ICD-10-CM | POA: Diagnosis not present

## 2021-09-18 DIAGNOSIS — E7800 Pure hypercholesterolemia, unspecified: Secondary | ICD-10-CM | POA: Diagnosis not present

## 2021-09-18 DIAGNOSIS — E1169 Type 2 diabetes mellitus with other specified complication: Secondary | ICD-10-CM | POA: Diagnosis not present

## 2021-09-18 DIAGNOSIS — E669 Obesity, unspecified: Secondary | ICD-10-CM | POA: Diagnosis not present

## 2021-09-18 DIAGNOSIS — I10 Essential (primary) hypertension: Secondary | ICD-10-CM | POA: Diagnosis not present

## 2021-09-18 DIAGNOSIS — I428 Other cardiomyopathies: Secondary | ICD-10-CM

## 2021-09-18 DIAGNOSIS — E11319 Type 2 diabetes mellitus with unspecified diabetic retinopathy without macular edema: Secondary | ICD-10-CM | POA: Diagnosis not present

## 2021-09-18 DIAGNOSIS — J9611 Chronic respiratory failure with hypoxia: Secondary | ICD-10-CM | POA: Diagnosis not present

## 2021-09-18 DIAGNOSIS — J449 Chronic obstructive pulmonary disease, unspecified: Secondary | ICD-10-CM | POA: Diagnosis not present

## 2021-09-18 DIAGNOSIS — Z6837 Body mass index (BMI) 37.0-37.9, adult: Secondary | ICD-10-CM | POA: Diagnosis not present

## 2021-09-18 DIAGNOSIS — Z7901 Long term (current) use of anticoagulants: Secondary | ICD-10-CM | POA: Diagnosis not present

## 2021-09-18 DIAGNOSIS — G4733 Obstructive sleep apnea (adult) (pediatric): Secondary | ICD-10-CM | POA: Diagnosis not present

## 2021-09-18 LAB — CUP PACEART REMOTE DEVICE CHECK
Battery Remaining Longevity: 16 mo
Battery Voltage: 2.92 V
Brady Statistic AP VP Percent: 0 %
Brady Statistic AP VS Percent: 0 %
Brady Statistic AS VP Percent: 0 %
Brady Statistic AS VS Percent: 0 %
Brady Statistic RA Percent Paced: 0 %
Brady Statistic RV Percent Paced: 99.78 %
Date Time Interrogation Session: 20230222022503
HighPow Impedance: 85 Ohm
Implantable Lead Implant Date: 20200103
Implantable Lead Implant Date: 20200103
Implantable Lead Location: 753858
Implantable Lead Location: 753860
Implantable Lead Model: 4598
Implantable Lead Model: 6935
Implantable Pulse Generator Implant Date: 20200103
Lead Channel Impedance Value: 155.455
Lead Channel Impedance Value: 166.114
Lead Channel Impedance Value: 172.5 Ohm
Lead Channel Impedance Value: 185.725
Lead Channel Impedance Value: 191.854
Lead Channel Impedance Value: 285 Ohm
Lead Channel Impedance Value: 323 Ohm
Lead Channel Impedance Value: 342 Ohm
Lead Channel Impedance Value: 399 Ohm
Lead Channel Impedance Value: 4047 Ohm
Lead Channel Impedance Value: 437 Ohm
Lead Channel Impedance Value: 456 Ohm
Lead Channel Impedance Value: 513 Ohm
Lead Channel Impedance Value: 551 Ohm
Lead Channel Impedance Value: 551 Ohm
Lead Channel Impedance Value: 646 Ohm
Lead Channel Impedance Value: 646 Ohm
Lead Channel Impedance Value: 665 Ohm
Lead Channel Pacing Threshold Amplitude: 0.5 V
Lead Channel Pacing Threshold Amplitude: 3.5 V
Lead Channel Pacing Threshold Pulse Width: 0.4 ms
Lead Channel Pacing Threshold Pulse Width: 1 ms
Lead Channel Sensing Intrinsic Amplitude: 10.5 mV
Lead Channel Sensing Intrinsic Amplitude: 10.5 mV
Lead Channel Setting Pacing Amplitude: 2.5 V
Lead Channel Setting Pacing Amplitude: 3.25 V
Lead Channel Setting Pacing Pulse Width: 0.4 ms
Lead Channel Setting Pacing Pulse Width: 1 ms
Lead Channel Setting Sensing Sensitivity: 0.3 mV

## 2021-09-23 ENCOUNTER — Telehealth: Payer: Self-pay

## 2021-09-23 NOTE — Telephone Encounter (Signed)
Spoke with pt.  He stated he has an appt this week.  Verified pt is scheduled for 3/3 with andy Tillery.  Updated appt notes.

## 2021-09-24 NOTE — Progress Notes (Signed)
Remote ICD transmission.   

## 2021-09-26 NOTE — Progress Notes (Signed)
Electrophysiology Office Note Date: 09/26/2021  ID:  Jaime Scott, DOB 10-Jul-1951, MRN [id number]  PCP: Greig Right, MD Primary Cardiologist: None Electrophysiologist: Cristopher Peru, MD   CC: Routine ICD follow-up  Jaime Scott is a 71 y.o. male seen today for Cristopher Peru, MD for routine electrophysiology followup.  Since last being seen in our clinic the patient reports doing OK. Has chest discomfort at times that comes on suddenly, described as a pressure, but only ever occurs at rest. Possibly related to food. Never lasts for longer than a minute. He is on chronic O2. Has lightheadedness at times with rapid standing.  he denies exertional chest pain, palpitations, PND, orthopnea, nausea, vomiting, syncope, edema, weight gain, or early satiety. He has not had ICD shocks.   Device History: Medtronic BiV ICD implanted 2017, AV nodal ablation and CRT upgrade 2020 for CHF and tachy brady  Past Medical History:  Diagnosis Date   Alcohol abuse    Arthritis    Atrial fibrillation ([place]) 08/02/2018   CHF (congestive heart failure) ([place]) 03/12/2017   Chronic atrial fibrillation (HCC) 03/12/2017   Chronic respiratory failure (HCC)    COPD (chronic obstructive pulmonary disease) ([place]) 03/12/2017   Dyspnea    wears Oxygen 24/7 3 L   Essential hypertension 03/12/2017   High risk medication use 05/05/2017   Hyperlipidemia 03/12/2017   Hypertensive heart disease with heart failure ([place]) 03/12/2017   ICD (implantable cardioverter-defibrillator) in place 07/30/2018   Ischemic cardiomyopathy 03/12/2017   Long term (current) use of anticoagulants 03/12/2017   Peripheral vascular disease ([place]) 03/12/2017   Preoperative cardiovascular examination 03/25/2015   Presence of combination internal cardiac defibrillator (ICD) and pacemaker 03/12/2017   unable to place a CRT    Type 2 diabetes mellitus without complications ([place]) 3/38/2505   Past Surgical History:  Procedure Laterality Date   AV NODE  ABLATION N/A 08/02/2018   Procedure: AV NODE ABLATION;  Surgeon: Evans Lance, MD;  Location: [facility];  Service: Cardiovascular;  Laterality: N/A;   BIV ICD INSERTION CRT-D N/A 07/30/2018   Procedure: BIV ICD INSERTION CRT-D;  Surgeon: Evans Lance, MD;  Location: [facility];  Service: Cardiovascular;  Laterality: N/A;   BIV PACEMAKER GENERATOR CHANGEOUT N/A 05/27/2018   Procedure: BIV PACEMAKER GENERATOR UPGRADE - Aborted;  Surgeon: Constance Haw, MD;  Location: [facility];  Service: Cardiovascular;  Laterality: N/A;   CHOLECYSTECTOMY     COLONOSCOPY     ERCP     stone removal   EXCISION OF BREAST BIOPSY     Age 63, benign   PACEMAKER LEAD REMOVAL N/A 07/30/2018   Procedure: PACEMAKER LEAD REMOVAL;  Surgeon: Evans Lance, MD;  [facility];  Service: Cardiovascular;  Laterality: N/A;    Current Outpatient Medications  Medication Sig Dispense Refill   diphenhydrAMINE (BENADRYL) 25 MG tablet Take 25 mg by mouth every 6 (six) hours as needed for allergies.      enalapril (VASOTEC) 5 MG tablet Take 1 tablet (5 mg total) by mouth daily. 90 tablet 2   eplerenone (INSPRA) 25 MG tablet Take 1 tablet (25 mg total) by mouth daily. 90 tablet 2   furosemide (LASIX) 80 MG tablet Take 80 mg by mouth as directed. 2 tablets in the morning and 1 tablets in the evening     ipratropium (ATROVENT) 0.02 % nebulizer solution Take 0.5 mg by nebulization every 6 (six) hours as needed for wheezing or shortness of  breath. (Patient not taking: Reported on 05/03/2021)     metFORMIN (GLUCOPHAGE-XR) 500 MG 24 hr tablet Take 1,000 mg by mouth 2 (two) times daily.      metoprolol succinate (TOPROL-XL) 25 MG 24 hr tablet TAKE 1 TABLET BY MOUTH ONCE DAILY WITH FOOD (Patient taking differently: Take 25 mg by mouth daily.) 90 tablet 3   OXYGEN Inhale 3 L into the lungs as needed.     potassium chloride SA (K-DUR,KLOR-CON) 20 MEQ tablet Take 20 mEq by mouth every evening.      simvastatin (ZOCOR) 20  MG tablet Take 20 mg by mouth every evening.      warfarin (COUMADIN) 1 MG tablet Take 1 mg by mouth daily.      warfarin (COUMADIN) 5 MG tablet Take 5 mg by mouth daily at 8 pm.     No current facility-administered medications for this visit.    Allergies:   Ivp dye [iodinated contrast media]   Social History: Social History   Socioeconomic History   Marital status: Divorced    Spouse name: Not on file   Number of children: Not on file   Years of education: Not on file   Highest education level: Not on file  Occupational History   Not on file  Tobacco Use   Smoking status: Former    Packs/day: 1.00    Years: 61.00    Pack years: 61.00    Types: Cigarettes    Quit date: 08/2018    Years since quitting: 3.0   Smokeless tobacco: Never  Vaping Use   Vaping Use: Never used  Substance and Sexual Activity   Alcohol use: No   Drug use: No   Sexual activity: Not on file  Other Topics Concern   Not on file  Social History Narrative   Not on file   Social Determinants of Health   Financial Resource Strain: Not on file  Food Insecurity: Not on file  Transportation Needs: Not on file  Physical Activity: Not on file  Stress: Not on file  Social Connections: Not on file  Intimate Partner Violence: Not on file    Family History: Family History  Problem Relation Age of Onset   Breast cancer Sister    Alcohol abuse Brother    Alcohol abuse Brother     Review of Systems: All other systems reviewed and are otherwise negative except as noted above.   Physical Exam: Vitals:   09/27/21 0913  BP: (!) 92/58  Pulse: 75  SpO2: 90%  Weight: 229 lb 3.2 oz (104 kg)  Height: 5\' 7"  (1.702 m)     GEN- The patient is well appearing, alert and oriented x 3 today.   HEENT: normocephalic, atraumatic; sclera clear, conjunctiva pink; hearing intact; oropharynx clear; neck supple, no JVP Lymph- no cervical lymphadenopathy Lungs- Clear to ausculation bilaterally, normal work of  breathing.  No wheezes, rales, rhonchi Heart- Regular rate and rhythm, no murmurs, rubs or gallops, PMI not laterally displaced GI- soft, non-tender, non-distended, bowel sounds present, no hepatosplenomegaly Extremities- no clubbing or cyanosis. No edema; DP/PT/radial pulses 2+ bilaterally MS- no significant deformity or atrophy Skin- warm and dry, no rash or lesion; ICD pocket well healed Psych- euthymic mood, full affect Neuro- strength and sensation are intact  ICD interrogation- reviewed in detail today,  See PACEART report  EKG:  EKG is ordered today. Personal review of EKG ordered today shows AF, BiV pacing at 67 bpm, upward in 1, initial isoelectric in  V1 then downward.     EKG with RV pacing shows LBBB.  Recent Labs: No results found for requested labs within last 8760 hours.   Wt Readings from Last 3 Encounters:  05/03/21 233 lb 3.2 oz (105.8 kg)  10/18/20 237 lb (107.5 kg)  06/01/20 239 lb 12.8 oz (108.8 kg)     Other studies Reviewed: Additional studies/ records that were reviewed today include: Previous EP office notes.   Assessment and Plan:  1.  Chronic systolic dysfunction s/p Medtronic CRT-D  euvolemic today Stable on an appropriate medical regimen Normal ICD function See Pace Art report No changes today Pt with very complicated lead history. Previous left sided device  Previously had elevated treshold LV1-LV2. Currently programmed LV 1 to RV coil with best threshold and best battery life. EKG relatively unchanged and V-V optimization was ineffective at narrowing QRS any further or making V1 more positive.  Update echo Labs today.  Previously refused entresto due to cost and no perceived benefit, and now poor candidate with hypotension.   2. Atrial fib Rates controlled  3. HTN Stable on current regimen   4. LV elevated threshold Stable with manual check. Threshold 2.25 V @ 1.00 ms. As prior, he has no suitable alternatives.  Update CXR  Current  medicines are reviewed at length with the patient today.    Labs/ tests ordered today include:  Orders Placed This Encounter  Procedures   DG Chest 2 View   Basic metabolic panel   Pro b natriuretic peptide (BNP)   CBC   EKG 12-Lead   ECHOCARDIOGRAM COMPLETE   Disposition:   Follow up with Dr. Lovena Le in 6 months   Signed, Shirley Friar, PA-C  09/26/2021 12:50 PM  [facility] [address] [place] [address] [phone] (office) [phone] (fax)

## 2021-09-27 ENCOUNTER — Other Ambulatory Visit: Payer: Self-pay

## 2021-09-27 ENCOUNTER — Encounter: Payer: Self-pay | Admitting: Student

## 2021-09-27 ENCOUNTER — Ambulatory Visit (INDEPENDENT_AMBULATORY_CARE_PROVIDER_SITE_OTHER): Payer: Medicare Other | Admitting: Student

## 2021-09-27 VITALS — BP 92/58 | HR 75 | Ht 67.0 in | Wt 229.2 lb

## 2021-09-27 DIAGNOSIS — I5022 Chronic systolic (congestive) heart failure: Secondary | ICD-10-CM | POA: Diagnosis not present

## 2021-09-27 DIAGNOSIS — I482 Chronic atrial fibrillation, unspecified: Secondary | ICD-10-CM | POA: Diagnosis not present

## 2021-09-27 DIAGNOSIS — Z95810 Presence of automatic (implantable) cardiac defibrillator: Secondary | ICD-10-CM

## 2021-09-27 DIAGNOSIS — I428 Other cardiomyopathies: Secondary | ICD-10-CM | POA: Diagnosis not present

## 2021-09-27 LAB — CUP PACEART INCLINIC DEVICE CHECK
Date Time Interrogation Session: 20230303100848
Implantable Lead Implant Date: 20200103
Implantable Lead Implant Date: 20200103
Implantable Lead Location: 753858
Implantable Lead Location: 753860
Implantable Lead Model: 4598
Implantable Lead Model: 6935
Implantable Pulse Generator Implant Date: 20200103

## 2021-09-27 NOTE — Patient Instructions (Signed)
Medication Instructions:  ?Your physician recommends that you continue on your current medications as directed. Please refer to the Current Medication list given to you today. ? ?*If you need a refill on your cardiac medications before your next appointment, please call your pharmacy* ? ? ?Lab Work: ?TODAY: BMET, CBC, BNP ? ?If you have labs (blood work) drawn today and your tests are completely normal, you will receive your results only by: ?MyChart Message (if you have MyChart) OR ?A paper copy in the mail ?If you have any lab test that is abnormal or we need to change your treatment, we will call you to review the results. ? ? ?Testing/Procedures: ?Your physician has requested that you have an echocardiogram. Echocardiography is a painless test that uses sound waves to create images of your heart. It provides your doctor with information about the size and shape of your heart and how well your heart?s chambers and valves are working. This procedure takes approximately one hour. There are no restrictions for this procedure. ? ?A chest x-ray takes a picture of the organs and structures inside the chest, including the heart, lungs, and blood vessels. This test can show several things, including, whether the heart is enlarges; whether fluid is building up in the lungs; and whether pacemaker / defibrillator leads are still in place. ? ? ?Follow-Up: ?At [facility], you and your health needs are our priority.  As part of our continuing mission to provide you with exceptional heart care, we have created designated Provider Care Teams.  These Care Teams include your primary Cardiologist (physician) and Advanced Practice Providers (APPs -  Physician Assistants and Nurse Practitioners) who all work together to provide you with the care you need, when you need it. ? ?We recommend signing up for the patient portal called "MyChart".  Sign up information is provided on this After Visit Summary.  MyChart is used to connect  with patients for Virtual Visits (Telemedicine).  Patients are able to view lab/test results, encounter notes, upcoming appointments, etc.  Non-urgent messages can be sent to your provider as well.   ?To learn more about what you can do with MyChart, go to [email or web address].   ? ?Your next appointment:   ?6 month(s) ? ?The format for your next appointment:   ?In Person ? ?Provider:   ?Cristopher Peru, MD  ? ? ?Other Instructions ?Chest X-ray Instructions: ?   1. You may have this done at the [facility], located in the [place] on the 1st floor. ?   2. You do no have to have an appointment. ?   3. [address] ?       [place], [place] [address] ?       [phone] ?       Monday - Friday  8:00 am - 5:00 pm ?  ?

## 2021-09-28 LAB — CBC
Hematocrit: 40.6 % (ref 37.5–51.0)
Hemoglobin: 13.7 g/dL (ref 13.0–17.7)
MCH: 30.9 pg (ref 26.6–33.0)
MCHC: 33.7 g/dL (ref 31.5–35.7)
MCV: 91 fL (ref 79–97)
Platelets: 343 10*3/uL (ref 150–450)
RBC: 4.44 x10E6/uL (ref 4.14–5.80)
RDW: 11.2 % — ABNORMAL LOW (ref 11.6–15.4)
WBC: 10 10*3/uL (ref 3.4–10.8)

## 2021-09-28 LAB — BASIC METABOLIC PANEL
BUN/Creatinine Ratio: 8 — ABNORMAL LOW (ref 10–24)
BUN: 6 mg/dL — ABNORMAL LOW (ref 8–27)
CO2: 34 mmol/L — ABNORMAL HIGH (ref 20–29)
Calcium: 9.5 mg/dL (ref 8.6–10.2)
Chloride: 92 mmol/L — ABNORMAL LOW (ref 96–106)
Creatinine, Ser: 0.75 mg/dL — ABNORMAL LOW (ref 0.76–1.27)
Glucose: 159 mg/dL — ABNORMAL HIGH (ref 70–99)
Potassium: 3.7 mmol/L (ref 3.5–5.2)
Sodium: 140 mmol/L (ref 134–144)
eGFR: 96 mL/min/{1.73_m2} (ref >59)

## 2021-09-28 LAB — PRO B NATRIURETIC PEPTIDE: NT-Pro BNP: 742 pg/mL — ABNORMAL HIGH (ref 0–376)

## 2021-10-07 ENCOUNTER — Ambulatory Visit (INDEPENDENT_AMBULATORY_CARE_PROVIDER_SITE_OTHER): Payer: Medicare Other

## 2021-10-07 ENCOUNTER — Other Ambulatory Visit: Payer: Self-pay

## 2021-10-07 DIAGNOSIS — I5022 Chronic systolic (congestive) heart failure: Secondary | ICD-10-CM | POA: Diagnosis not present

## 2021-10-07 DIAGNOSIS — I428 Other cardiomyopathies: Secondary | ICD-10-CM | POA: Diagnosis not present

## 2021-10-07 DIAGNOSIS — Z95810 Presence of automatic (implantable) cardiac defibrillator: Secondary | ICD-10-CM

## 2021-10-07 DIAGNOSIS — I482 Chronic atrial fibrillation, unspecified: Secondary | ICD-10-CM

## 2021-10-07 DIAGNOSIS — R918 Other nonspecific abnormal finding of lung field: Secondary | ICD-10-CM | POA: Diagnosis not present

## 2021-10-07 DIAGNOSIS — M47814 Spondylosis without myelopathy or radiculopathy, thoracic region: Secondary | ICD-10-CM | POA: Diagnosis not present

## 2021-10-07 LAB — ECHOCARDIOGRAM COMPLETE
Area-P 1/2: 3.53 cm2
Calc EF: 46.4 %
P 1/2 time: 660 msec
S' Lateral: 4.5 cm
Single Plane A2C EF: 49.9 %
Single Plane A4C EF: 43.6 %

## 2021-10-15 ENCOUNTER — Telehealth: Payer: Self-pay

## 2021-10-15 NOTE — Telephone Encounter (Signed)
Chest x-ray was ordered on 10/07/21 and completed at [facility]. ?

## 2021-10-15 NOTE — Telephone Encounter (Signed)
-----   Message from Richardo Priest, MD sent at 09/27/2021  1:03 PM EST ----- ?Regarding: FW: Chest Xray ? ?----- Message ----- ?From: Louretta Shorten, April, [place] ?Sent: 09/27/2021  12:55 PM EST ?To: Richardo Priest, MD, Anselm Pancoast, CMA ?Subject: Chest Xray                                    ? ?Oda Kilts, PA seen this pt today at [facility]. He ordered him to have a Chest X-ray to check his ICD lead placements. We normally send our patients to [facility] but the patient refused to have this done in [place]. He insist on having this done at [facility]. We don't have the correct forms here to order that for him.  ?He is scheduled to have an Echo done in your [place] office on 3/13 and I was wondering if you would mind giving him an order to have the Chest X-ray done at [facility] while he is there.  ? ?Thank you so much, ?April G. ? ?Abbie, I sent this to you because you are covering him that day. Please let me know either way! ? ? ?

## 2021-10-21 DIAGNOSIS — Z20822 Contact with and (suspected) exposure to covid-19: Secondary | ICD-10-CM | POA: Diagnosis not present

## 2021-10-24 DIAGNOSIS — Z20822 Contact with and (suspected) exposure to covid-19: Secondary | ICD-10-CM | POA: Diagnosis not present

## 2021-11-08 DIAGNOSIS — Z20822 Contact with and (suspected) exposure to covid-19: Secondary | ICD-10-CM | POA: Diagnosis not present

## 2021-11-14 DIAGNOSIS — Z20822 Contact with and (suspected) exposure to covid-19: Secondary | ICD-10-CM | POA: Diagnosis not present

## 2021-11-25 DIAGNOSIS — H35372 Puckering of macula, left eye: Secondary | ICD-10-CM | POA: Diagnosis not present

## 2021-11-25 DIAGNOSIS — H25813 Combined forms of age-related cataract, bilateral: Secondary | ICD-10-CM | POA: Diagnosis not present

## 2021-11-29 DIAGNOSIS — Z20822 Contact with and (suspected) exposure to covid-19: Secondary | ICD-10-CM | POA: Diagnosis not present

## 2021-12-09 ENCOUNTER — Other Ambulatory Visit: Payer: Self-pay | Admitting: Cardiology

## 2021-12-09 DIAGNOSIS — Z6834 Body mass index (BMI) 34.0-34.9, adult: Secondary | ICD-10-CM | POA: Diagnosis not present

## 2021-12-09 DIAGNOSIS — I4891 Unspecified atrial fibrillation: Secondary | ICD-10-CM | POA: Diagnosis not present

## 2021-12-09 DIAGNOSIS — G4733 Obstructive sleep apnea (adult) (pediatric): Secondary | ICD-10-CM | POA: Diagnosis not present

## 2021-12-09 DIAGNOSIS — I10 Essential (primary) hypertension: Secondary | ICD-10-CM | POA: Diagnosis not present

## 2021-12-09 DIAGNOSIS — E669 Obesity, unspecified: Secondary | ICD-10-CM | POA: Diagnosis not present

## 2021-12-09 DIAGNOSIS — I5022 Chronic systolic (congestive) heart failure: Secondary | ICD-10-CM | POA: Diagnosis not present

## 2021-12-09 DIAGNOSIS — I110 Hypertensive heart disease with heart failure: Secondary | ICD-10-CM | POA: Diagnosis not present

## 2021-12-09 DIAGNOSIS — J9611 Chronic respiratory failure with hypoxia: Secondary | ICD-10-CM | POA: Diagnosis not present

## 2021-12-09 DIAGNOSIS — E1169 Type 2 diabetes mellitus with other specified complication: Secondary | ICD-10-CM | POA: Diagnosis not present

## 2021-12-09 DIAGNOSIS — Z7901 Long term (current) use of anticoagulants: Secondary | ICD-10-CM | POA: Diagnosis not present

## 2021-12-09 DIAGNOSIS — J449 Chronic obstructive pulmonary disease, unspecified: Secondary | ICD-10-CM | POA: Diagnosis not present

## 2021-12-09 DIAGNOSIS — E7800 Pure hypercholesterolemia, unspecified: Secondary | ICD-10-CM | POA: Diagnosis not present

## 2021-12-18 ENCOUNTER — Ambulatory Visit (INDEPENDENT_AMBULATORY_CARE_PROVIDER_SITE_OTHER): Payer: Medicare Other

## 2021-12-18 DIAGNOSIS — I428 Other cardiomyopathies: Secondary | ICD-10-CM | POA: Diagnosis not present

## 2021-12-18 LAB — CUP PACEART REMOTE DEVICE CHECK
Battery Remaining Longevity: 14 mo
Battery Voltage: 2.91 V
Brady Statistic AP VP Percent: 0 %
Brady Statistic AP VS Percent: 0 %
Brady Statistic AS VP Percent: 0 %
Brady Statistic AS VS Percent: 0 %
Brady Statistic RA Percent Paced: 0 %
Brady Statistic RV Percent Paced: 99.8 %
Date Time Interrogation Session: 20230524001803
HighPow Impedance: 88 Ohm
Implantable Lead Implant Date: 20200103
Implantable Lead Implant Date: 20200103
Implantable Lead Location: 753858
Implantable Lead Location: 753860
Implantable Lead Model: 4598
Implantable Lead Model: 6935
Implantable Pulse Generator Implant Date: 20200103
Lead Channel Impedance Value: 151.406
Lead Channel Impedance Value: 161.5 Ohm
Lead Channel Impedance Value: 172.5 Ohm
Lead Channel Impedance Value: 185.725
Lead Channel Impedance Value: 185.725
Lead Channel Impedance Value: 285 Ohm
Lead Channel Impedance Value: 323 Ohm
Lead Channel Impedance Value: 323 Ohm
Lead Channel Impedance Value: 399 Ohm
Lead Channel Impedance Value: 4047 Ohm
Lead Channel Impedance Value: 437 Ohm
Lead Channel Impedance Value: 456 Ohm
Lead Channel Impedance Value: 513 Ohm
Lead Channel Impedance Value: 551 Ohm
Lead Channel Impedance Value: 570 Ohm
Lead Channel Impedance Value: 646 Ohm
Lead Channel Impedance Value: 646 Ohm
Lead Channel Impedance Value: 665 Ohm
Lead Channel Pacing Threshold Amplitude: 0.5 V
Lead Channel Pacing Threshold Amplitude: 3 V
Lead Channel Pacing Threshold Pulse Width: 0.4 ms
Lead Channel Pacing Threshold Pulse Width: 1 ms
Lead Channel Sensing Intrinsic Amplitude: 10.5 mV
Lead Channel Sensing Intrinsic Amplitude: 10.5 mV
Lead Channel Setting Pacing Amplitude: 2.5 V
Lead Channel Setting Pacing Amplitude: 3.25 V
Lead Channel Setting Pacing Pulse Width: 0.4 ms
Lead Channel Setting Pacing Pulse Width: 1 ms
Lead Channel Setting Sensing Sensitivity: 0.3 mV

## 2021-12-25 DIAGNOSIS — Z7901 Long term (current) use of anticoagulants: Secondary | ICD-10-CM | POA: Diagnosis not present

## 2021-12-31 NOTE — Progress Notes (Signed)
Remote ICD transmission.   

## 2022-01-29 DIAGNOSIS — Z7901 Long term (current) use of anticoagulants: Secondary | ICD-10-CM | POA: Diagnosis not present

## 2022-02-19 DIAGNOSIS — Z7901 Long term (current) use of anticoagulants: Secondary | ICD-10-CM | POA: Diagnosis not present

## 2022-03-12 DIAGNOSIS — E7800 Pure hypercholesterolemia, unspecified: Secondary | ICD-10-CM | POA: Diagnosis not present

## 2022-03-12 DIAGNOSIS — I482 Chronic atrial fibrillation, unspecified: Secondary | ICD-10-CM | POA: Diagnosis not present

## 2022-03-12 DIAGNOSIS — Z7901 Long term (current) use of anticoagulants: Secondary | ICD-10-CM | POA: Diagnosis not present

## 2022-03-12 DIAGNOSIS — J449 Chronic obstructive pulmonary disease, unspecified: Secondary | ICD-10-CM | POA: Diagnosis not present

## 2022-03-12 DIAGNOSIS — E669 Obesity, unspecified: Secondary | ICD-10-CM | POA: Diagnosis not present

## 2022-03-12 DIAGNOSIS — I10 Essential (primary) hypertension: Secondary | ICD-10-CM | POA: Diagnosis not present

## 2022-03-12 DIAGNOSIS — Z6833 Body mass index (BMI) 33.0-33.9, adult: Secondary | ICD-10-CM | POA: Diagnosis not present

## 2022-03-12 DIAGNOSIS — E1169 Type 2 diabetes mellitus with other specified complication: Secondary | ICD-10-CM | POA: Diagnosis not present

## 2022-03-19 ENCOUNTER — Ambulatory Visit (INDEPENDENT_AMBULATORY_CARE_PROVIDER_SITE_OTHER): Payer: Medicare Other

## 2022-03-19 DIAGNOSIS — I428 Other cardiomyopathies: Secondary | ICD-10-CM | POA: Diagnosis not present

## 2022-03-19 DIAGNOSIS — I5022 Chronic systolic (congestive) heart failure: Secondary | ICD-10-CM

## 2022-03-20 LAB — CUP PACEART REMOTE DEVICE CHECK
Battery Remaining Longevity: 12 mo
Battery Voltage: 2.89 V
Brady Statistic AP VP Percent: 0 %
Brady Statistic AP VS Percent: 0 %
Brady Statistic AS VP Percent: 0 %
Brady Statistic AS VS Percent: 0 %
Brady Statistic RA Percent Paced: 0 %
Brady Statistic RV Percent Paced: 99.71 %
Date Time Interrogation Session: 20230823012504
HighPow Impedance: 87 Ohm
Implantable Lead Implant Date: 20200103
Implantable Lead Implant Date: 20200103
Implantable Lead Location: 753858
Implantable Lead Location: 753860
Implantable Lead Model: 4598
Implantable Lead Model: 6935
Implantable Pulse Generator Implant Date: 20200103
Lead Channel Impedance Value: 151.406
Lead Channel Impedance Value: 161.5 Ohm
Lead Channel Impedance Value: 172.5 Ohm
Lead Channel Impedance Value: 185.725
Lead Channel Impedance Value: 185.725
Lead Channel Impedance Value: 285 Ohm
Lead Channel Impedance Value: 323 Ohm
Lead Channel Impedance Value: 323 Ohm
Lead Channel Impedance Value: 399 Ohm
Lead Channel Impedance Value: 4047 Ohm
Lead Channel Impedance Value: 437 Ohm
Lead Channel Impedance Value: 456 Ohm
Lead Channel Impedance Value: 494 Ohm
Lead Channel Impedance Value: 513 Ohm
Lead Channel Impedance Value: 513 Ohm
Lead Channel Impedance Value: 608 Ohm
Lead Channel Impedance Value: 608 Ohm
Lead Channel Impedance Value: 646 Ohm
Lead Channel Pacing Threshold Amplitude: 0.5 V
Lead Channel Pacing Threshold Amplitude: 3.75 V
Lead Channel Pacing Threshold Pulse Width: 0.4 ms
Lead Channel Pacing Threshold Pulse Width: 1 ms
Lead Channel Sensing Intrinsic Amplitude: 18.125 mV
Lead Channel Sensing Intrinsic Amplitude: 18.125 mV
Lead Channel Setting Pacing Amplitude: 2.5 V
Lead Channel Setting Pacing Amplitude: 3.25 V
Lead Channel Setting Pacing Pulse Width: 0.4 ms
Lead Channel Setting Pacing Pulse Width: 1 ms
Lead Channel Setting Sensing Sensitivity: 0.3 mV

## 2022-03-25 DIAGNOSIS — H2513 Age-related nuclear cataract, bilateral: Secondary | ICD-10-CM | POA: Diagnosis not present

## 2022-03-25 DIAGNOSIS — H40033 Anatomical narrow angle, bilateral: Secondary | ICD-10-CM | POA: Diagnosis not present

## 2022-03-25 DIAGNOSIS — H35372 Puckering of macula, left eye: Secondary | ICD-10-CM | POA: Diagnosis not present

## 2022-04-03 ENCOUNTER — Encounter: Payer: Medicare Other | Admitting: Internal Medicine

## 2022-04-04 NOTE — Progress Notes (Unsigned)
Electrophysiology Office Note Date: 04/04/2022  ID:  Jaime Scott, DOB 10/07/1950, MRN [id number]  PCP: Greig Right, MD Primary Cardiologist: None Electrophysiologist: Cristopher Peru, MD   CC: Routine ICD follow-up  Jaime Scott is a 71 y.o. male seen today for Cristopher Peru, MD for routine electrophysiology followup.  Since last being seen in our clinic the patient reports doing ***.  he denies chest pain, palpitations, dyspnea, PND, orthopnea, nausea, vomiting, dizziness, syncope, edema, weight gain, or early satiety. He has not had ICD shocks.   Device History: Medtronic BiV ICD implanted 2017, AV nodal ablation and CRT upgrade 2020 for CHF and tachy brady  Past Medical History:  Diagnosis Date   Alcohol abuse    Arthritis    Atrial fibrillation ([place]) 08/02/2018   CHF (congestive heart failure) ([place]) 03/12/2017   Chronic atrial fibrillation (HCC) 03/12/2017   Chronic respiratory failure (HCC)    COPD (chronic obstructive pulmonary disease) ([place]) 03/12/2017   Dyspnea    wears Oxygen 24/7 3 L   Essential hypertension 03/12/2017   High risk medication use 05/05/2017   Hyperlipidemia 03/12/2017   Hypertensive heart disease with heart failure ([place]) 03/12/2017   ICD (implantable cardioverter-defibrillator) in place 07/30/2018   Ischemic cardiomyopathy 03/12/2017   Long term (current) use of anticoagulants 03/12/2017   Peripheral vascular disease ([place]) 03/12/2017   Preoperative cardiovascular examination 03/25/2015   Presence of combination internal cardiac defibrillator (ICD) and pacemaker 03/12/2017   unable to place a CRT    Type 2 diabetes mellitus without complications ([place]) 07/28/7508   Past Surgical History:  Procedure Laterality Date   AV NODE ABLATION N/A 08/02/2018   Procedure: AV NODE ABLATION;  Surgeon: Evans Lance, MD;  Location: [facility];  Service: Cardiovascular;  Laterality: N/A;   BIV ICD INSERTION CRT-D N/A 07/30/2018   Procedure: BIV ICD INSERTION  CRT-D;  Surgeon: Evans Lance, MD;  Location: [facility];  Service: Cardiovascular;  Laterality: N/A;   BIV PACEMAKER GENERATOR CHANGEOUT N/A 05/27/2018   Procedure: BIV PACEMAKER GENERATOR UPGRADE - Aborted;  Surgeon: Constance Haw, MD;  Location: [facility];  Service: Cardiovascular;  Laterality: N/A;   CHOLECYSTECTOMY     COLONOSCOPY     ERCP     stone removal   EXCISION OF BREAST BIOPSY     Age 15, benign   PACEMAKER LEAD REMOVAL N/A 07/30/2018   Procedure: PACEMAKER LEAD REMOVAL;  Surgeon: Evans Lance, MD;  [facility];  Service: Cardiovascular;  Laterality: N/A;    Current Outpatient Medications  Medication Sig Dispense Refill   enalapril (VASOTEC) 5 MG tablet Take 1 tablet (5 mg total) by mouth daily. (Patient taking differently: Take 2.5 mg by mouth daily.) 90 tablet 2   eplerenone (INSPRA) 25 MG tablet Take 1 tablet (25 mg total) by mouth daily. 90 tablet 2   furosemide (LASIX) 80 MG tablet Take 80 mg by mouth as directed. 2 tablets in the morning and 1 tablets in the evening     metFORMIN (GLUCOPHAGE-XR) 500 MG 24 hr tablet Take 1,000 mg by mouth 2 (two) times daily.      metoprolol succinate (TOPROL-XL) 25 MG 24 hr tablet TAKE 1 TABLET BY MOUTH ONCE DAILY WITH FOOD 90 tablet 3   OXYGEN Inhale 4 L into the lungs as needed.     potassium chloride SA (K-DUR,KLOR-CON) 20 MEQ tablet Take 20 mEq by mouth every evening.      simvastatin (ZOCOR)  20 MG tablet Take 20 mg by mouth every evening.      warfarin (COUMADIN) 1 MG tablet Take 1 mg by mouth daily.      warfarin (COUMADIN) 5 MG tablet Take 5 mg by mouth daily at 8 pm.     No current facility-administered medications for this visit.    Allergies:   Ivp dye [iodinated contrast media]   Social History: Social History   Socioeconomic History   Marital status: Divorced    Spouse name: Not on file   Number of children: Not on file   Years of education: Not on file   Highest education level: Not on file   Occupational History   Not on file  Tobacco Use   Smoking status: Former    Packs/day: 1.00    Years: 61.00    Total pack years: 61.00    Types: Cigarettes    Quit date: 08/2018    Years since quitting: 3.6   Smokeless tobacco: Never  Vaping Use   Vaping Use: Never used  Substance and Sexual Activity   Alcohol use: No   Drug use: No   Sexual activity: Not on file  Other Topics Concern   Not on file  Social History Narrative   Not on file   Social Determinants of Health   Financial Resource Strain: Not on file  Food Insecurity: Not on file  Transportation Needs: Not on file  Physical Activity: Not on file  Stress: Not on file  Social Connections: Not on file  Intimate Partner Violence: Not on file    Family History: Family History  Problem Relation Age of Onset   Breast cancer Sister    Alcohol abuse Brother    Alcohol abuse Brother     Review of Systems: All other systems reviewed and are otherwise negative except as noted above.   Physical Exam: There were no vitals filed for this visit.   GEN- The patient is well appearing, alert and oriented x 3 today.   HEENT: normocephalic, atraumatic; sclera clear, conjunctiva pink; hearing intact; oropharynx clear; neck supple, no JVP Lymph- no cervical lymphadenopathy Lungs- Clear to ausculation bilaterally, normal work of breathing.  No wheezes, rales, rhonchi Heart- Regular rate and rhythm, no murmurs, rubs or gallops, PMI not laterally displaced GI- soft, non-tender, non-distended, bowel sounds present, no hepatosplenomegaly Extremities- no clubbing or cyanosis. No edema; DP/PT/radial pulses 2+ bilaterally MS- no significant deformity or atrophy Skin- warm and dry, no rash or lesion; ICD pocket well healed Psych- euthymic mood, full affect Neuro- strength and sensation are intact  ICD interrogation- reviewed in detail today,  See PACEART report  EKG:  EKG {ACTION; IS/IS [id number]} ordered today. Personal  review of EKG ordered {Blank single:19197::"today","***"} shows ***  Recent Labs: 09/27/2021: BUN 6; Creatinine, Ser 0.75; Hemoglobin 13.7; NT-Pro BNP 742; Platelets 343; Potassium 3.7; Sodium 140   Wt Readings from Last 3 Encounters:  09/27/21 229 lb 3.2 oz (104 kg)  05/03/21 233 lb 3.2 oz (105.8 kg)  10/18/20 237 lb (107.5 kg)     Other studies Reviewed: Additional studies/ records that were reviewed today include: Previous EP office notes.   Assessment and Plan:  1.  Chronic systolic dysfunction s/p Medtronic CRT-D  Echo 09/2021 LVEF 45-50% Euvolemic today Volume status *** Normal ICD function See Pace Art report Pt with very complicated lead history. Previous left sided device  Previously had elevated treshold LV1-LV2. Currently programmed LV 1 to RV coil with best threshold and  best battery life. EKG relatively unchanged and prior attempts V-V optimization was ineffective at narrowing QRS any further or making V1 more positive.  Labs today.  Previously refused entresto due to cost and no perceived benefit, and now poor candidate with hypotension.    2. Atrial fib *** Rates controlled   3. HTN Stable on current regimen    4. LV elevated threshold *** Threshold 2.25 V @ 1.00 ms. As prior, he has no suitable alternatives.  Update CXR   Current medicines are reviewed at length with the patient today.   =  Labs/ tests ordered today include: *** No orders of the defined types were placed in this encounter.    Disposition:   Follow up with {Blank single:19197::"Dr. Allred","Dr. Arlan Organ. Klein","Dr. Camnitz","Dr. Lambert","EP APP"} in {Blank single:19197::"2 weeks","4 weeks","3 months","6 months","12 months","as usual post gen change"}    Signed, Annamaria Helling  04/04/2022 9:36 PM  [place] [address] [phone] (office) [phone] (fax)

## 2022-04-07 ENCOUNTER — Encounter: Payer: Self-pay | Admitting: Student

## 2022-04-07 ENCOUNTER — Ambulatory Visit: Payer: Medicare Other | Attending: Internal Medicine | Admitting: Student

## 2022-04-07 VITALS — BP 90/58 | HR 80 | Ht 67.0 in | Wt 209.0 lb

## 2022-04-07 DIAGNOSIS — I10 Essential (primary) hypertension: Secondary | ICD-10-CM | POA: Diagnosis not present

## 2022-04-07 DIAGNOSIS — I482 Chronic atrial fibrillation, unspecified: Secondary | ICD-10-CM | POA: Diagnosis not present

## 2022-04-07 DIAGNOSIS — I5022 Chronic systolic (congestive) heart failure: Secondary | ICD-10-CM | POA: Diagnosis not present

## 2022-04-07 DIAGNOSIS — Z7901 Long term (current) use of anticoagulants: Secondary | ICD-10-CM | POA: Insufficient documentation

## 2022-04-07 DIAGNOSIS — I428 Other cardiomyopathies: Secondary | ICD-10-CM | POA: Insufficient documentation

## 2022-04-07 LAB — CUP PACEART INCLINIC DEVICE CHECK
Battery Remaining Longevity: 11 mo
Battery Voltage: 2.89 V
Brady Statistic AP VP Percent: 0 %
Brady Statistic AP VS Percent: 0 %
Brady Statistic AS VP Percent: 0 %
Brady Statistic AS VS Percent: 0 %
Brady Statistic RA Percent Paced: 0 %
Brady Statistic RV Percent Paced: 99.75 %
Date Time Interrogation Session: 20230911081534
HighPow Impedance: 83 Ohm
Implantable Lead Implant Date: 20200103
Implantable Lead Implant Date: 20200103
Implantable Lead Location: 753858
Implantable Lead Location: 753860
Implantable Lead Model: 4598
Implantable Lead Model: 6935
Implantable Pulse Generator Implant Date: 20200103
Lead Channel Impedance Value: 137.586
Lead Channel Impedance Value: 137.586
Lead Channel Impedance Value: 165.351
Lead Channel Impedance Value: 165.351
Lead Channel Impedance Value: 172.5 Ohm
Lead Channel Impedance Value: 266 Ohm
Lead Channel Impedance Value: 266 Ohm
Lead Channel Impedance Value: 285 Ohm
Lead Channel Impedance Value: 342 Ohm
Lead Channel Impedance Value: 4047 Ohm
Lead Channel Impedance Value: 437 Ohm
Lead Channel Impedance Value: 437 Ohm
Lead Channel Impedance Value: 456 Ohm
Lead Channel Impedance Value: 494 Ohm
Lead Channel Impedance Value: 513 Ohm
Lead Channel Impedance Value: 513 Ohm
Lead Channel Impedance Value: 513 Ohm
Lead Channel Impedance Value: 551 Ohm
Lead Channel Pacing Threshold Amplitude: 0.5 V
Lead Channel Pacing Threshold Amplitude: 3.5 V
Lead Channel Pacing Threshold Pulse Width: 0.4 ms
Lead Channel Pacing Threshold Pulse Width: 1 ms
Lead Channel Sensing Intrinsic Amplitude: 18.125 mV
Lead Channel Sensing Intrinsic Amplitude: 7.75 mV
Lead Channel Setting Pacing Amplitude: 2.5 V
Lead Channel Setting Pacing Amplitude: 3.25 V
Lead Channel Setting Pacing Pulse Width: 0.4 ms
Lead Channel Setting Pacing Pulse Width: 1 ms
Lead Channel Setting Sensing Sensitivity: 0.3 mV

## 2022-04-07 NOTE — Patient Instructions (Signed)
Medication Instructions:  Your physician recommends that you continue on your current medications as directed. Please refer to the Current Medication list given to you today.  *If you need a refill on your cardiac medications before your next appointment, please call your pharmacy*   Lab Work: TODAY: BMET, CBC  If you have labs (blood work) drawn today and your tests are completely normal, you will receive your results only by: [place] (if you have MyChart) OR A paper copy in the mail If you have any lab test that is abnormal or we need to change your treatment, we will call you to review the results.   Follow-Up: At [facility], you and your health needs are our priority.  As part of our continuing mission to provide you with exceptional heart care, we have created designated Provider Care Teams.  These Care Teams include your primary Cardiologist (physician) and Advanced Practice Providers (APPs -  Physician Assistants and Nurse Practitioners) who all work together to provide you with the care you need, when you need it.  We recommend signing up for the patient portal called "MyChart".  Sign up information is provided on this After Visit Summary.  MyChart is used to connect with patients for Virtual Visits (Telemedicine).  Patients are able to view lab/test results, encounter notes, upcoming appointments, etc.  Non-urgent messages can be sent to your provider as well.   To learn more about what you can do with MyChart, go to [email or web address].    Your next appointment:   6 month(s)  The format for your next appointment:   In Person  Provider:   Cristopher Peru, MD

## 2022-04-08 DIAGNOSIS — Z79899 Other long term (current) drug therapy: Secondary | ICD-10-CM | POA: Diagnosis not present

## 2022-04-08 DIAGNOSIS — H15103 Unspecified episcleritis, bilateral: Secondary | ICD-10-CM | POA: Diagnosis not present

## 2022-04-08 DIAGNOSIS — H2511 Age-related nuclear cataract, right eye: Secondary | ICD-10-CM | POA: Diagnosis not present

## 2022-04-08 DIAGNOSIS — E119 Type 2 diabetes mellitus without complications: Secondary | ICD-10-CM | POA: Diagnosis not present

## 2022-04-08 DIAGNOSIS — H35372 Puckering of macula, left eye: Secondary | ICD-10-CM | POA: Diagnosis not present

## 2022-04-08 DIAGNOSIS — I4891 Unspecified atrial fibrillation: Secondary | ICD-10-CM | POA: Diagnosis not present

## 2022-04-08 DIAGNOSIS — E1136 Type 2 diabetes mellitus with diabetic cataract: Secondary | ICD-10-CM | POA: Diagnosis not present

## 2022-04-08 DIAGNOSIS — Z7984 Long term (current) use of oral hypoglycemic drugs: Secondary | ICD-10-CM | POA: Diagnosis not present

## 2022-04-08 DIAGNOSIS — I10 Essential (primary) hypertension: Secondary | ICD-10-CM | POA: Diagnosis not present

## 2022-04-08 DIAGNOSIS — H259 Unspecified age-related cataract: Secondary | ICD-10-CM | POA: Diagnosis not present

## 2022-04-08 DIAGNOSIS — H40033 Anatomical narrow angle, bilateral: Secondary | ICD-10-CM | POA: Diagnosis not present

## 2022-04-08 DIAGNOSIS — Z7901 Long term (current) use of anticoagulants: Secondary | ICD-10-CM | POA: Diagnosis not present

## 2022-04-08 DIAGNOSIS — H25811 Combined forms of age-related cataract, right eye: Secondary | ICD-10-CM | POA: Diagnosis not present

## 2022-04-08 LAB — BASIC METABOLIC PANEL
BUN/Creatinine Ratio: 9 — ABNORMAL LOW (ref 10–24)
BUN: 6 mg/dL — ABNORMAL LOW (ref 8–27)
CO2: 33 mmol/L — ABNORMAL HIGH (ref 20–29)
Calcium: 9.4 mg/dL (ref 8.6–10.2)
Chloride: 97 mmol/L (ref 96–106)
Creatinine, Ser: 0.66 mg/dL — ABNORMAL LOW (ref 0.76–1.27)
Glucose: 126 mg/dL — ABNORMAL HIGH (ref 70–99)
Potassium: 4.2 mmol/L (ref 3.5–5.2)
Sodium: 143 mmol/L (ref 134–144)
eGFR: 100 mL/min/{1.73_m2} (ref >59)

## 2022-04-08 LAB — CBC
Hematocrit: 37.5 % (ref 37.5–51.0)
Hemoglobin: 12.3 g/dL — ABNORMAL LOW (ref 13.0–17.7)
MCH: 30.8 pg (ref 26.6–33.0)
MCHC: 32.8 g/dL (ref 31.5–35.7)
MCV: 94 fL (ref 79–97)
Platelets: 319 10*3/uL (ref 150–450)
RBC: 3.99 x10E6/uL — ABNORMAL LOW (ref 4.14–5.80)
RDW: 12 % (ref 11.6–15.4)
WBC: 8.5 10*3/uL (ref 3.4–10.8)

## 2022-04-15 DIAGNOSIS — H2512 Age-related nuclear cataract, left eye: Secondary | ICD-10-CM | POA: Diagnosis not present

## 2022-04-15 DIAGNOSIS — Z01818 Encounter for other preprocedural examination: Secondary | ICD-10-CM | POA: Diagnosis not present

## 2022-04-15 NOTE — Progress Notes (Signed)
Remote ICD transmission.   

## 2022-04-16 DIAGNOSIS — Z7901 Long term (current) use of anticoagulants: Secondary | ICD-10-CM | POA: Diagnosis not present

## 2022-04-17 ENCOUNTER — Ambulatory Visit: Payer: Medicare Other | Attending: Cardiology | Admitting: Cardiology

## 2022-04-17 ENCOUNTER — Encounter: Payer: Self-pay | Admitting: Cardiology

## 2022-04-17 VITALS — BP 104/68 | HR 84 | Ht 67.0 in | Wt 212.4 lb

## 2022-04-17 DIAGNOSIS — E782 Mixed hyperlipidemia: Secondary | ICD-10-CM | POA: Diagnosis not present

## 2022-04-17 DIAGNOSIS — Z95810 Presence of automatic (implantable) cardiac defibrillator: Secondary | ICD-10-CM | POA: Diagnosis not present

## 2022-04-17 DIAGNOSIS — I428 Other cardiomyopathies: Secondary | ICD-10-CM | POA: Insufficient documentation

## 2022-04-17 DIAGNOSIS — I5022 Chronic systolic (congestive) heart failure: Secondary | ICD-10-CM | POA: Diagnosis not present

## 2022-04-17 DIAGNOSIS — I110 Hypertensive heart disease with heart failure: Secondary | ICD-10-CM | POA: Insufficient documentation

## 2022-04-17 DIAGNOSIS — J449 Chronic obstructive pulmonary disease, unspecified: Secondary | ICD-10-CM | POA: Insufficient documentation

## 2022-04-17 DIAGNOSIS — Z7901 Long term (current) use of anticoagulants: Secondary | ICD-10-CM | POA: Insufficient documentation

## 2022-04-17 DIAGNOSIS — I482 Chronic atrial fibrillation, unspecified: Secondary | ICD-10-CM | POA: Insufficient documentation

## 2022-04-17 NOTE — Progress Notes (Signed)
Cardiology Office Note:    Date:  04/17/2022   ID:  Jaime Scott, DOB September 03, 1950, MRN [id number]  PCP:  Greig Right, MD  Cardiologist:  Shirlee More, MD    Referring MD: Greig Right, MD    ASSESSMENT:    1. Chronic atrial fibrillation ([place])   2. Long term (current) use of anticoagulants   3. Nonischemic cardiomyopathy ([place])   4. Chronic systolic heart failure ([place])   5. ICD (implantable cardioverter-defibrillator) in place   6. Hypertensive heart disease with chronic systolic congestive heart failure ([place])   7. Mixed hyperlipidemia   8. Chronic obstructive pulmonary disease, unspecified COPD type ([place])    PLAN:    In order of problems listed above:  Jaime Scott is doing well with atrial fibrillation rate controlled on his beta-blocker presently on warfarin intends to transition to direct anticoagulant.  He has had AV nodal ablation and biventricular pacemaker for rate control Stable heart failure and cardiomyopathy he is on maximally tolerated beta-blocker ACE inhibitor and loop diuretic along with MRA.  I will continue his current medical treatment that is worked well and been effective Stable ICD therapy followed in our device clinic his last download 04/07/2022 he has had no VT or VF therapy and he is biventricular paced 99% of the time. LDL is at target continue his statin Stable COPD managed by his PCP   Next appointment: 6 months   Medication Adjustments/Labs and Tests Ordered: Current medicines are reviewed at length with the patient today.  Concerns regarding medicines are outlined above.  No orders of the defined types were placed in this encounter.  No orders of the defined types were placed in this encounter.   Chief Complaint  Patient presents with   Follow-up   Atrial Fibrillation   Congestive Heart Failure    History of Present Illness:    Jaime Scott is a 71 y.o. male with a hx of chronic atrial fibrillation with long-term anticoagulation  he has chronic systolic heart failure and an ICD hypertensive heart disease and COPD last seen 05/03/2021 added to my schedule today for routine follow-up.  Compliance with diet, lifestyle and medications: Yes  He purposely has lost 50 pounds and is very pleased with the quality of his life he has stable shortness of breath with ambulatory oxygen no edema orthopnea chest pain palpitation or syncope From his description he is going to transition from warfarin to a direct anticoagulant medications through his PCP office pharmacy He has had no bleeding complication from warfarin Recent labs 03/12/2022 cholesterol 156 LDL 64 A1c 6.5 hemoglobin 14.8 creatinine 0.66 potassium 4.2  His echocardiogram March of this year showed mildly reduced EF 45 to 50% mildly dilated right ventricle.  Right ventricle is normal size function and pulmonary pressure both atria were enlarged and he had no significant valvular abnormality. Past Medical History:  Diagnosis Date   Alcohol abuse    Arthritis    Atrial fibrillation ([place]) 08/02/2018   CHF (congestive heart failure) ([place]) 03/12/2017   Chronic atrial fibrillation ([place]) 03/12/2017   Chronic respiratory failure ([facility])    COPD (chronic obstructive pulmonary disease) ([place]) 03/12/2017   Dyspnea    wears Oxygen 24/7 3 L   Essential hypertension 03/12/2017   High risk medication use 05/05/2017   Hyperlipidemia 03/12/2017   Hypertensive heart disease with heart failure ([place]) 03/12/2017   ICD (implantable cardioverter-defibrillator) in place 07/30/2018   Ischemic cardiomyopathy 03/12/2017   Long term (current) use of anticoagulants 03/12/2017  Peripheral vascular disease ([place]) 03/12/2017   Preoperative cardiovascular examination 03/25/2015   Presence of combination internal cardiac defibrillator (ICD) and pacemaker 03/12/2017   unable to place a CRT    Type 2 diabetes mellitus without complications ([place]) 11/28/5463    Past Surgical History:  Procedure Laterality Date   AV  NODE ABLATION N/A 08/02/2018   Procedure: AV NODE ABLATION;  Surgeon: Evans Lance, MD;  Location: [facility];  Service: Cardiovascular;  Laterality: N/A;   BIV ICD INSERTION CRT-D N/A 07/30/2018   Procedure: BIV ICD INSERTION CRT-D;  Surgeon: Evans Lance, MD;  Location: [facility];  Service: Cardiovascular;  Laterality: N/A;   BIV PACEMAKER GENERATOR CHANGEOUT N/A 05/27/2018   Procedure: BIV PACEMAKER GENERATOR UPGRADE - Aborted;  Surgeon: Constance Haw, MD;  Location: [facility];  Service: Cardiovascular;  Laterality: N/A;   CHOLECYSTECTOMY     COLONOSCOPY     ERCP     stone removal   EXCISION OF BREAST BIOPSY     Age 46, benign   PACEMAKER LEAD REMOVAL N/A 07/30/2018   Procedure: PACEMAKER LEAD REMOVAL;  Surgeon: Evans Lance, MD;  Location: [facility];  Service: Cardiovascular;  Laterality: N/A;    Current Medications: Current Meds  Medication Sig   enalapril (VASOTEC) 5 MG tablet Take 1 tablet (5 mg total) by mouth daily. (Patient taking differently: Take 2.5 mg by mouth daily.)   eplerenone (INSPRA) 25 MG tablet Take 1 tablet (25 mg total) by mouth daily.   furosemide (LASIX) 80 MG tablet Take 80 mg by mouth as directed. 2 tablets in the morning and 1 tablets in the evening   metFORMIN (GLUCOPHAGE-XR) 500 MG 24 hr tablet Take 1,000 mg by mouth 2 (two) times daily.    metoprolol succinate (TOPROL-XL) 25 MG 24 hr tablet TAKE 1 TABLET BY MOUTH ONCE DAILY WITH FOOD   OXYGEN Inhale 4 L into the lungs as needed.   potassium chloride SA (K-DUR,KLOR-CON) 20 MEQ tablet Take 20 mEq by mouth every evening.    simvastatin (ZOCOR) 20 MG tablet Take 20 mg by mouth every evening.    warfarin (COUMADIN) 1 MG tablet Take 1 mg by mouth daily.    warfarin (COUMADIN) 5 MG tablet Take 5 mg by mouth daily at 8 pm.     Allergies:   Ivp dye [iodinated contrast media]   Social History   Socioeconomic History   Marital status: Divorced    Spouse name: Not on file   Number of  children: Not on file   Years of education: Not on file   Highest education level: Not on file  Occupational History   Not on file  Tobacco Use   Smoking status: Former    Packs/day: 1.00    Years: 61.00    Total pack years: 61.00    Types: Cigarettes    Quit date: 08/2018    Years since quitting: 3.6   Smokeless tobacco: Never  Vaping Use   Vaping Use: Never used  Substance and Sexual Activity   Alcohol use: No   Drug use: No   Sexual activity: Not on file  Other Topics Concern   Not on file  Social History Narrative   Not on file   Social Determinants of Health   Financial Resource Strain: Not on file  Food Insecurity: Not on file  Transportation Needs: Not on file  Physical Activity: Not on file  Stress: Not on file  Social Connections:  Not on file     Family History: The patient's family history includes Alcohol abuse in his brother and brother; Breast cancer in his sister. ROS:   Please see the history of present illness.    All other systems reviewed and are negative.  EKGs/Labs/Other Studies Reviewed:    The following studies were reviewed today:    Physical Exam:    VS:  BP 104/68 (BP Location: Left Arm, Patient Position: Sitting)   Pulse 84   Ht '5\' 7"'$  (1.702 m)   Wt 212 lb 6.4 oz (96.3 kg)   SpO2 95% Comment: ON 4 LITERS [place]  BMI 33.27 kg/m     Wt Readings from Last 3 Encounters:  04/17/22 212 lb 6.4 oz (96.3 kg)  04/07/22 209 lb (94.8 kg)  09/27/21 229 lb 3.2 oz (104 kg)     GEN:  Well nourished, well developed in no acute distress HEENT: Normal NECK: No JVD; No carotid bruits LYMPHATICS: No lymphadenopathy CARDIAC: Irregular rate and rhythm no murmurs, rubs, gallops RESPIRATORY:  Clear to auscultation without rales, wheezing or rhonchi  ABDOMEN: Soft, non-tender, non-distended MUSCULOSKELETAL:  No edema; No deformity  SKIN: Warm and dry NEUROLOGIC:  Alert and oriented x 3 PSYCHIATRIC:  Normal affect    Signed, Shirlee More, MD   04/17/2022 11:27 AM    [place]

## 2022-04-17 NOTE — Patient Instructions (Signed)
Medication Instructions:  Your physician recommends that you continue on your current medications as directed. Please refer to the Current Medication list given to you today.  *If you need a refill on your cardiac medications before your next appointment, please call your pharmacy*   Lab Work: None If you have labs (blood work) drawn today and your tests are completely normal, you will receive your results only by: MyChart Message (if you have MyChart) OR A paper copy in the mail If you have any lab test that is abnormal or we need to change your treatment, we will call you to review the results.   Testing/Procedures: None   Follow-Up: At [facility], you and your health needs are our priority.  As part of our continuing mission to provide you with exceptional heart care, we have created designated Provider Care Teams.  These Care Teams include your primary Cardiologist (physician) and Advanced Practice Providers (APPs -  Physician Assistants and Nurse Practitioners) who all work together to provide you with the care you need, when you need it.  We recommend signing up for the patient portal called "MyChart".  Sign up information is provided on this After Visit Summary.  MyChart is used to connect with patients for Virtual Visits (Telemedicine).  Patients are able to view lab/test results, encounter notes, upcoming appointments, etc.  Non-urgent messages can be sent to your provider as well.   To learn more about what you can do with MyChart, go to [email or web address].    Your next appointment:   6 month(s)  The format for your next appointment:   In Person  Provider:   Brian Munley, MD    Other Instructions None  Important Information About Sugar       

## 2022-04-21 ENCOUNTER — Other Ambulatory Visit: Payer: Self-pay | Admitting: Cardiology

## 2022-04-22 DIAGNOSIS — I509 Heart failure, unspecified: Secondary | ICD-10-CM | POA: Diagnosis not present

## 2022-04-22 DIAGNOSIS — Z7984 Long term (current) use of oral hypoglycemic drugs: Secondary | ICD-10-CM | POA: Diagnosis not present

## 2022-04-22 DIAGNOSIS — Z7901 Long term (current) use of anticoagulants: Secondary | ICD-10-CM | POA: Diagnosis not present

## 2022-04-22 DIAGNOSIS — H25812 Combined forms of age-related cataract, left eye: Secondary | ICD-10-CM | POA: Diagnosis not present

## 2022-04-22 DIAGNOSIS — H259 Unspecified age-related cataract: Secondary | ICD-10-CM | POA: Diagnosis not present

## 2022-04-22 DIAGNOSIS — E113293 Type 2 diabetes mellitus with mild nonproliferative diabetic retinopathy without macular edema, bilateral: Secondary | ICD-10-CM | POA: Diagnosis not present

## 2022-04-22 DIAGNOSIS — H40013 Open angle with borderline findings, low risk, bilateral: Secondary | ICD-10-CM | POA: Diagnosis not present

## 2022-04-22 DIAGNOSIS — H35372 Puckering of macula, left eye: Secondary | ICD-10-CM | POA: Diagnosis not present

## 2022-04-22 DIAGNOSIS — E1136 Type 2 diabetes mellitus with diabetic cataract: Secondary | ICD-10-CM | POA: Diagnosis not present

## 2022-04-22 DIAGNOSIS — I110 Hypertensive heart disease with heart failure: Secondary | ICD-10-CM | POA: Diagnosis not present

## 2022-04-22 DIAGNOSIS — H2512 Age-related nuclear cataract, left eye: Secondary | ICD-10-CM | POA: Diagnosis not present

## 2022-04-26 DIAGNOSIS — J449 Chronic obstructive pulmonary disease, unspecified: Secondary | ICD-10-CM | POA: Diagnosis not present

## 2022-04-26 DIAGNOSIS — I10 Essential (primary) hypertension: Secondary | ICD-10-CM | POA: Diagnosis not present

## 2022-04-26 DIAGNOSIS — I482 Chronic atrial fibrillation, unspecified: Secondary | ICD-10-CM | POA: Diagnosis not present

## 2022-04-26 DIAGNOSIS — E1169 Type 2 diabetes mellitus with other specified complication: Secondary | ICD-10-CM | POA: Diagnosis not present

## 2022-05-07 ENCOUNTER — Ambulatory Visit: Payer: Medicare Other | Admitting: Cardiology

## 2022-05-09 DIAGNOSIS — I482 Chronic atrial fibrillation, unspecified: Secondary | ICD-10-CM | POA: Diagnosis not present

## 2022-05-09 DIAGNOSIS — Z7901 Long term (current) use of anticoagulants: Secondary | ICD-10-CM | POA: Diagnosis not present

## 2022-05-26 DIAGNOSIS — Z7901 Long term (current) use of anticoagulants: Secondary | ICD-10-CM | POA: Diagnosis not present

## 2022-06-18 ENCOUNTER — Ambulatory Visit (INDEPENDENT_AMBULATORY_CARE_PROVIDER_SITE_OTHER): Payer: Medicare Other

## 2022-06-18 DIAGNOSIS — I428 Other cardiomyopathies: Secondary | ICD-10-CM | POA: Diagnosis not present

## 2022-06-18 LAB — CUP PACEART REMOTE DEVICE CHECK
Battery Remaining Longevity: 10 mo
Battery Voltage: 2.87 V
Brady Statistic AP VP Percent: 0 %
Brady Statistic AP VS Percent: 0 %
Brady Statistic AS VP Percent: 0 %
Brady Statistic AS VS Percent: 0 %
Brady Statistic RA Percent Paced: 0 %
Brady Statistic RV Percent Paced: 99.79 %
Date Time Interrogation Session: 20231122001702
HighPow Impedance: 77 Ohm
Implantable Lead Connection Status: 753985
Implantable Lead Connection Status: 753985
Implantable Lead Implant Date: 20200103
Implantable Lead Implant Date: 20200103
Implantable Lead Location: 753858
Implantable Lead Location: 753860
Implantable Lead Model: 4598
Implantable Lead Model: 6935
Implantable Pulse Generator Implant Date: 20200103
Lead Channel Impedance Value: 142.5 Ohm
Lead Channel Impedance Value: 142.5 Ohm
Lead Channel Impedance Value: 162.857
Lead Channel Impedance Value: 162.857
Lead Channel Impedance Value: 162.857
Lead Channel Impedance Value: 285 Ohm
Lead Channel Impedance Value: 285 Ohm
Lead Channel Impedance Value: 285 Ohm
Lead Channel Impedance Value: 380 Ohm
Lead Channel Impedance Value: 399 Ohm
Lead Channel Impedance Value: 399 Ohm
Lead Channel Impedance Value: 4047 Ohm
Lead Channel Impedance Value: 456 Ohm
Lead Channel Impedance Value: 456 Ohm
Lead Channel Impedance Value: 513 Ohm
Lead Channel Impedance Value: 570 Ohm
Lead Channel Impedance Value: 570 Ohm
Lead Channel Impedance Value: 608 Ohm
Lead Channel Pacing Threshold Amplitude: 0.5 V
Lead Channel Pacing Threshold Amplitude: 3.5 V
Lead Channel Pacing Threshold Pulse Width: 0.4 ms
Lead Channel Pacing Threshold Pulse Width: 1 ms
Lead Channel Sensing Intrinsic Amplitude: 11.25 mV
Lead Channel Sensing Intrinsic Amplitude: 11.25 mV
Lead Channel Setting Pacing Amplitude: 2.5 V
Lead Channel Setting Pacing Amplitude: 3.25 V
Lead Channel Setting Pacing Pulse Width: 0.4 ms
Lead Channel Setting Pacing Pulse Width: 1 ms
Lead Channel Setting Sensing Sensitivity: 0.3 mV
Zone Setting Status: 755011

## 2022-06-23 DIAGNOSIS — E1169 Type 2 diabetes mellitus with other specified complication: Secondary | ICD-10-CM | POA: Diagnosis not present

## 2022-06-23 DIAGNOSIS — J449 Chronic obstructive pulmonary disease, unspecified: Secondary | ICD-10-CM | POA: Diagnosis not present

## 2022-06-23 DIAGNOSIS — E7800 Pure hypercholesterolemia, unspecified: Secondary | ICD-10-CM | POA: Diagnosis not present

## 2022-06-23 DIAGNOSIS — Z1389 Encounter for screening for other disorder: Secondary | ICD-10-CM | POA: Diagnosis not present

## 2022-06-23 DIAGNOSIS — I739 Peripheral vascular disease, unspecified: Secondary | ICD-10-CM | POA: Diagnosis not present

## 2022-06-23 DIAGNOSIS — E669 Obesity, unspecified: Secondary | ICD-10-CM | POA: Diagnosis not present

## 2022-06-23 DIAGNOSIS — D649 Anemia, unspecified: Secondary | ICD-10-CM | POA: Diagnosis not present

## 2022-06-23 DIAGNOSIS — Z7901 Long term (current) use of anticoagulants: Secondary | ICD-10-CM | POA: Diagnosis not present

## 2022-06-23 DIAGNOSIS — Z6837 Body mass index (BMI) 37.0-37.9, adult: Secondary | ICD-10-CM | POA: Diagnosis not present

## 2022-06-23 DIAGNOSIS — Z23 Encounter for immunization: Secondary | ICD-10-CM | POA: Diagnosis not present

## 2022-06-23 DIAGNOSIS — I4891 Unspecified atrial fibrillation: Secondary | ICD-10-CM | POA: Diagnosis not present

## 2022-06-23 DIAGNOSIS — Z125 Encounter for screening for malignant neoplasm of prostate: Secondary | ICD-10-CM | POA: Diagnosis not present

## 2022-06-23 DIAGNOSIS — Z0000 Encounter for general adult medical examination without abnormal findings: Secondary | ICD-10-CM | POA: Diagnosis not present

## 2022-06-23 DIAGNOSIS — I10 Essential (primary) hypertension: Secondary | ICD-10-CM | POA: Diagnosis not present

## 2022-06-26 ENCOUNTER — Telehealth: Payer: Self-pay

## 2022-06-26 DIAGNOSIS — D519 Vitamin B12 deficiency anemia, unspecified: Secondary | ICD-10-CM | POA: Diagnosis not present

## 2022-06-26 NOTE — Telephone Encounter (Signed)
06/24/22 labs received from patients primary care provider, forward to Dr Shirlee More for review

## 2022-07-09 NOTE — Progress Notes (Signed)
Remote ICD transmission.   

## 2022-07-23 DIAGNOSIS — D519 Vitamin B12 deficiency anemia, unspecified: Secondary | ICD-10-CM | POA: Diagnosis not present

## 2022-07-23 DIAGNOSIS — Z7901 Long term (current) use of anticoagulants: Secondary | ICD-10-CM | POA: Diagnosis not present

## 2022-08-12 DIAGNOSIS — Z7901 Long term (current) use of anticoagulants: Secondary | ICD-10-CM | POA: Diagnosis not present

## 2022-09-10 DIAGNOSIS — Z7901 Long term (current) use of anticoagulants: Secondary | ICD-10-CM | POA: Diagnosis not present

## 2022-09-10 DIAGNOSIS — I482 Chronic atrial fibrillation, unspecified: Secondary | ICD-10-CM | POA: Diagnosis not present

## 2022-09-17 ENCOUNTER — Ambulatory Visit (INDEPENDENT_AMBULATORY_CARE_PROVIDER_SITE_OTHER): Payer: Medicare Other

## 2022-09-17 DIAGNOSIS — I428 Other cardiomyopathies: Secondary | ICD-10-CM | POA: Diagnosis not present

## 2022-09-17 LAB — CUP PACEART REMOTE DEVICE CHECK
Battery Remaining Longevity: 7 mo
Battery Voltage: 2.86 V
Brady Statistic AP VP Percent: 0 %
Brady Statistic AP VS Percent: 0 %
Brady Statistic AS VP Percent: 0 %
Brady Statistic AS VS Percent: 0 %
Brady Statistic RA Percent Paced: 0 %
Brady Statistic RV Percent Paced: 99.73 %
Date Time Interrogation Session: 20240221022703
HighPow Impedance: 81 Ohm
Implantable Lead Connection Status: 753985
Implantable Lead Connection Status: 753985
Implantable Lead Implant Date: 20200103
Implantable Lead Implant Date: 20200103
Implantable Lead Location: 753858
Implantable Lead Location: 753860
Implantable Lead Model: 4598
Implantable Lead Model: 6935
Implantable Pulse Generator Implant Date: 20200103
Lead Channel Impedance Value: 142.5 Ohm
Lead Channel Impedance Value: 151.406
Lead Channel Impedance Value: 155.455
Lead Channel Impedance Value: 155.455
Lead Channel Impedance Value: 166.114
Lead Channel Impedance Value: 285 Ohm
Lead Channel Impedance Value: 285 Ohm
Lead Channel Impedance Value: 323 Ohm
Lead Channel Impedance Value: 342 Ohm
Lead Channel Impedance Value: 399 Ohm
Lead Channel Impedance Value: 4047 Ohm
Lead Channel Impedance Value: 437 Ohm
Lead Channel Impedance Value: 437 Ohm
Lead Channel Impedance Value: 494 Ohm
Lead Channel Impedance Value: 494 Ohm
Lead Channel Impedance Value: 551 Ohm
Lead Channel Impedance Value: 570 Ohm
Lead Channel Impedance Value: 608 Ohm
Lead Channel Pacing Threshold Amplitude: 0.5 V
Lead Channel Pacing Threshold Amplitude: 2.75 V
Lead Channel Pacing Threshold Pulse Width: 0.4 ms
Lead Channel Pacing Threshold Pulse Width: 1 ms
Lead Channel Sensing Intrinsic Amplitude: 18.375 mV
Lead Channel Sensing Intrinsic Amplitude: 18.375 mV
Lead Channel Setting Pacing Amplitude: 2.5 V
Lead Channel Setting Pacing Amplitude: 3.25 V
Lead Channel Setting Pacing Pulse Width: 0.4 ms
Lead Channel Setting Pacing Pulse Width: 1 ms
Lead Channel Setting Sensing Sensitivity: 0.3 mV
Zone Setting Status: 755011

## 2022-09-23 DIAGNOSIS — J9611 Chronic respiratory failure with hypoxia: Secondary | ICD-10-CM | POA: Diagnosis not present

## 2022-09-23 DIAGNOSIS — Z7901 Long term (current) use of anticoagulants: Secondary | ICD-10-CM | POA: Diagnosis not present

## 2022-09-23 DIAGNOSIS — E7800 Pure hypercholesterolemia, unspecified: Secondary | ICD-10-CM | POA: Diagnosis not present

## 2022-09-23 DIAGNOSIS — I482 Chronic atrial fibrillation, unspecified: Secondary | ICD-10-CM | POA: Diagnosis not present

## 2022-09-23 DIAGNOSIS — E1169 Type 2 diabetes mellitus with other specified complication: Secondary | ICD-10-CM | POA: Diagnosis not present

## 2022-09-23 DIAGNOSIS — J449 Chronic obstructive pulmonary disease, unspecified: Secondary | ICD-10-CM | POA: Diagnosis not present

## 2022-09-23 DIAGNOSIS — I10 Essential (primary) hypertension: Secondary | ICD-10-CM | POA: Diagnosis not present

## 2022-09-23 DIAGNOSIS — D519 Vitamin B12 deficiency anemia, unspecified: Secondary | ICD-10-CM | POA: Diagnosis not present

## 2022-10-07 DIAGNOSIS — I482 Chronic atrial fibrillation, unspecified: Secondary | ICD-10-CM | POA: Diagnosis not present

## 2022-10-07 DIAGNOSIS — Z7901 Long term (current) use of anticoagulants: Secondary | ICD-10-CM | POA: Diagnosis not present

## 2022-10-09 ENCOUNTER — Ambulatory Visit: Payer: Medicare Other | Attending: Internal Medicine | Admitting: Internal Medicine

## 2022-10-09 ENCOUNTER — Encounter: Payer: Self-pay | Admitting: Internal Medicine

## 2022-10-09 VITALS — BP 104/62 | HR 62 | Ht 67.0 in | Wt 212.2 lb

## 2022-10-09 DIAGNOSIS — I4891 Unspecified atrial fibrillation: Secondary | ICD-10-CM | POA: Diagnosis not present

## 2022-10-09 DIAGNOSIS — I5020 Unspecified systolic (congestive) heart failure: Secondary | ICD-10-CM

## 2022-10-09 DIAGNOSIS — I10 Essential (primary) hypertension: Secondary | ICD-10-CM | POA: Diagnosis not present

## 2022-10-09 DIAGNOSIS — Z95810 Presence of automatic (implantable) cardiac defibrillator: Secondary | ICD-10-CM | POA: Diagnosis not present

## 2022-10-09 LAB — CUP PACEART INCLINIC DEVICE CHECK
Battery Remaining Longevity: 7 mo
Battery Voltage: 2.84 V
Brady Statistic AP VP Percent: 0 %
Brady Statistic AP VS Percent: 0 %
Brady Statistic AS VP Percent: 0 %
Brady Statistic AS VS Percent: 0 %
Brady Statistic RA Percent Paced: 0 %
Brady Statistic RV Percent Paced: 99.75 %
Date Time Interrogation Session: 20240314124818
HighPow Impedance: 82 Ohm
Implantable Lead Connection Status: 753985
Implantable Lead Connection Status: 753985
Implantable Lead Implant Date: 20200103
Implantable Lead Implant Date: 20200103
Implantable Lead Location: 753858
Implantable Lead Location: 753860
Implantable Lead Model: 4598
Implantable Lead Model: 6935
Implantable Pulse Generator Implant Date: 20200103
Lead Channel Impedance Value: 142.5 Ohm
Lead Channel Impedance Value: 155.455
Lead Channel Impedance Value: 166.25 Ohm
Lead Channel Impedance Value: 166.25 Ohm
Lead Channel Impedance Value: 184.154
Lead Channel Impedance Value: 285 Ohm
Lead Channel Impedance Value: 285 Ohm
Lead Channel Impedance Value: 342 Ohm
Lead Channel Impedance Value: 399 Ohm
Lead Channel Impedance Value: 399 Ohm
Lead Channel Impedance Value: 4047 Ohm
Lead Channel Impedance Value: 437 Ohm
Lead Channel Impedance Value: 494 Ohm
Lead Channel Impedance Value: 494 Ohm
Lead Channel Impedance Value: 513 Ohm
Lead Channel Impedance Value: 646 Ohm
Lead Channel Impedance Value: 646 Ohm
Lead Channel Impedance Value: 646 Ohm
Lead Channel Pacing Threshold Amplitude: 0.5 V
Lead Channel Pacing Threshold Amplitude: 2.75 V
Lead Channel Pacing Threshold Amplitude: 3.25 V
Lead Channel Pacing Threshold Pulse Width: 0.4 ms
Lead Channel Pacing Threshold Pulse Width: 1 ms
Lead Channel Pacing Threshold Pulse Width: 1 ms
Lead Channel Sensing Intrinsic Amplitude: 18.375 mV
Lead Channel Sensing Intrinsic Amplitude: 18.375 mV
Lead Channel Setting Pacing Amplitude: 2.5 V
Lead Channel Setting Pacing Amplitude: 3.25 V
Lead Channel Setting Pacing Pulse Width: 0.4 ms
Lead Channel Setting Pacing Pulse Width: 1 ms
Lead Channel Setting Sensing Sensitivity: 0.3 mV
Zone Setting Status: 755011

## 2022-10-09 NOTE — Patient Instructions (Addendum)
Medication Instructions:  Your physician recommends that you continue on your current medications as directed. Please refer to the Current Medication list given to you today.  *If you need a refill on your cardiac medications before your next appointment, please call your pharmacy*  Lab Work: None ordered.  If you have labs (blood work) drawn today and your tests are completely normal, you will receive your results only by: [place] (if you have MyChart) OR A paper copy in the mail If you have any lab test that is abnormal or we need to change your treatment, we will call you to review the results.  Testing/Procedures: None ordered.  Follow-Up: At [facility], you and your health needs are our priority.  As part of our continuing mission to provide you with exceptional heart care, we have created designated Provider Care Teams.  These Care Teams include your primary Cardiologist (physician) and Advanced Practice Providers (APPs -  Physician Assistants and Nurse Practitioners) who all work together to provide you with the care you need, when you need it.  Your next appointment:   Please schedule a 9-10 month follow up appointment with Dr. [name]. Pt is nearing ERI on his device, and will need to see Dr. Lovena Le in the fall of 2024.    The format for your next appointment:   In Person  Provider:   Cristopher Peru, MD{or one of the following Advanced Practice Providers on your designated Care Team:   [name], [name] "[name]" Chalmers Cater, [name]  Remote monitoring is used to monitor your ICD from home. This monitoring reduces the number of office visits required to check your device to one time per year. It allows Jaime Scott to keep an eye on the functioning of your device to ensure it is working properly. You are scheduled for a device check from home on 12/17/22. You may send your transmission at any time that day. If you have a wireless device, the transmission will be sent  automatically. After your physician reviews your transmission, you will receive a postcard with your next transmission date.

## 2022-10-09 NOTE — Progress Notes (Signed)
HPI He is a 72 yo man with persistent atrial fib and tachybrady s/p PPM insertion who developed worsening CHF symptoms associated with uncontrolled atrial fib and underwent AV node ablation. He has stopped smoking but since last being seen in our clinic, gained over 20 lbs.Today, he denies symptoms of palpitations, chest pain, shortness of breath,  lower extremity edema, dizziness, presyncope, or syncope.  He admits to being sedentary . Allergies  Allergen Reactions   Ivp Dye [Iodinated Contrast Media] Other (See Comments)    Constipation x 7 days     Current Outpatient Medications  Medication Sig Dispense Refill   enalapril (VASOTEC) 2.5 MG tablet Take 2.5 mg by mouth daily.     eplerenone (INSPRA) 25 MG tablet Take 1 tablet (25 mg total) by mouth daily. 90 tablet 3   furosemide (LASIX) 80 MG tablet Take 80 mg by mouth as directed. 2 tablets in the morning and 1 tablets in the evening     metFORMIN (GLUCOPHAGE-XR) 500 MG 24 hr tablet Take 1,000 mg by mouth 2 (two) times daily.      metoprolol succinate (TOPROL-XL) 25 MG 24 hr tablet TAKE 1 TABLET BY MOUTH ONCE DAILY WITH FOOD 90 tablet 3   OXYGEN Inhale 4 L into the lungs as needed.     potassium chloride SA (K-DUR,KLOR-CON) 20 MEQ tablet Take 20 mEq by mouth every evening.      simvastatin (ZOCOR) 20 MG tablet Take 20 mg by mouth every evening.      warfarin (COUMADIN) 1 MG tablet Take 1 mg by mouth daily.      warfarin (COUMADIN) 5 MG tablet Take 5 mg by mouth daily at 8 pm.     No current facility-administered medications for this visit.     Past Medical History:  Diagnosis Date   Alcohol abuse    Arthritis    Atrial fibrillation ([place]) 08/02/2018   CHF (congestive heart failure) ([place]) 03/12/2017   Chronic atrial fibrillation ([place]) 03/12/2017   Chronic respiratory failure ([facility])    COPD (chronic obstructive pulmonary disease) ([place]) 03/12/2017   Dyspnea    wears Oxygen 24/7 3 L   Essential hypertension 03/12/2017   High  risk medication use 05/05/2017   Hyperlipidemia 03/12/2017   Hypertensive heart disease with heart failure ([place]) 03/12/2017   ICD (implantable cardioverter-defibrillator) in place 07/30/2018   Ischemic cardiomyopathy 03/12/2017   Long term (current) use of anticoagulants 03/12/2017   Peripheral vascular disease ([place]) 03/12/2017   Preoperative cardiovascular examination 03/25/2015   Presence of combination internal cardiac defibrillator (ICD) and pacemaker 03/12/2017   unable to place a CRT    Type 2 diabetes mellitus without complications ([place]) 123XX123    ROS:   All systems reviewed and negative except as noted in the HPI.   Past Surgical History:  Procedure Laterality Date   AV NODE ABLATION N/A 08/02/2018   Procedure: AV NODE ABLATION;  Surgeon: Evans Lance, MD;  Location: [facility];  Service: Cardiovascular;  Laterality: N/A;   BIV ICD INSERTION CRT-D N/A 07/30/2018   Procedure: BIV ICD INSERTION CRT-D;  Surgeon: Evans Lance, MD;  Location: [facility];  Service: Cardiovascular;  Laterality: N/A;   BIV PACEMAKER GENERATOR CHANGEOUT N/A 05/27/2018   Procedure: BIV PACEMAKER GENERATOR UPGRADE - Aborted;  Surgeon: Constance Haw, MD;  Location: [facility];  Service: Cardiovascular;  Laterality: N/A;   CHOLECYSTECTOMY     COLONOSCOPY     ERCP  stone removal   EXCISION OF BREAST BIOPSY     Age 4, benign   PACEMAKER LEAD REMOVAL N/A 07/30/2018   Procedure: PACEMAKER LEAD REMOVAL;  Surgeon: Evans Lance, MD;  Location: [facility];  Service: Cardiovascular;  Laterality: N/A;     Family History  Problem Relation Age of Onset   Breast cancer Sister    Alcohol abuse Brother    Alcohol abuse Brother      Social History   Socioeconomic History   Marital status: Divorced    Spouse name: Not on file   Number of children: Not on file   Years of education: Not on file   Highest education level: Not on file  Occupational History   Not on file  Tobacco Use    Smoking status: Former    Packs/day: 1.00    Years: 61.00    Additional pack years: 0.00    Total pack years: 61.00    Types: Cigarettes    Quit date: 08/2018    Years since quitting: 4.1   Smokeless tobacco: Never  Vaping Use   Vaping Use: Never used  Substance and Sexual Activity   Alcohol use: No   Drug use: No   Sexual activity: Not on file  Other Topics Concern   Not on file  Social History Narrative   Not on file   Social Determinants of Health   Financial Resource Strain: Not on file  Food Insecurity: Not on file  Transportation Needs: Not on file  Physical Activity: Not on file  Stress: Not on file  Social Connections: Not on file  Intimate Partner Violence: Not on file     BP 104/62   Pulse 62   Ht '5\' 7"'$  (1.702 m)   Wt 212 lb 3.2 oz (96.3 kg)   SpO2 94%   BMI 33.24 kg/m   Physical Exam:  Well appearing NAD HEENT: Unremarkable Neck:  No JVD, no thyromegally Lymphatics:  No adenopathy Back:  No CVA tenderness Lungs:  Clear HEART:  Regular rate rhythm, no murmurs, no rubs, no clicks Abd:  soft, positive bowel sounds, no organomegally, no rebound, no guarding Ext:  2 plus pulses, no edema, no cyanosis, no clubbing Skin:  No rashes no nodules Neuro:  CN II through XII intact, motor grossly intact  EKG - afib with ventricular pacing  DEVICE  Normal device function.  See PaceArt for details.   Assess/Plan: 1. Atrial fib - his VR is well controlled. He will continue his current meds. 2. Coags - he will continue warfarin 3. HTN -his bp is well controlled. 4. Dyslipidemia - he will continue simvastatin.   Carleene Overlie Aryia Delira,MD

## 2022-10-13 NOTE — Progress Notes (Signed)
Remote ICD transmission.   

## 2022-10-20 ENCOUNTER — Ambulatory Visit: Payer: Medicare Other | Attending: Cardiology | Admitting: Cardiology

## 2022-10-20 ENCOUNTER — Encounter: Payer: Self-pay | Admitting: Cardiology

## 2022-10-20 VITALS — BP 98/64 | HR 68 | Ht 67.0 in | Wt 209.8 lb

## 2022-10-20 DIAGNOSIS — I428 Other cardiomyopathies: Secondary | ICD-10-CM

## 2022-10-20 DIAGNOSIS — Z95810 Presence of automatic (implantable) cardiac defibrillator: Secondary | ICD-10-CM | POA: Diagnosis not present

## 2022-10-20 DIAGNOSIS — Z7901 Long term (current) use of anticoagulants: Secondary | ICD-10-CM | POA: Diagnosis not present

## 2022-10-20 DIAGNOSIS — I5022 Chronic systolic (congestive) heart failure: Secondary | ICD-10-CM

## 2022-10-20 DIAGNOSIS — J449 Chronic obstructive pulmonary disease, unspecified: Secondary | ICD-10-CM | POA: Diagnosis not present

## 2022-10-20 DIAGNOSIS — I110 Hypertensive heart disease with heart failure: Secondary | ICD-10-CM | POA: Diagnosis not present

## 2022-10-20 DIAGNOSIS — I482 Chronic atrial fibrillation, unspecified: Secondary | ICD-10-CM | POA: Diagnosis not present

## 2022-10-20 DIAGNOSIS — I5020 Unspecified systolic (congestive) heart failure: Secondary | ICD-10-CM

## 2022-10-20 NOTE — Progress Notes (Signed)
Cardiology Office Note:    Date:  10/20/2022   ID:  Jaime Scott, DOB 1950-11-01, MRN [id number]  PCP:  Greig Right, MD  Cardiologist:  Shirlee More, MD    Referring MD: Greig Right, MD    ASSESSMENT:    1. Chronic atrial fibrillation ([place])   2. Long term (current) use of anticoagulants   3. Nonischemic cardiomyopathy ([place])   4. Systolic congestive heart failure, unspecified HF chronicity ([place])   5. ICD (implantable cardioverter-defibrillator) in place   6. Hypertensive heart disease with chronic systolic congestive heart failure ([place])   7. Chronic obstructive pulmonary disease, unspecified COPD type ([place])    PLAN:    In order of problems listed above:  Stable atrial fibrillation is ventricularly paced and will continue warfarin managed his PCP office Heart failure is nicely compensated continues a minimum dose of ACE inhibitor along with his loop diuretic and MRA.  Not on beta-blocker with lung disease and his underlying atrial fibrillation. ICD followed at our device clinic Stable COPD on ambulatory oxygen   Next appointment: 6 months   Medication Adjustments/Labs and Tests Ordered: Current medicines are reviewed at length with the patient today.  Concerns regarding medicines are outlined above.  No orders of the defined types were placed in this encounter.  No orders of the defined types were placed in this encounter.   Chief Complaint  Patient presents with   Follow-up   Congestive Heart Failure    History of Present Illness:    Jaime Scott is a 72 y.o. male with a hx of chronic atrial fibrillation with long-term anticoagulation chronic systolic heart failure with ICD hypertensive heart disease and COPD last seen 04/17/2022.  Echocardiogram in March 2023 showed mildly reduced EF 45 to 50% right ventricle mildly dilated both atria enlarged and normal pulmonary artery pressure.  Compliance with diet, lifestyle and medications: Yes  Clinically he  is doing better he has been using Spiriva which has been remarkably effective for his congestion He follows with the EP MD plan for device change in the near future He is not having edema change in his chronic shortness of breath using ambulatory oxygen no orthopnea chest pain palpitation or syncope Follows a labs in his PCP office most recent lipid profiles are 03/12/2022 LDL 64 cholesterol 156 A1c 6.5 hemoglobin 12.3 creatinine 0.66 potassium 4.2 His warfarin is managed by his PCP Past Medical History:  Diagnosis Date   Alcohol abuse    Arthritis    Atrial fibrillation (Jaime Scott) 08/02/2018   CHF (congestive heart failure) ([place]) 03/12/2017   Chronic atrial fibrillation (Jaime Scott) 03/12/2017   Chronic respiratory failure (HCC)    COPD (chronic obstructive pulmonary disease) (Jaime Scott) 03/12/2017   Dyspnea    wears Oxygen 24/7 3 L   Essential hypertension 03/12/2017   High risk medication use 05/05/2017   Hyperlipidemia 03/12/2017   Hypertensive heart disease with heart failure (Jaime Scott) 03/12/2017   ICD (implantable cardioverter-defibrillator) in place 07/30/2018   Ischemic cardiomyopathy 03/12/2017   Long term (current) use of anticoagulants 03/12/2017   Peripheral vascular disease (Jaime Scott) 03/12/2017   Preoperative cardiovascular examination 03/25/2015   Presence of combination internal cardiac defibrillator (ICD) and pacemaker 03/12/2017   unable to place a CRT    Type 2 diabetes mellitus without complications (Jaime Scott) [id number]    Past Surgical History:  Procedure Laterality Date   AV NODE ABLATION N/A 08/02/2018   Procedure: AV NODE ABLATION;  Surgeon: Evans Lance, MD;  Location: [facility] INVASIVE CV  LAB;  Service: Cardiovascular;  Laterality: N/A;   BIV ICD INSERTION CRT-D N/A 07/30/2018   Procedure: BIV ICD INSERTION CRT-D;  Surgeon: Evans Lance, MD;  Location: [place];  Service: Cardiovascular;  Laterality: N/A;   BIV PACEMAKER GENERATOR CHANGEOUT N/A 05/27/2018   Procedure: BIV PACEMAKER GENERATOR UPGRADE -  Aborted;  Surgeon: Constance Haw, MD;  Location: [place] CV LAB;  Service: Cardiovascular;  Laterality: N/A;   CHOLECYSTECTOMY     COLONOSCOPY     ERCP     stone removal   EXCISION OF BREAST BIOPSY     Age 81, benign   PACEMAKER LEAD REMOVAL N/A 07/30/2018   Procedure: PACEMAKER LEAD REMOVAL;  Surgeon: Evans Lance, MD;  Location: [facility] OR;  Service: Cardiovascular;  Laterality: N/A;    Current Medications: Current Meds  Medication Sig   Cyanocobalamin (B-12) 1000 MCG TABS Take 1,000 mcg by mouth daily.   diphenhydrAMINE (BENADRYL) 25 mg capsule Take 25 mg by mouth 2 (two) times daily.   enalapril (VASOTEC) 2.5 MG tablet Take 2.5 mg by mouth daily.   eplerenone (INSPRA) 25 MG tablet Take 1 tablet (25 mg total) by mouth daily.   furosemide (LASIX) 80 MG tablet Take 80 mg by mouth as directed. 2 tablets in the morning and 1 tablets in the evening   metFORMIN (GLUCOPHAGE-XR) 500 MG 24 hr tablet Take 1,000 mg by mouth 2 (two) times daily.    metoprolol succinate (TOPROL-XL) 25 MG 24 hr tablet TAKE 1 TABLET BY MOUTH ONCE DAILY WITH FOOD   OXYGEN Inhale 4 L into the lungs as needed.   potassium chloride SA (K-DUR,KLOR-CON) 20 MEQ tablet Take 20 mEq by mouth every evening.    rosuvastatin (CRESTOR) 20 MG tablet Take 20 mg by mouth daily.   tiotropium (SPIRIVA) 18 MCG inhalation capsule Place 18 mcg into inhaler and inhale daily.   warfarin (COUMADIN) 1 MG tablet Take 1 mg by mouth every other day.   warfarin (COUMADIN) 5 MG tablet Take 5 mg by mouth daily at 8 pm.     Allergies:   Ivp dye [iodinated contrast media]   Social History   Socioeconomic History   Marital status: Divorced    Spouse name: Not on file   Number of children: Not on file   Years of education: Not on file   Highest education level: Not on file  Occupational History   Not on file  Tobacco Use   Smoking status: Former    Packs/day: 1.00    Years: 61.00    Additional pack years: 0.00    Total pack  years: 61.00    Types: Cigarettes    Quit date: 08/2018    Years since quitting: 4.1   Smokeless tobacco: Never  Vaping Use   Vaping Use: Never used  Substance and Sexual Activity   Alcohol use: No   Drug use: No   Sexual activity: Not on file  Other Topics Concern   Not on file  Social History Narrative   Not on file   Social Determinants of Health   Financial Resource Strain: Not on file  Food Insecurity: Not on file  Transportation Needs: Not on file  Physical Activity: Not on file  Stress: Not on file  Social Connections: Not on file     Family History: The patient's family history includes Alcohol abuse in his brother and brother; Breast cancer in his sister. ROS:   Please see the history of present illness.  All other systems reviewed and are negative.  EKGs/Labs/Other Studies Reviewed:    The following studies were reviewed today:  Cardiac Studies & Procedures     STRESS TESTS  MYOCARDIAL PERFUSION IMAGING 04/09/2017   ECHOCARDIOGRAM  ECHOCARDIOGRAM COMPLETE 10/07/2021  Narrative ECHOCARDIOGRAM REPORT    Patient Name:   KACESON BENALLY Date of Exam: 10/07/2021 Medical Rec #:  [id number]         Height:       67.0 in Accession #:    [id number]        Weight:       229.2 lb Date of Birth:  [birth year]         BSA:          2.143 m Patient Age:    17 years          BP:           92/58 mmHg Patient Gender: M                 HR:           83 bpm. Exam Location:    Procedure: 2D Echo, Cardiac Doppler, Color Doppler and Strain Analysis  Indications:    CHF-Acute Systolic [id number]  History:        Patient has no prior history of Echocardiogram examinations. Dilated cardiomyopathy, Pacemaker, COPD and PAD, Arrythmias:Atrial Fibrillation and LBBB, Signs/Symptoms:Dyspnea; Risk Factors:Dyslipidemia and Hypertension.  Sonographer:    Luane School RDCS Referring Phys: [id number] [place]   1. GLS -9.9. Left ventricular  ejection fraction, by estimation, is 45 to 50%. The left ventricle has mildly decreased function. The left ventricle has no regional wall motion abnormalities. The left ventricular internal cavity size was mildly dilated. Left ventricular diastolic parameters are indeterminate. 2. Right ventricular systolic function is normal. The right ventricular size is normal. There is normal pulmonary artery systolic pressure. 3. Left atrial size was moderately dilated. 4. Right atrial size was mildly dilated. 5. The mitral valve is normal in structure. Mild mitral valve regurgitation. No evidence of mitral stenosis. 6. The aortic valve is normal in structure. Aortic valve regurgitation is mild. No aortic stenosis is present. 7. There is moderate dilatation of the ascending aorta, measuring 41 mm. 8. The inferior vena cava is normal in size with greater than 50% respiratory variability, suggesting right atrial pressure of 3 mmHg.  FINDINGS Left Ventricle: GLS -9.9. Left ventricular ejection fraction, by estimation, is 45 to 50%. The left ventricle has mildly decreased function. The left ventricle has no regional wall motion abnormalities. The left ventricular internal cavity size was mildly dilated. There is no left ventricular hypertrophy. Abnormal (paradoxical) septal motion, consistent with RV pacemaker. Left ventricular diastolic parameters are indeterminate.  Right Ventricle: The right ventricular size is normal. No increase in right ventricular wall thickness. Right ventricular systolic function is normal. There is normal pulmonary artery systolic pressure. The tricuspid regurgitant velocity is 2.43 m/s, and with an assumed right atrial pressure of 8 mmHg, the estimated right ventricular systolic pressure is [id number] mmHg.  Left Atrium: Left atrial size was moderately dilated.  Right Atrium: Right atrial size was mildly dilated.  Pericardium: There is no evidence of pericardial effusion.  Mitral Valve:  The mitral valve is normal in structure. Mild mitral valve regurgitation. No evidence of mitral valve stenosis.  Tricuspid Valve: The tricuspid valve is normal in structure. Tricuspid valve regurgitation is mild . No evidence of tricuspid stenosis.  Aortic Valve:  The aortic valve is normal in structure. Aortic valve regurgitation is mild. Aortic regurgitation PHT measures 660 msec. No aortic stenosis is present.  Pulmonic Valve: The pulmonic valve was normal in structure. Pulmonic valve regurgitation is not visualized. No evidence of pulmonic stenosis.  Aorta: The aortic root is normal in size and structure. There is moderate dilatation of the ascending aorta, measuring 41 mm.  Venous: The inferior vena cava is normal in size with greater than 50% respiratory variability, suggesting right atrial pressure of 3 mmHg.  IAS/Shunts: No atrial level shunt detected by color flow Doppler.  Additional Comments: A device lead is visualized.   LEFT VENTRICLE PLAX 2D LVIDd:         5.90 cm      Diastology LVIDs:         4.50 cm      LV e' medial:    7.94 cm/s LV PW:         1.10 cm      LV E/e' medial:  12.0 LV IVS:        1.00 cm      LV e' lateral:   12.00 cm/s LVOT diam:     2.00 cm      LV E/e' lateral: 8.0 LV SV:         56 LV SV Index:   26 LVOT Area:     3.14 cm  LV Volumes (MOD) LV vol d, MOD A2C: 108.0 ml LV vol d, MOD A4C: 113.0 ml LV vol s, MOD A2C: 54.1 ml LV vol s, MOD A4C: 63.7 ml LV SV MOD A2C:     53.9 ml LV SV MOD A4C:     113.0 ml LV SV MOD BP:      52.5 ml  RIGHT VENTRICLE            IVC RV S prime:     9.57 cm/s  IVC diam: 2.60 cm TAPSE (M-mode): 1.9 cm  LEFT ATRIUM              Index        RIGHT ATRIUM           Index LA diam:        4.70 cm  2.19 cm/m   RA Area:     21.80 cm LA Vol (A2C):   110.0 ml 51.33 ml/m  RA Volume:   72.70 ml  33.93 ml/m LA Vol (A4C):   101.0 ml 47.13 ml/m LA Biplane Vol: 110.0 ml 51.33 ml/m AORTIC VALVE LVOT Vmax:   103.00  cm/s LVOT Vmean:  66.600 cm/s LVOT VTI:    0.177 m AI PHT:      660 msec  AORTA Ao Root diam: 3.60 cm Ao Asc diam:  4.10 cm  MITRAL VALVE               TRICUSPID VALVE MV Area (PHT): 3.53 cm    TR Peak grad:   23.6 mmHg MV Decel Time: 215 msec    TR Vmax:        243.00 cm/s MV E velocity: 95.50 cm/s MV A velocity: 25.10 cm/s  SHUNTS MV E/A ratio:  3.80        Systemic VTI:  0.18 m Systemic Diam: 2.00 cm  Jenne Campus MD Electronically signed by Jenne Campus MD Signature Date/Time: 10/07/2021/12:32:46 PM    Final             EKG:  EKG ordered by EP 10/19/2022  showed underlying A-fib 100% RV ventricular paced rhythm    Physical Exam:    VS:  BP 98/64 (BP Location: Left Arm, Patient Position: Sitting)   Pulse 68   Ht 5\' 7"  (1.702 m)   Wt 209 lb 12.8 oz (95.2 kg)   SpO2 93% Comment: 4 Liters [place]  BMI 32.86 kg/m     Wt Readings from Last 3 Encounters:  10/20/22 209 lb 12.8 oz (95.2 kg)  10/09/22 212 lb 3.2 oz (96.3 kg)  04/17/22 212 lb 6.4 oz (96.3 kg)     GEN:  Well nourished, well developed in no acute distress HEENT: Normal NECK: No JVD; No carotid bruits LYMPHATICS: No lymphadenopathy CARDIAC: RRR, no murmurs, rubs, gallops RESPIRATORY:  Clear to auscultation without rales, wheezing or rhonchi  ABDOMEN: Soft, non-tender, non-distended MUSCULOSKELETAL:  No edema; No deformity  SKIN: Warm and dry NEUROLOGIC:  Alert and oriented x 3 PSYCHIATRIC:  Normal affect    Signed, Shirlee More, MD  10/20/2022 1:30 PM    [place]

## 2022-10-20 NOTE — Patient Instructions (Signed)
Medication Instructions:  Your physician recommends that you continue on your current medications as directed. Please refer to the Current Medication list given to you today.  *If you need a refill on your cardiac medications before your next appointment, please call your pharmacy*   Lab Work: None If you have labs (blood work) drawn today and your tests are completely normal, you will receive your results only by: MyChart Message (if you have MyChart) OR A paper copy in the mail If you have any lab test that is abnormal or we need to change your treatment, we will call you to review the results.   Testing/Procedures: None   Follow-Up: At [facility], you and your health needs are our priority.  As part of our continuing mission to provide you with exceptional heart care, we have created designated Provider Care Teams.  These Care Teams include your primary Cardiologist (physician) and Advanced Practice Providers (APPs -  Physician Assistants and Nurse Practitioners) who all work together to provide you with the care you need, when you need it.  We recommend signing up for the patient portal called "MyChart".  Sign up information is provided on this After Visit Summary.  MyChart is used to connect with patients for Virtual Visits (Telemedicine).  Patients are able to view lab/test results, encounter notes, upcoming appointments, etc.  Non-urgent messages can be sent to your provider as well.   To learn more about what you can do with MyChart, go to [email or web address].    Your next appointment:   6 month(s)  Provider:   Brian Munley, MD    Other Instructions None  

## 2022-11-05 DIAGNOSIS — Z7901 Long term (current) use of anticoagulants: Secondary | ICD-10-CM | POA: Diagnosis not present

## 2022-11-05 DIAGNOSIS — I482 Chronic atrial fibrillation, unspecified: Secondary | ICD-10-CM | POA: Diagnosis not present

## 2022-12-04 DIAGNOSIS — I482 Chronic atrial fibrillation, unspecified: Secondary | ICD-10-CM | POA: Diagnosis not present

## 2022-12-04 DIAGNOSIS — Z7901 Long term (current) use of anticoagulants: Secondary | ICD-10-CM | POA: Diagnosis not present

## 2022-12-08 ENCOUNTER — Other Ambulatory Visit: Payer: Self-pay | Admitting: Cardiology

## 2022-12-08 NOTE — Telephone Encounter (Signed)
Refill to pharmacy 

## 2022-12-17 ENCOUNTER — Ambulatory Visit (INDEPENDENT_AMBULATORY_CARE_PROVIDER_SITE_OTHER): Payer: Medicare Other

## 2022-12-17 DIAGNOSIS — I5020 Unspecified systolic (congestive) heart failure: Secondary | ICD-10-CM | POA: Diagnosis not present

## 2022-12-17 DIAGNOSIS — I428 Other cardiomyopathies: Secondary | ICD-10-CM

## 2022-12-17 LAB — CUP PACEART REMOTE DEVICE CHECK
Battery Remaining Longevity: 5 mo
Battery Voltage: 2.82 V
Brady Statistic AP VP Percent: 0 %
Brady Statistic AP VS Percent: 0 %
Brady Statistic AS VP Percent: 0 %
Brady Statistic AS VS Percent: 0 %
Brady Statistic RA Percent Paced: 0 %
Brady Statistic RV Percent Paced: 99.79 %
Date Time Interrogation Session: 20240522012403
HighPow Impedance: 77 Ohm
Implantable Lead Connection Status: 753985
Implantable Lead Connection Status: 753985
Implantable Lead Implant Date: 20200103
Implantable Lead Implant Date: 20200103
Implantable Lead Location: 753858
Implantable Lead Location: 753860
Implantable Lead Model: 4598
Implantable Lead Model: 6935
Implantable Pulse Generator Implant Date: 20200103
Lead Channel Impedance Value: 137.586
Lead Channel Impedance Value: 142.5 Ohm
Lead Channel Impedance Value: 149.625
Lead Channel Impedance Value: 155.455
Lead Channel Impedance Value: 155.455
Lead Channel Impedance Value: 266 Ohm
Lead Channel Impedance Value: 285 Ohm
Lead Channel Impedance Value: 285 Ohm
Lead Channel Impedance Value: 342 Ohm
Lead Channel Impedance Value: 399 Ohm
Lead Channel Impedance Value: 399 Ohm
Lead Channel Impedance Value: 4047 Ohm
Lead Channel Impedance Value: 456 Ohm
Lead Channel Impedance Value: 494 Ohm
Lead Channel Impedance Value: 494 Ohm
Lead Channel Impedance Value: 551 Ohm
Lead Channel Impedance Value: 570 Ohm
Lead Channel Impedance Value: 608 Ohm
Lead Channel Pacing Threshold Amplitude: 0.5 V
Lead Channel Pacing Threshold Amplitude: 4 V
Lead Channel Pacing Threshold Pulse Width: 0.4 ms
Lead Channel Pacing Threshold Pulse Width: 1 ms
Lead Channel Sensing Intrinsic Amplitude: 18.375 mV
Lead Channel Sensing Intrinsic Amplitude: 18.375 mV
Lead Channel Setting Pacing Amplitude: 2.5 V
Lead Channel Setting Pacing Amplitude: 3.25 V
Lead Channel Setting Pacing Pulse Width: 0.4 ms
Lead Channel Setting Pacing Pulse Width: 1 ms
Lead Channel Setting Sensing Sensitivity: 0.3 mV
Zone Setting Status: 755011

## 2022-12-25 DIAGNOSIS — I10 Essential (primary) hypertension: Secondary | ICD-10-CM | POA: Diagnosis not present

## 2022-12-25 DIAGNOSIS — D519 Vitamin B12 deficiency anemia, unspecified: Secondary | ICD-10-CM | POA: Diagnosis not present

## 2022-12-25 DIAGNOSIS — E7800 Pure hypercholesterolemia, unspecified: Secondary | ICD-10-CM | POA: Diagnosis not present

## 2022-12-25 DIAGNOSIS — I482 Chronic atrial fibrillation, unspecified: Secondary | ICD-10-CM | POA: Diagnosis not present

## 2022-12-25 DIAGNOSIS — E669 Obesity, unspecified: Secondary | ICD-10-CM | POA: Diagnosis not present

## 2022-12-25 DIAGNOSIS — J449 Chronic obstructive pulmonary disease, unspecified: Secondary | ICD-10-CM | POA: Diagnosis not present

## 2022-12-25 DIAGNOSIS — E1169 Type 2 diabetes mellitus with other specified complication: Secondary | ICD-10-CM | POA: Diagnosis not present

## 2022-12-25 DIAGNOSIS — D649 Anemia, unspecified: Secondary | ICD-10-CM | POA: Diagnosis not present

## 2022-12-25 DIAGNOSIS — J9611 Chronic respiratory failure with hypoxia: Secondary | ICD-10-CM | POA: Diagnosis not present

## 2022-12-25 DIAGNOSIS — Z6834 Body mass index (BMI) 34.0-34.9, adult: Secondary | ICD-10-CM | POA: Diagnosis not present

## 2022-12-26 LAB — LAB REPORT - SCANNED
A1c: 6
EGFR: 97

## 2022-12-29 ENCOUNTER — Telehealth: Payer: Self-pay

## 2022-12-29 NOTE — Telephone Encounter (Signed)
Labs collected 12/25/2022 at patients pcp [facility] received forward to Dr Dulce Sellar for review

## 2023-01-07 NOTE — Progress Notes (Signed)
Remote ICD transmission.   

## 2023-01-19 ENCOUNTER — Ambulatory Visit (INDEPENDENT_AMBULATORY_CARE_PROVIDER_SITE_OTHER): Payer: Medicare Other

## 2023-01-19 DIAGNOSIS — I5020 Unspecified systolic (congestive) heart failure: Secondary | ICD-10-CM

## 2023-01-19 DIAGNOSIS — I428 Other cardiomyopathies: Secondary | ICD-10-CM

## 2023-01-20 LAB — CUP PACEART REMOTE DEVICE CHECK
Battery Remaining Longevity: 4 mo
Battery Voltage: 2.81 V
Brady Statistic AP VP Percent: 0 %
Brady Statistic AP VS Percent: 0 %
Brady Statistic AS VP Percent: 0 %
Brady Statistic AS VS Percent: 0 %
Brady Statistic RA Percent Paced: 0 %
Brady Statistic RV Percent Paced: 99.74 %
Date Time Interrogation Session: 20240624043722
HighPow Impedance: 81 Ohm
Implantable Lead Connection Status: 753985
Implantable Lead Connection Status: 753985
Implantable Lead Implant Date: 20200103
Implantable Lead Implant Date: 20200103
Implantable Lead Location: 753858
Implantable Lead Location: 753860
Implantable Lead Model: 4598
Implantable Lead Model: 6935
Implantable Pulse Generator Implant Date: 20200103
Lead Channel Impedance Value: 151.406
Lead Channel Impedance Value: 151.406
Lead Channel Impedance Value: 151.406
Lead Channel Impedance Value: 151.406
Lead Channel Impedance Value: 161.5 Ohm
Lead Channel Impedance Value: 285 Ohm
Lead Channel Impedance Value: 285 Ohm
Lead Channel Impedance Value: 323 Ohm
Lead Channel Impedance Value: 323 Ohm
Lead Channel Impedance Value: 380 Ohm
Lead Channel Impedance Value: 4047 Ohm
Lead Channel Impedance Value: 437 Ohm
Lead Channel Impedance Value: 494 Ohm
Lead Channel Impedance Value: 513 Ohm
Lead Channel Impedance Value: 513 Ohm
Lead Channel Impedance Value: 513 Ohm
Lead Channel Impedance Value: 513 Ohm
Lead Channel Impedance Value: 608 Ohm
Lead Channel Pacing Threshold Amplitude: 0.5 V
Lead Channel Pacing Threshold Amplitude: 3.25 V
Lead Channel Pacing Threshold Pulse Width: 0.4 ms
Lead Channel Pacing Threshold Pulse Width: 1 ms
Lead Channel Sensing Intrinsic Amplitude: 18.375 mV
Lead Channel Sensing Intrinsic Amplitude: 18.375 mV
Lead Channel Setting Pacing Amplitude: 2.5 V
Lead Channel Setting Pacing Amplitude: 3.25 V
Lead Channel Setting Pacing Pulse Width: 0.4 ms
Lead Channel Setting Pacing Pulse Width: 1 ms
Lead Channel Setting Sensing Sensitivity: 0.3 mV
Zone Setting Status: 755011

## 2023-01-22 DIAGNOSIS — I482 Chronic atrial fibrillation, unspecified: Secondary | ICD-10-CM | POA: Diagnosis not present

## 2023-01-22 DIAGNOSIS — Z7901 Long term (current) use of anticoagulants: Secondary | ICD-10-CM | POA: Diagnosis not present

## 2023-02-05 NOTE — Addendum Note (Signed)
Addended by: Geralyn Flash D on: 02/05/2023 11:59 AM   Modules accepted: Level of Service

## 2023-02-05 NOTE — Progress Notes (Signed)
Remote ICD transmission.   

## 2023-02-18 ENCOUNTER — Ambulatory Visit: Payer: Medicare Other

## 2023-02-18 DIAGNOSIS — I428 Other cardiomyopathies: Secondary | ICD-10-CM

## 2023-02-19 DIAGNOSIS — I482 Chronic atrial fibrillation, unspecified: Secondary | ICD-10-CM | POA: Diagnosis not present

## 2023-02-19 DIAGNOSIS — Z7901 Long term (current) use of anticoagulants: Secondary | ICD-10-CM | POA: Diagnosis not present

## 2023-02-19 LAB — CUP PACEART REMOTE DEVICE CHECK
Battery Voltage: 2.8 V
Brady Statistic AP VP Percent: 0 %
Brady Statistic AP VS Percent: 0 %
Brady Statistic AS VP Percent: 0 %
Brady Statistic AS VS Percent: 0 %
Brady Statistic RV Percent Paced: 99.77 %
Date Time Interrogation Session: 20240724022824
HighPow Impedance: 84 Ohm
Implantable Lead Connection Status: 753985
Implantable Lead Connection Status: 753985
Implantable Lead Implant Date: 20200103
Implantable Lead Implant Date: 20200103
Implantable Lead Location: 753860
Implantable Lead Model: 4598
Implantable Lead Model: 6935
Lead Channel Impedance Value: 151.406
Lead Channel Impedance Value: 151.406
Lead Channel Impedance Value: 166.25 Ohm
Lead Channel Impedance Value: 166.25 Ohm
Lead Channel Impedance Value: 178.5 Ohm
Lead Channel Impedance Value: 285 Ohm
Lead Channel Impedance Value: 285 Ohm
Lead Channel Impedance Value: 399 Ohm
Lead Channel Impedance Value: 399 Ohm
Lead Channel Impedance Value: 437 Ohm
Lead Channel Impedance Value: 494 Ohm
Lead Channel Impedance Value: 494 Ohm
Lead Channel Impedance Value: 513 Ohm
Lead Channel Impedance Value: 570 Ohm
Lead Channel Impedance Value: 646 Ohm
Lead Channel Impedance Value: 665 Ohm
Lead Channel Pacing Threshold Amplitude: 0.5 V
Lead Channel Pacing Threshold Amplitude: 3.5 V
Lead Channel Pacing Threshold Pulse Width: 0.4 ms
Lead Channel Sensing Intrinsic Amplitude: 18.375 mV
Lead Channel Setting Pacing Amplitude: 2.5 V
Lead Channel Setting Pacing Amplitude: 3.25 V
Lead Channel Setting Pacing Pulse Width: 0.4 ms
Lead Channel Setting Pacing Pulse Width: 1 ms
Lead Channel Setting Sensing Sensitivity: 0.3 mV
Zone Setting Status: 755011

## 2023-03-03 NOTE — Progress Notes (Signed)
Remote ICD transmission.   

## 2023-03-03 NOTE — Addendum Note (Signed)
Addended by: Elease Etienne A on: 03/03/2023 10:34 AM   Modules accepted: Level of Service

## 2023-03-05 DIAGNOSIS — I482 Chronic atrial fibrillation, unspecified: Secondary | ICD-10-CM | POA: Diagnosis not present

## 2023-03-05 DIAGNOSIS — Z7901 Long term (current) use of anticoagulants: Secondary | ICD-10-CM | POA: Diagnosis not present

## 2023-03-18 ENCOUNTER — Ambulatory Visit (INDEPENDENT_AMBULATORY_CARE_PROVIDER_SITE_OTHER): Payer: Medicare Other

## 2023-03-18 DIAGNOSIS — I428 Other cardiomyopathies: Secondary | ICD-10-CM

## 2023-03-18 LAB — CUP PACEART REMOTE DEVICE CHECK
Battery Remaining Longevity: 3 mo
Battery Voltage: 2.79 V
Brady Statistic AP VP Percent: 0 %
Brady Statistic AP VS Percent: 0 %
Brady Statistic AS VP Percent: 0 %
Brady Statistic AS VS Percent: 0 %
Brady Statistic RA Percent Paced: 0 %
Brady Statistic RV Percent Paced: 99.77 %
Date Time Interrogation Session: 20240821033523
HighPow Impedance: 78 Ohm
Implantable Lead Connection Status: 753985
Implantable Lead Connection Status: 753985
Implantable Lead Implant Date: 20200103
Implantable Lead Implant Date: 20200103
Implantable Lead Location: 753858
Implantable Lead Location: 753860
Implantable Lead Model: 4598
Implantable Lead Model: 6935
Implantable Pulse Generator Implant Date: 20200103
Lead Channel Impedance Value: 145.871
Lead Channel Impedance Value: 149.625
Lead Channel Impedance Value: 151.406
Lead Channel Impedance Value: 155.455
Lead Channel Impedance Value: 166.114
Lead Channel Impedance Value: 266 Ohm
Lead Channel Impedance Value: 285 Ohm
Lead Channel Impedance Value: 323 Ohm
Lead Channel Impedance Value: 342 Ohm
Lead Channel Impedance Value: 380 Ohm
Lead Channel Impedance Value: 4047 Ohm
Lead Channel Impedance Value: 437 Ohm
Lead Channel Impedance Value: 494 Ohm
Lead Channel Impedance Value: 494 Ohm
Lead Channel Impedance Value: 513 Ohm
Lead Channel Impedance Value: 551 Ohm
Lead Channel Impedance Value: 570 Ohm
Lead Channel Impedance Value: 570 Ohm
Lead Channel Pacing Threshold Amplitude: 0.375 V
Lead Channel Pacing Threshold Amplitude: 3.25 V
Lead Channel Pacing Threshold Pulse Width: 0.4 ms
Lead Channel Pacing Threshold Pulse Width: 1 ms
Lead Channel Sensing Intrinsic Amplitude: 16.5 mV
Lead Channel Sensing Intrinsic Amplitude: 16.5 mV
Lead Channel Setting Pacing Amplitude: 2.5 V
Lead Channel Setting Pacing Amplitude: 3.25 V
Lead Channel Setting Pacing Pulse Width: 0.4 ms
Lead Channel Setting Pacing Pulse Width: 1 ms
Lead Channel Setting Sensing Sensitivity: 0.3 mV
Zone Setting Status: 755011

## 2023-03-27 NOTE — Progress Notes (Signed)
Remote ICD transmission.   

## 2023-04-02 DIAGNOSIS — J449 Chronic obstructive pulmonary disease, unspecified: Secondary | ICD-10-CM | POA: Diagnosis not present

## 2023-04-02 DIAGNOSIS — D649 Anemia, unspecified: Secondary | ICD-10-CM | POA: Diagnosis not present

## 2023-04-02 DIAGNOSIS — E1169 Type 2 diabetes mellitus with other specified complication: Secondary | ICD-10-CM | POA: Diagnosis not present

## 2023-04-02 DIAGNOSIS — I482 Chronic atrial fibrillation, unspecified: Secondary | ICD-10-CM | POA: Diagnosis not present

## 2023-04-02 DIAGNOSIS — I10 Essential (primary) hypertension: Secondary | ICD-10-CM | POA: Diagnosis not present

## 2023-04-02 DIAGNOSIS — Z6832 Body mass index (BMI) 32.0-32.9, adult: Secondary | ICD-10-CM | POA: Diagnosis not present

## 2023-04-02 DIAGNOSIS — J9611 Chronic respiratory failure with hypoxia: Secondary | ICD-10-CM | POA: Diagnosis not present

## 2023-04-02 DIAGNOSIS — E669 Obesity, unspecified: Secondary | ICD-10-CM | POA: Diagnosis not present

## 2023-04-02 DIAGNOSIS — Z7901 Long term (current) use of anticoagulants: Secondary | ICD-10-CM | POA: Diagnosis not present

## 2023-04-02 DIAGNOSIS — E7800 Pure hypercholesterolemia, unspecified: Secondary | ICD-10-CM | POA: Diagnosis not present

## 2023-04-14 ENCOUNTER — Encounter: Payer: Self-pay | Admitting: Internal Medicine

## 2023-04-14 ENCOUNTER — Ambulatory Visit: Payer: Medicare Other | Attending: Internal Medicine | Admitting: Internal Medicine

## 2023-04-14 VITALS — BP 110/62 | HR 82 | Ht 67.0 in | Wt 201.6 lb

## 2023-04-14 DIAGNOSIS — I255 Ischemic cardiomyopathy: Secondary | ICD-10-CM | POA: Diagnosis present

## 2023-04-14 DIAGNOSIS — I4891 Unspecified atrial fibrillation: Secondary | ICD-10-CM | POA: Diagnosis not present

## 2023-04-14 LAB — CUP PACEART INCLINIC DEVICE CHECK
Date Time Interrogation Session: 20240917103157
Implantable Lead Connection Status: 753985
Implantable Lead Connection Status: 753985
Implantable Lead Implant Date: 20200103
Implantable Lead Implant Date: 20200103
Implantable Lead Location: 753858
Implantable Lead Location: 753860
Implantable Lead Model: 4598
Implantable Lead Model: 6935
Implantable Pulse Generator Implant Date: 20200103

## 2023-04-14 NOTE — Patient Instructions (Signed)
Medication Instructions:  Your physician recommends that you continue on your current medications as directed. Please refer to the Current Medication list given to you today.  *If you need a refill on your cardiac medications before your next appointment, please call your pharmacy*  Lab Work: None ordered.  If you have labs (blood work) drawn today and your tests are completely normal, you will receive your results only by: MyChart Message (if you have MyChart) OR A paper copy in the mail If you have any lab test that is abnormal or we need to change your treatment, we will call you to review the results.  Testing/Procedures: None ordered.  Follow-Up: At [facility], you and your health needs are our priority.  As part of our continuing mission to provide you with exceptional heart care, we have created designated Provider Care Teams.  These Care Teams include your primary Cardiologist (physician) and Advanced Practice Providers (APPs -  Physician Assistants and Nurse Practitioners) who all work together to provide you with the care you need, when you need it.  Your next appointment:   1 year(s) and may schedule Gen change at [facility]  The format for your next appointment:   In Person  Provider:   [name], MD{or one of the following Advanced Practice Providers on your designated Care Team:   Francis Dowse, [place] Casimiro Needle "Mardelle Matte" [place], [place] Earnest Rosier, NP   Important Information About Sugar

## 2023-04-14 NOTE — Progress Notes (Signed)
HPI He is a 72 yo man with persistent atrial fib and tachybrady s/p PPM insertion who developed worsening CHF symptoms associated with uncontrolled atrial fib and underwent AV node ablation. He has stopped smoking but since last being seen in our clinic, gained over 20 lbs.Today, he denies symptoms of palpitations, chest pain, shortness of breath,  lower extremity edema, dizziness, presyncope, or syncope.  He admits to being sedentary .    Allergies  Allergen Reactions   Ivp Dye [Iodinated Contrast Media] Other (See Comments)    Constipation x 7 days     Current Outpatient Medications  Medication Sig Dispense Refill   Cyanocobalamin (B-12) 1000 MCG TABS Take 1,000 mcg by mouth daily.     diphenhydrAMINE (BENADRYL) 25 mg capsule Take 25 mg by mouth 2 (two) times daily.     enalapril (VASOTEC) 2.5 MG tablet Take 2.5 mg by mouth daily.     eplerenone (INSPRA) 25 MG tablet Take 1 tablet (25 mg total) by mouth daily. 90 tablet 3   ferrous sulfate 324 MG TBEC Take 324 mg by mouth daily.     furosemide (LASIX) 80 MG tablet Take 80 mg by mouth as directed. 2 tablets in the morning and 1 tablets in the evening     metFORMIN (GLUCOPHAGE-XR) 500 MG 24 hr tablet Take 1,000 mg by mouth 2 (two) times daily.      metoprolol succinate (TOPROL-XL) 25 MG 24 hr tablet TAKE 1 TABLET BY MOUTH ONCE DAILY WITH FOOD 90 tablet 3   Omega-3 Fatty Acids (OMEGA 3 PO) Take 1 tablet by mouth 2 (two) times daily.     OXYGEN Inhale 4 L into the lungs as needed.     potassium chloride SA (K-DUR,KLOR-CON) 20 MEQ tablet Take 20 mEq by mouth every evening.      rosuvastatin (CRESTOR) 20 MG tablet Take 20 mg by mouth daily.     tiotropium (SPIRIVA) 18 MCG inhalation capsule Place 18 mcg into inhaler and inhale daily.     TURMERIC EXTRA STRENGTH PO Take 1 tablet by mouth daily.     warfarin (COUMADIN) 1 MG tablet Take 1 mg by mouth every other day.     warfarin (COUMADIN) 5 MG tablet Take 5 mg by mouth daily at 8 pm.      No current facility-administered medications for this visit.     Past Medical History:  Diagnosis Date   Alcohol abuse    Arthritis    Atrial fibrillation (HCC) 08/02/2018   CHF (congestive heart failure) (HCC) 03/12/2017   Chronic atrial fibrillation (HCC) 03/12/2017   Chronic respiratory failure (HCC)    COPD (chronic obstructive pulmonary disease) (HCC) 03/12/2017   Dyspnea    wears Oxygen 24/7 3 L   Essential hypertension 03/12/2017   High risk medication use 05/05/2017   Hyperlipidemia 03/12/2017   Hypertensive heart disease with heart failure (HCC) 03/12/2017   ICD (implantable cardioverter-defibrillator) in place 07/30/2018   Ischemic cardiomyopathy 03/12/2017   Long term (current) use of anticoagulants 03/12/2017   Peripheral vascular disease (HCC) 03/12/2017   Preoperative cardiovascular examination 03/25/2015   Presence of combination internal cardiac defibrillator (ICD) and pacemaker 03/12/2017   unable to place a CRT    Type 2 diabetes mellitus without complications (HCC) 03/12/2017    ROS:   All systems reviewed and negative except as noted in the HPI.   Past Surgical History:  Procedure Laterality Date   AV NODE ABLATION N/A 08/02/2018  Procedure: AV NODE ABLATION;  Surgeon: Marinus Maw, MD;  Location: [facility] INVASIVE CV LAB;  Service: Cardiovascular;  Laterality: N/A;   BIV ICD INSERTION CRT-D N/A 07/30/2018   Procedure: BIV ICD INSERTION CRT-D;  Surgeon: Marinus Maw, MD;  Location: [facility] OR;  Service: Cardiovascular;  Laterality: N/A;   BIV PACEMAKER GENERATOR CHANGEOUT N/A 05/27/2018   Procedure: BIV PACEMAKER GENERATOR UPGRADE - Aborted;  Surgeon: Regan Lemming, MD;  Location: [facility] INVASIVE CV LAB;  Service: Cardiovascular;  Laterality: N/A;   CHOLECYSTECTOMY     COLONOSCOPY     ERCP     stone removal   EXCISION OF BREAST BIOPSY     Age 6, benign   PACEMAKER LEAD REMOVAL N/A 07/30/2018   Procedure: PACEMAKER LEAD REMOVAL;  Surgeon: Marinus Maw, MD;   Location: [facility] OR;  Service: Cardiovascular;  Laterality: N/A;     Family History  Problem Relation Age of Onset   Breast cancer Sister    Alcohol abuse Brother    Alcohol abuse Brother      Social History   Socioeconomic History   Marital status: Divorced    Spouse name: Not on file   Number of children: Not on file   Years of education: Not on file   Highest education level: Not on file  Occupational History   Not on file  Tobacco Use   Smoking status: Former    Current packs/day: 0.00    Average packs/day: 1 pack/day for 61.0 years (61.0 ttl pk-yrs)    Types: Cigarettes    Start date: 08/1957    Quit date: 08/2018    Years since quitting: 4.6   Smokeless tobacco: Never  Vaping Use   Vaping status: Never Used  Substance and Sexual Activity   Alcohol use: No   Drug use: No   Sexual activity: Not on file  Other Topics Concern   Not on file  Social History Narrative   Not on file   Social Determinants of Health   Financial Resource Strain: Not on file  Food Insecurity: Not on file  Transportation Needs: Not on file  Physical Activity: Not on file  Stress: Not on file  Social Connections: Not on file  Intimate Partner Violence: Not on file     BP 110/62   Pulse 82   Ht 5\' 7"  (1.702 m)   Wt 201 lb 9.6 oz (91.4 kg)   SpO2 95%   BMI 31.58 kg/m   Physical Exam:  Chronically ill appearing NAD HEENT: Unremarkable Neck:  No JVD, no thyromegally Lymphatics:  No adenopathy Back:  No CVA tenderness Lungs:  Clear with occasional rales. HEART:  Regular rate rhythm, no murmurs, no rubs, no clicks Abd:  soft, positive bowel sounds, no organomegally, no rebound, no guarding Ext:  2 plus pulses, no edema, no cyanosis, no clubbing Skin:  No rashes no nodules Neuro:  CN II through XII intact, motor grossly intact  EKG - afib with ventricular pacing  DEVICE  Normal device function.  See PaceArt for details.   Assess/Plan:   Atrial fib - his VR is well  controlled. He will continue his current meds. 2. Coags - he will continue warfarin 3. HTN -his bp is well controlled. 4. Dyslipidemia - he will continue simvastatin. 5. ICD - his biv is working normally. He will need to proceed with ICD gen change out as he is approaching ERI.    Jaime Gowda Brynlee Pennywell,MD

## 2023-04-14 NOTE — Progress Notes (Signed)
Pt may schedule Gen change. Does not need additional appointment.

## 2023-04-22 NOTE — Progress Notes (Unsigned)
Cardiology Office Note:    Date:  04/23/2023   ID:  MAASAI TURTURRO, DOB [birth year], MRN [id number]  PCP:  Alinda Deem, MD  Cardiologist:  Norman Herrlich, MD    Referring MD: Alinda Deem, MD    ASSESSMENT:    1. Chronic atrial fibrillation (HCC)   2. Long term (current) use of anticoagulants   3. 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 ICD (implantable cardioverter-defibrillator) in place   5. Chronic obstructive pulmonary disease, unspecified COPD type (HCC)   6. Mixed hyperlipidemia    PLAN:    In order of problems listed above:  ***   Next appointment: ***   Medication Adjustments/Labs and Tests Ordered: Current medicines are reviewed at length with the patient today.  Concerns regarding medicines are outlined above.  No orders of the defined types were placed in this encounter.  No orders of the defined types were placed in this encounter.    History of Present Illness:    PLEAS NEWLON is a 72 y.o. 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  Last device check*04/14/2023 showed biventricular pacing 71% of the time normal lead and battery parameters.  Estimated battery life 3 months. Compliance with diet, lifestyle and medications: *** Past Medical History:  Diagnosis Date   Alcohol abuse    Arthritis    Atrial fibrillation (HCC) 08/02/2018   CHF (congestive heart failure) (HCC) 03/12/2017   Chronic atrial fibrillation (HCC) 03/12/2017   Chronic respiratory failure (HCC)    COPD (chronic obstructive pulmonary disease) (HCC) 03/12/2017   Dyspnea    wears Oxygen 24/7 3 L   Essential hypertension 03/12/2017   High risk medication use 05/05/2017   Hyperlipidemia 03/12/2017   Hypertensive heart disease with heart failure (HCC) 03/12/2017   ICD  (implantable cardioverter-defibrillator) in place 07/30/2018   Ischemic cardiomyopathy 03/12/2017   Long term (current) use of anticoagulants 03/12/2017   Peripheral vascular disease (HCC) 03/12/2017   Preoperative cardiovascular examination 03/25/2015   Presence of combination internal cardiac defibrillator (ICD) and pacemaker 03/12/2017   unable to place a CRT    Type 2 diabetes mellitus without complications (HCC) 03/12/2017    Current Medications: No outpatient medications have been marked as taking for the 04/23/23 encounter (Appointment) with Baldo Daub, MD.      EKGs/Labs/Other Studies Reviewed:    The following studies were reviewed today:  Cardiac Studies & Procedures     STRESS TESTS  MYOCARDIAL PERFUSION IMAGING 03/25/2017   ECHOCARDIOGRAM  ECHOCARDIOGRAM COMPLETE 10/07/2021  Narrative ECHOCARDIOGRAM REPORT    Patient Name:   HOWIE BETTON Date of Exam: 10/07/2021 Medical Rec #:  [id number]         Height:       67.0 in Accession #:    [id number]        Weight:       229.2 lb Date of Birth:  May 15, 1951         BSA:          2.143 m Patient Age:    71 years          BP:           92/58 mmHg Patient Gender: M                 HR:  83 bpm. Exam Location:  [place]  Procedure: 2D Echo, Cardiac Doppler, Color Doppler and Strain Analysis  Indications:    CHF-Acute Systolic I50.21  History:        Patient has no prior history of Echocardiogram examinations. Dilated cardiomyopathy, Pacemaker, COPD and PAD, Arrythmias:Atrial Fibrillation and LBBB, Signs/Symptoms:Dyspnea; Risk Factors:Dyslipidemia and Hypertension.  Sonographer:    Louie Boston RDCS Referring Phys: [id number] Mariam Dollar [facility]  IMPRESSIONS   1. GLS -9.9. Left ventricular ejection fraction, by estimation, is 45 to 50%. The left ventricle has mildly decreased function. The left ventricle has no regional wall motion abnormalities. The left ventricular internal cavity size was mildly  dilated. Left ventricular diastolic parameters are indeterminate. 2. Right ventricular systolic function is normal. The right ventricular size is normal. There is normal pulmonary artery systolic pressure. 3. Left atrial size was moderately dilated. 4. Right atrial size was mildly dilated. 5. The mitral valve is normal in structure. Mild mitral valve regurgitation. No evidence of mitral stenosis. 6. The aortic valve is normal in structure. Aortic valve regurgitation is mild. No aortic stenosis is present. 7. There is moderate dilatation of the ascending aorta, measuring 41 mm. 8. The inferior vena cava is normal in size with greater than 50% respiratory variability, suggesting right atrial pressure of 3 mmHg.  FINDINGS Left Ventricle: GLS -9.9. Left ventricular ejection fraction, by estimation, is 45 to 50%. The left ventricle has mildly decreased function. The left ventricle has no regional wall motion abnormalities. The left ventricular internal cavity size was mildly dilated. There is no left ventricular hypertrophy. Abnormal (paradoxical) septal motion, consistent with RV pacemaker. Left ventricular diastolic parameters are indeterminate.  Right Ventricle: The right ventricular size is normal. No increase in right ventricular wall thickness. Right ventricular systolic function is normal. There is normal pulmonary artery systolic pressure. The tricuspid regurgitant velocity is 2.43 m/s, and with an assumed right atrial pressure of 8 mmHg, the estimated right ventricular systolic pressure is 31.6 mmHg.  Left Atrium: Left atrial size was moderately dilated.  Right Atrium: Right atrial size was mildly dilated.  Pericardium: There is no evidence of pericardial effusion.  Mitral Valve: The mitral valve is normal in structure. Mild mitral valve regurgitation. No evidence of mitral valve stenosis.  Tricuspid Valve: The tricuspid valve is normal in structure. Tricuspid valve regurgitation is mild  . No evidence of tricuspid stenosis.  Aortic Valve: The aortic valve is normal in structure. Aortic valve regurgitation is mild. Aortic regurgitation PHT measures 660 msec. No aortic stenosis is present.  Pulmonic Valve: The pulmonic valve was normal in structure. Pulmonic valve regurgitation is not visualized. No evidence of pulmonic stenosis.  Aorta: The aortic root is normal in size and structure. There is moderate dilatation of the ascending aorta, measuring 41 mm.  Venous: The inferior vena cava is normal in size with greater than 50% respiratory variability, suggesting right atrial pressure of 3 mmHg.  IAS/Shunts: No atrial level shunt detected by color flow Doppler.  Additional Comments: A device lead is visualized.   LEFT VENTRICLE PLAX 2D LVIDd:         5.90 cm      Diastology LVIDs:         4.50 cm      LV e' medial:    7.94 cm/s LV PW:         1.10 cm      LV E/e' medial:  12.0 LV IVS:        1.00 cm  LV e' lateral:   12.00 cm/s LVOT diam:     2.00 cm      LV E/e' lateral: 8.0 LV SV:         56 LV SV Index:   26 LVOT Area:     3.14 cm  LV Volumes (MOD) LV vol d, MOD A2C: 108.0 ml LV vol d, MOD A4C: 113.0 ml LV vol s, MOD A2C: 54.1 ml LV vol s, MOD A4C: 63.7 ml LV SV MOD A2C:     53.9 ml LV SV MOD A4C:     113.0 ml LV SV MOD BP:      52.5 ml  RIGHT VENTRICLE            IVC RV S prime:     9.57 cm/s  IVC diam: 2.60 cm TAPSE (M-mode): 1.9 cm  LEFT ATRIUM              Index        RIGHT ATRIUM           Index LA diam:        4.70 cm  2.19 cm/m   RA Area:     21.80 cm LA Vol (A2C):   110.0 ml 51.33 ml/m  RA Volume:   72.70 ml  33.93 ml/m LA Vol (A4C):   101.0 ml 47.13 ml/m LA Biplane Vol: 110.0 ml 51.33 ml/m AORTIC VALVE LVOT Vmax:   103.00 cm/s LVOT Vmean:  66.600 cm/s LVOT VTI:    0.177 m AI PHT:      660 msec  AORTA Ao Root diam: 3.60 cm Ao Asc diam:  4.10 cm  MITRAL VALVE               TRICUSPID VALVE MV Area (PHT): 3.53 cm    TR Peak grad:    23.6 mmHg MV Decel Time: 215 msec    TR Vmax:        243.00 cm/s MV E velocity: 95.50 cm/s MV A velocity: 25.10 cm/s  SHUNTS MV E/A ratio:  3.80        Systemic VTI:  0.18 m Systemic Diam: 2.00 cm  Gypsy Balsam MD Electronically signed by Gypsy Balsam MD Signature Date/Time: 10/07/2021/12:32:46 PM    Final                 Recent Labs: No results found for requested labs within last 365 days.  Recent Lipid Panel No results found for: "CHOL", "TRIG", "HDL", "CHOLHDL", "VLDL", "LDLCALC", "LDLDIRECT"  Physical Exam:    VS:  There were no vitals taken for this visit.    Wt Readings from Last 3 Encounters:  04/14/23 201 lb 9.6 oz (91.4 kg)  10/20/22 209 lb 12.8 oz (95.2 kg)  10/09/22 212 lb 3.2 oz (96.3 kg)     GEN: *** Well nourished, well developed in no acute distress HEENT: Normal NECK: No JVD; No carotid bruits LYMPHATICS: No lymphadenopathy CARDIAC: ***RRR, no murmurs, rubs, gallops RESPIRATORY:  Clear to auscultation without rales, wheezing or rhonchi  ABDOMEN: Soft, non-tender, non-distended MUSCULOSKELETAL:  No edema; No deformity  SKIN: Warm and dry NEUROLOGIC:  Alert and oriented x 3 PSYCHIATRIC:  Normal affect    Signed, Norman Herrlich, MD  04/23/2023 7:29 AM    [facility]

## 2023-04-23 ENCOUNTER — Encounter: Payer: Self-pay | Admitting: Cardiology

## 2023-04-23 ENCOUNTER — Ambulatory Visit: Payer: Medicare Other | Attending: Cardiology | Admitting: Cardiology

## 2023-04-23 VITALS — BP 122/80 | HR 66 | Ht 67.0 in | Wt 201.6 lb

## 2023-04-23 DIAGNOSIS — J449 Chronic obstructive pulmonary disease, unspecified: Secondary | ICD-10-CM | POA: Diagnosis not present

## 2023-04-23 DIAGNOSIS — E782 Mixed hyperlipidemia: Secondary | ICD-10-CM | POA: Diagnosis not present

## 2023-04-23 DIAGNOSIS — I482 Chronic atrial fibrillation, unspecified: Secondary | ICD-10-CM

## 2023-04-23 DIAGNOSIS — I5022 Chronic systolic (congestive) heart failure: Secondary | ICD-10-CM | POA: Diagnosis not present

## 2023-04-23 DIAGNOSIS — Z7901 Long term (current) use of anticoagulants: Secondary | ICD-10-CM | POA: Diagnosis not present

## 2023-04-23 DIAGNOSIS — Z95810 Presence of automatic (implantable) cardiac defibrillator: Secondary | ICD-10-CM

## 2023-04-23 DIAGNOSIS — I110 Hypertensive heart disease with heart failure: Secondary | ICD-10-CM | POA: Diagnosis not present

## 2023-04-23 NOTE — Patient Instructions (Signed)
Medication Instructions:  Your physician recommends that you continue on your current medications as directed. Please refer to the Current Medication list given to you today.  *If you need a refill on your cardiac medications before your next appointment, please call your pharmacy*   Lab Work: None If you have labs (blood work) drawn today and your tests are completely normal, you will receive your results only by: MyChart Message (if you have MyChart) OR A paper copy in the mail If you have any lab test that is abnormal or we need to change your treatment, we will call you to review the results.   Testing/Procedures: None   Follow-Up: At [facility], you and your health needs are our priority.  As part of our continuing mission to provide you with exceptional heart care, we have created designated Provider Care Teams.  These Care Teams include your primary Cardiologist (physician) and Advanced Practice Providers (APPs -  Physician Assistants and Nurse Practitioners) who all work together to provide you with the care you need, when you need it.  We recommend signing up for the patient portal called "MyChart".  Sign up information is provided on this After Visit Summary.  MyChart is used to connect with patients for Virtual Visits (Telemedicine).  Patients are able to view lab/test results, encounter notes, upcoming appointments, etc.  Non-urgent messages can be sent to your provider as well.   To learn more about what you can do with MyChart, go to [email or web address].    Your next appointment:   6 month(s)  Provider:   Brian Munley, MD    Other Instructions None  

## 2023-04-23 NOTE — Progress Notes (Signed)
Cardiology Office Note:    Date:  04/23/2023   ID:  JAEDEN SCHNITTKER, DOB 12/19/1950, MRN [id number]  PCP:  Alinda Deem, MD  Cardiologist:  Norman Herrlich, MD    Referring MD: Alinda Deem, MD    ASSESSMENT:    1. Chronic atrial fibrillation (HCC)   2. Long term (current) use of anticoagulants   3. 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 ICD (implantable cardioverter-defibrillator) in place   5. Chronic obstructive pulmonary disease, unspecified COPD type (HCC)   6. Mixed hyperlipidemia    PLAN:    In order of problems listed above:  [place] is markedly improved functionally with complete loss exercise tolerance better no longer having back and joint pain not limiting his activities and is getting adequate activity each day He is 100% ventricularly paced and continues warfarin for stroke prophylaxis he is chosen not to take a DOAC INR is at target. Blood pressure at target doing well with the medical regimen including his loop diuretic MRA Toprol-XL and Vasotec he has declined Entresto Stable pacemaker device function he is now projected 3 months from generator replacement Lipids at target continue his high intensity statin lipid profile 04/02/2023 cholesterol 128 LDL 64 hemoglobin 11.8 and potassium 3.7 Improved COPD he will continue his current bronchodilator   Next appointment: Plan to see him in 6 months   Medication Adjustments/Labs and Tests Ordered: Current medicines are reviewed at length with the patient today.  Concerns regarding medicines are outlined above.  Orders Placed This Encounter  Procedures   EKG 12-Lead   No orders of the defined types were placed in this encounter.    History of Present Illness:    ROMIN FREESE is a 72 y.o. male with a hx of chronic atrial fibrillation on long-term anticoagulation systolic heart failure not on beta-blocker due to his underlying COPD ICD hypertensive heart disease  and CLL PAD last seen 10/20/2022.   Last device check*04/14/2023 showed biventricular pacing 71% of the time normal lead and battery parameters.  Estimated battery life 3 months . Compliance with diet, lifestyle and medications: He certainly looks markedly improved compliant with his medications take an anti-inflammatory supplement over-the-counter and attributes much of his improvement to this  His INR is in target very rarely requires a dose adjustment has had no bleeding complication [place] is lost a total of 50 pounds He is not having a further joint or back pain he is walking more and gets at least 20 minutes of activity a day and with his ambulatory oxygen is not short of breath no edema orthopnea palpitation or syncope Last ejection fraction March 2000 2345 to 50% Past Medical History:  Diagnosis Date   Alcohol abuse    Arthritis    Atrial fibrillation (HCC) 08/02/2018   CHF (congestive heart failure) (HCC) 03/12/2017   Chronic atrial fibrillation (HCC) 03/12/2017   Chronic respiratory failure (HCC)    COPD (chronic obstructive pulmonary disease) (HCC) 03/12/2017   Dyspnea    wears Oxygen 24/7 3 L   Essential hypertension 03/12/2017   High risk medication use 05/05/2017   Hyperlipidemia 03/12/2017   Hypertensive heart disease with heart failure (HCC) 03/12/2017   ICD (implantable cardioverter-defibrillator) in place 07/30/2018   Ischemic cardiomyopathy 03/12/2017   Long term (current) use of anticoagulants 03/12/2017   Peripheral vascular disease (HCC) 03/12/2017   Preoperative cardiovascular examination 03/25/2015   Presence of combination internal cardiac defibrillator (ICD) and pacemaker 03/12/2017   unable  to place a CRT    Type 2 diabetes mellitus without complications (HCC) 03/12/2017    Current Medications: Current Meds  Medication Sig   Cyanocobalamin (B-12) 1000 MCG TABS Take 1,000 mcg by mouth daily.   diphenhydrAMINE (BENADRYL) 25 mg capsule Take 25 mg by mouth 2 (two) times  daily.   enalapril (VASOTEC) 2.5 MG tablet Take 2.5 mg by mouth daily.   eplerenone (INSPRA) 25 MG tablet Take 1 tablet (25 mg total) by mouth daily.   ferrous sulfate 324 MG TBEC Take 324 mg by mouth daily.   furosemide (LASIX) 80 MG tablet Take 80 mg by mouth as directed. 2 tablets in the morning and 1 tablets in the evening   metFORMIN (GLUCOPHAGE-XR) 500 MG 24 hr tablet Take 1,000 mg by mouth 2 (two) times daily.    metoprolol succinate (TOPROL-XL) 25 MG 24 hr tablet TAKE 1 TABLET BY MOUTH ONCE DAILY WITH FOOD   Omega-3 Fatty Acids (OMEGA 3 PO) Take 1 tablet by mouth 2 (two) times daily.   OXYGEN Inhale 4 L into the lungs as needed.   potassium chloride SA (K-DUR,KLOR-CON) 20 MEQ tablet Take 20 mEq by mouth every evening.    rosuvastatin (CRESTOR) 20 MG tablet Take 20 mg by mouth daily.   tiotropium (SPIRIVA) 18 MCG inhalation capsule Place 18 mcg into inhaler and inhale daily.   TURMERIC EXTRA STRENGTH PO Take 1 tablet by mouth daily.   warfarin (COUMADIN) 1 MG tablet Take 1 mg by mouth every other day.   warfarin (COUMADIN) 5 MG tablet Take 5 mg by mouth daily at 8 pm.      EKGs/Labs/Other Studies Reviewed:    The following studies were reviewed today:  Cardiac Studies & Procedures     STRESS TESTS  MYOCARDIAL PERFUSION IMAGING 03/25/2017   ECHOCARDIOGRAM  ECHOCARDIOGRAM COMPLETE 10/07/2021  Narrative ECHOCARDIOGRAM REPORT    Patient Name:   GREGORIO GANTZ Date of Exam: 10/07/2021 Medical Rec #:  [id number]         Height:       67.0 in Accession #:    [id number]        Weight:       229.2 lb Date of Birth:  09-Apr-1951         BSA:          2.143 m Patient Age:    71 years          BP:           92/58 mmHg Patient Gender: M                 HR:           83 bpm. Exam Location:  [place]  Procedure: 2D Echo, Cardiac Doppler, Color Doppler and Strain Analysis  Indications:    CHF-Acute Systolic I50.21  History:        Patient has no prior history of Echocardiogram  examinations. Dilated cardiomyopathy, Pacemaker, COPD and PAD, Arrythmias:Atrial Fibrillation and LBBB, Signs/Symptoms:Dyspnea; Risk Factors:Dyslipidemia and Hypertension.  Sonographer:    Louie Boston RDCS Referring Phys: [id number] Mariam Dollar [facility]  IMPRESSIONS   1. GLS -9.9. Left ventricular ejection fraction, by estimation, is 45 to 50%. The left ventricle has mildly decreased function. The left ventricle has no regional wall motion abnormalities. The left ventricular internal cavity size was mildly dilated. Left ventricular diastolic parameters are indeterminate. 2. Right ventricular systolic function is normal. The right ventricular size is normal. There is normal pulmonary  artery systolic pressure. 3. Left atrial size was moderately dilated. 4. Right atrial size was mildly dilated. 5. The mitral valve is normal in structure. Mild mitral valve regurgitation. No evidence of mitral stenosis. 6. The aortic valve is normal in structure. Aortic valve regurgitation is mild. No aortic stenosis is present. 7. There is moderate dilatation of the ascending aorta, measuring 41 mm. 8. The inferior vena cava is normal in size with greater than 50% respiratory variability, suggesting right atrial pressure of 3 mmHg.  FINDINGS Left Ventricle: GLS -9.9. Left ventricular ejection fraction, by estimation, is 45 to 50%. The left ventricle has mildly decreased function. The left ventricle has no regional wall motion abnormalities. The left ventricular internal cavity size was mildly dilated. There is no left ventricular hypertrophy. Abnormal (paradoxical) septal motion, consistent with RV pacemaker. Left ventricular diastolic parameters are indeterminate.  Right Ventricle: The right ventricular size is normal. No increase in right ventricular wall thickness. Right ventricular systolic function is normal. There is normal pulmonary artery systolic pressure. The tricuspid regurgitant velocity is 2.43 m/s,  and with an assumed right atrial pressure of 8 mmHg, the estimated right ventricular systolic pressure is 31.6 mmHg.  Left Atrium: Left atrial size was moderately dilated.  Right Atrium: Right atrial size was mildly dilated.  Pericardium: There is no evidence of pericardial effusion.  Mitral Valve: The mitral valve is normal in structure. Mild mitral valve regurgitation. No evidence of mitral valve stenosis.  Tricuspid Valve: The tricuspid valve is normal in structure. Tricuspid valve regurgitation is mild . No evidence of tricuspid stenosis.  Aortic Valve: The aortic valve is normal in structure. Aortic valve regurgitation is mild. Aortic regurgitation PHT measures 660 msec. No aortic stenosis is present.  Pulmonic Valve: The pulmonic valve was normal in structure. Pulmonic valve regurgitation is not visualized. No evidence of pulmonic stenosis.  Aorta: The aortic root is normal in size and structure. There is moderate dilatation of the ascending aorta, measuring 41 mm.  Venous: The inferior vena cava is normal in size with greater than 50% respiratory variability, suggesting right atrial pressure of 3 mmHg.  IAS/Shunts: No atrial level shunt detected by color flow Doppler.  Additional Comments: A device lead is visualized.   LEFT VENTRICLE PLAX 2D LVIDd:         5.90 cm      Diastology LVIDs:         4.50 cm      LV e' medial:    7.94 cm/s LV PW:         1.10 cm      LV E/e' medial:  12.0 LV IVS:        1.00 cm      LV e' lateral:   12.00 cm/s LVOT diam:     2.00 cm      LV E/e' lateral: 8.0 LV SV:         56 LV SV Index:   26 LVOT Area:     3.14 cm  LV Volumes (MOD) LV vol d, MOD A2C: 108.0 ml LV vol d, MOD A4C: 113.0 ml LV vol s, MOD A2C: 54.1 ml LV vol s, MOD A4C: 63.7 ml LV SV MOD A2C:     53.9 ml LV SV MOD A4C:     113.0 ml LV SV MOD BP:      52.5 ml  RIGHT VENTRICLE            IVC RV S prime:  9.57 cm/s  IVC diam: 2.60 cm TAPSE (M-mode): 1.9 cm  LEFT  ATRIUM              Index        RIGHT ATRIUM           Index LA diam:        4.70 cm  2.19 cm/m   RA Area:     21.80 cm LA Vol (A2C):   110.0 ml 51.33 ml/m  RA Volume:   72.70 ml  33.93 ml/m LA Vol (A4C):   101.0 ml 47.13 ml/m LA Biplane Vol: 110.0 ml 51.33 ml/m AORTIC VALVE LVOT Vmax:   103.00 cm/s LVOT Vmean:  66.600 cm/s LVOT VTI:    0.177 m AI PHT:      660 msec  AORTA Ao Root diam: 3.60 cm Ao Asc diam:  4.10 cm  MITRAL VALVE               TRICUSPID VALVE MV Area (PHT): 3.53 cm    TR Peak grad:   23.6 mmHg MV Decel Time: 215 msec    TR Vmax:        243.00 cm/s MV E velocity: 95.50 cm/s MV A velocity: 25.10 cm/s  SHUNTS MV E/A ratio:  3.80        Systemic VTI:  0.18 m Systemic Diam: 2.00 cm  Gypsy Balsam MD Electronically signed by Gypsy Balsam MD Signature Date/Time: 10/07/2021/12:32:46 PM    Final             EKG Interpretation Date/Time:  Thursday April 23 2023 10:31:45 EDT Ventricular Rate:  66 PR Interval:    QRS Duration:  170 QT Interval:  482 QTC Calculation: 505 R Axis:   -71  Text Interpretation: Ventricular-paced rhythm When compared with ECG of 14-Apr-2023 10:08, Vent. rate has decreased BY  16 BPM Confirmed by Norman Herrlich ([id number]) on 04/23/2023 10:35:16 AM   Recent Labs: No results found for requested labs within last 365 days.  Recent Lipid Panel No results found for: "CHOL", "TRIG", "HDL", "CHOLHDL", "VLDL", "LDLCALC", "LDLDIRECT"  Physical Exam:    VS:  BP 122/80 (BP Location: Left Arm, Patient Position: Sitting, Cuff Size: Normal)   Pulse 66   Ht 5\' 7"  (1.702 m)   Wt 201 lb 9.6 oz (91.4 kg)   SpO2 92%   BMI 31.58 kg/m     Wt Readings from Last 3 Encounters:  04/23/23 201 lb 9.6 oz (91.4 kg)  04/14/23 201 lb 9.6 oz (91.4 kg)  10/20/22 209 lb 12.8 oz (95.2 kg)     GEN: Marked improvement in appearance no longer looks chronically ill or debilitated well nourished, well developed in no acute distress HEENT:  Normal NECK: No JVD; No carotid bruits LYMPHATICS: No lymphadenopathy CARDIAC: RRR, no murmurs, rubs, gallops RESPIRATORY:  Clear to auscultation with diminished breath sounds ABDOMEN: Soft, non-tender, non-distended MUSCULOSKELETAL:  No edema; No deformity  SKIN: Warm and dry NEUROLOGIC:  Alert and oriented x 3 PSYCHIATRIC:  Normal affect    Signed, Norman Herrlich, MD  04/23/2023 10:44 AM    [facility]

## 2023-04-27 DIAGNOSIS — I482 Chronic atrial fibrillation, unspecified: Secondary | ICD-10-CM | POA: Diagnosis not present

## 2023-04-27 DIAGNOSIS — I4891 Unspecified atrial fibrillation: Secondary | ICD-10-CM | POA: Diagnosis not present

## 2023-04-27 DIAGNOSIS — E1169 Type 2 diabetes mellitus with other specified complication: Secondary | ICD-10-CM | POA: Diagnosis not present

## 2023-04-27 DIAGNOSIS — Z79899 Other long term (current) drug therapy: Secondary | ICD-10-CM | POA: Diagnosis not present

## 2023-04-29 DIAGNOSIS — I482 Chronic atrial fibrillation, unspecified: Secondary | ICD-10-CM | POA: Diagnosis not present

## 2023-04-29 DIAGNOSIS — Z7901 Long term (current) use of anticoagulants: Secondary | ICD-10-CM | POA: Diagnosis not present

## 2023-05-14 ENCOUNTER — Ambulatory Visit (INDEPENDENT_AMBULATORY_CARE_PROVIDER_SITE_OTHER): Payer: Medicare Other

## 2023-05-14 DIAGNOSIS — I255 Ischemic cardiomyopathy: Secondary | ICD-10-CM

## 2023-05-14 LAB — CUP PACEART REMOTE DEVICE CHECK
Battery Remaining Longevity: 2 mo
Battery Voltage: 2.74 V
Brady Statistic AP VP Percent: 0 %
Brady Statistic AP VS Percent: 0 %
Brady Statistic AS VP Percent: 0 %
Brady Statistic AS VS Percent: 0 %
Brady Statistic RA Percent Paced: 0 %
Brady Statistic RV Percent Paced: 99.78 %
Date Time Interrogation Session: 20241017001804
HighPow Impedance: 73 Ohm
Implantable Lead Connection Status: 753985
Implantable Lead Connection Status: 753985
Implantable Lead Implant Date: 20200103
Implantable Lead Implant Date: 20200103
Implantable Lead Location: 753858
Implantable Lead Location: 753860
Implantable Lead Model: 4598
Implantable Lead Model: 6935
Implantable Pulse Generator Implant Date: 20200103
Lead Channel Impedance Value: 142.5 Ohm
Lead Channel Impedance Value: 142.5 Ohm
Lead Channel Impedance Value: 162.857
Lead Channel Impedance Value: 162.857
Lead Channel Impedance Value: 162.857
Lead Channel Impedance Value: 285 Ohm
Lead Channel Impedance Value: 285 Ohm
Lead Channel Impedance Value: 285 Ohm
Lead Channel Impedance Value: 380 Ohm
Lead Channel Impedance Value: 380 Ohm
Lead Channel Impedance Value: 399 Ohm
Lead Channel Impedance Value: 4047 Ohm
Lead Channel Impedance Value: 456 Ohm
Lead Channel Impedance Value: 494 Ohm
Lead Channel Impedance Value: 494 Ohm
Lead Channel Impedance Value: 570 Ohm
Lead Channel Impedance Value: 570 Ohm
Lead Channel Impedance Value: 570 Ohm
Lead Channel Pacing Threshold Amplitude: 0.5 V
Lead Channel Pacing Threshold Amplitude: 3.75 V
Lead Channel Pacing Threshold Pulse Width: 0.4 ms
Lead Channel Pacing Threshold Pulse Width: 1 ms
Lead Channel Sensing Intrinsic Amplitude: 16.625 mV
Lead Channel Sensing Intrinsic Amplitude: 16.625 mV
Lead Channel Setting Pacing Amplitude: 2.5 V
Lead Channel Setting Pacing Amplitude: 3.25 V
Lead Channel Setting Pacing Pulse Width: 0.4 ms
Lead Channel Setting Pacing Pulse Width: 1 ms
Lead Channel Setting Sensing Sensitivity: 0.3 mV
Zone Setting Status: 755011

## 2023-05-18 ENCOUNTER — Other Ambulatory Visit: Payer: Self-pay | Admitting: Cardiology

## 2023-05-18 NOTE — Telephone Encounter (Signed)
Rx refill sent to pharmacy. 

## 2023-05-28 DIAGNOSIS — E1169 Type 2 diabetes mellitus with other specified complication: Secondary | ICD-10-CM | POA: Diagnosis not present

## 2023-05-28 DIAGNOSIS — I482 Chronic atrial fibrillation, unspecified: Secondary | ICD-10-CM | POA: Diagnosis not present

## 2023-05-28 DIAGNOSIS — Z79899 Other long term (current) drug therapy: Secondary | ICD-10-CM | POA: Diagnosis not present

## 2023-05-28 DIAGNOSIS — E7800 Pure hypercholesterolemia, unspecified: Secondary | ICD-10-CM | POA: Diagnosis not present

## 2023-05-28 DIAGNOSIS — I10 Essential (primary) hypertension: Secondary | ICD-10-CM | POA: Diagnosis not present

## 2023-06-02 DIAGNOSIS — Z7901 Long term (current) use of anticoagulants: Secondary | ICD-10-CM | POA: Diagnosis not present

## 2023-06-02 DIAGNOSIS — I482 Chronic atrial fibrillation, unspecified: Secondary | ICD-10-CM | POA: Diagnosis not present

## 2023-06-02 NOTE — Progress Notes (Signed)
Remote ICD transmission.   

## 2023-06-10 DIAGNOSIS — H04123 Dry eye syndrome of bilateral lacrimal glands: Secondary | ICD-10-CM | POA: Diagnosis not present

## 2023-06-10 DIAGNOSIS — E119 Type 2 diabetes mellitus without complications: Secondary | ICD-10-CM | POA: Diagnosis not present

## 2023-06-15 ENCOUNTER — Telehealth: Payer: Self-pay | Admitting: Cardiology

## 2023-06-15 ENCOUNTER — Telehealth: Payer: Self-pay

## 2023-06-15 NOTE — Telephone Encounter (Signed)
Outpatient service line: Chief complaint: Device notification  Patient has [employer] device, with alarm that went off this morning.  Reviewed his interrogation, no acute events.  Likely this is related to his low battery life and has generation change next month.  He has instructions to call if he has any concerning symptoms.  Denied any chest pain, shortness of breath, palpitations or peripheral edema.

## 2023-06-15 NOTE — Telephone Encounter (Signed)
Alert received from [employer] for RRT reached 06/14/23.   Patient called to advised ICD @ RRT. 06/11/23.  Did not discuss with Renee, PA procedure for gen change. Pt will need apt with EP APP/MD to discuss.

## 2023-06-17 ENCOUNTER — Encounter: Payer: Self-pay | Admitting: Pulmonary Disease

## 2023-06-17 ENCOUNTER — Ambulatory Visit: Payer: Medicare Other | Attending: Internal Medicine

## 2023-06-17 ENCOUNTER — Ambulatory Visit (INDEPENDENT_AMBULATORY_CARE_PROVIDER_SITE_OTHER): Payer: Medicare Other | Admitting: Pulmonary Disease

## 2023-06-17 VITALS — BP 104/60 | HR 60 | Ht 67.0 in | Wt 200.2 lb

## 2023-06-17 DIAGNOSIS — E782 Mixed hyperlipidemia: Secondary | ICD-10-CM | POA: Diagnosis not present

## 2023-06-17 DIAGNOSIS — I255 Ischemic cardiomyopathy: Secondary | ICD-10-CM

## 2023-06-17 DIAGNOSIS — Z7901 Long term (current) use of anticoagulants: Secondary | ICD-10-CM

## 2023-06-17 DIAGNOSIS — I5020 Unspecified systolic (congestive) heart failure: Secondary | ICD-10-CM

## 2023-06-17 DIAGNOSIS — I428 Other cardiomyopathies: Secondary | ICD-10-CM | POA: Insufficient documentation

## 2023-06-17 DIAGNOSIS — Z01810 Encounter for preprocedural cardiovascular examination: Secondary | ICD-10-CM | POA: Insufficient documentation

## 2023-06-17 DIAGNOSIS — Z95810 Presence of automatic (implantable) cardiac defibrillator: Secondary | ICD-10-CM

## 2023-06-17 LAB — CUP PACEART REMOTE DEVICE CHECK
Battery Remaining Longevity: 1 mo
Battery Voltage: 2.71 V
Brady Statistic AP VP Percent: 0 %
Brady Statistic AP VS Percent: 0 %
Brady Statistic AS VP Percent: 0 %
Brady Statistic AS VS Percent: 0 %
Brady Statistic RA Percent Paced: 0 %
Brady Statistic RV Percent Paced: 99.5 %
Date Time Interrogation Session: 20241120023323
HighPow Impedance: 76 Ohm
Implantable Lead Connection Status: 753985
Implantable Lead Connection Status: 753985
Implantable Lead Implant Date: 20200103
Implantable Lead Implant Date: 20200103
Implantable Lead Location: 753858
Implantable Lead Location: 753860
Implantable Lead Model: 4598
Implantable Lead Model: 6935
Implantable Pulse Generator Implant Date: 20200103
Lead Channel Impedance Value: 142.5 Ohm
Lead Channel Impedance Value: 142.5 Ohm
Lead Channel Impedance Value: 155.455
Lead Channel Impedance Value: 155.455
Lead Channel Impedance Value: 155.455
Lead Channel Impedance Value: 285 Ohm
Lead Channel Impedance Value: 285 Ohm
Lead Channel Impedance Value: 285 Ohm
Lead Channel Impedance Value: 342 Ohm
Lead Channel Impedance Value: 399 Ohm
Lead Channel Impedance Value: 399 Ohm
Lead Channel Impedance Value: 4047 Ohm
Lead Channel Impedance Value: 456 Ohm
Lead Channel Impedance Value: 494 Ohm
Lead Channel Impedance Value: 494 Ohm
Lead Channel Impedance Value: 551 Ohm
Lead Channel Impedance Value: 570 Ohm
Lead Channel Impedance Value: 570 Ohm
Lead Channel Pacing Threshold Amplitude: 0.5 V
Lead Channel Pacing Threshold Amplitude: 3.25 V
Lead Channel Pacing Threshold Pulse Width: 0.4 ms
Lead Channel Pacing Threshold Pulse Width: 1 ms
Lead Channel Sensing Intrinsic Amplitude: 16.625 mV
Lead Channel Sensing Intrinsic Amplitude: 16.625 mV
Lead Channel Setting Pacing Amplitude: 2.5 V
Lead Channel Setting Pacing Amplitude: 3.25 V
Lead Channel Setting Pacing Pulse Width: 0.4 ms
Lead Channel Setting Pacing Pulse Width: 1 ms
Lead Channel Setting Sensing Sensitivity: 0.3 mV
Zone Setting Status: 755011

## 2023-06-17 LAB — CUP PACEART INCLINIC DEVICE CHECK
Battery Remaining Longevity: 1 mo
Battery Voltage: 2.72 V
Brady Statistic AP VP Percent: 0 %
Brady Statistic AP VS Percent: 0 %
Brady Statistic AS VP Percent: 0 %
Brady Statistic AS VS Percent: 0 %
Brady Statistic RA Percent Paced: 0 %
Brady Statistic RV Percent Paced: 99.5 %
Date Time Interrogation Session: 20241120141317
HighPow Impedance: 79 Ohm
HighPow Impedance: 79 Ohm
Implantable Lead Connection Status: 753985
Implantable Lead Connection Status: 753985
Implantable Lead Implant Date: 20200103
Implantable Lead Implant Date: 20200103
Implantable Lead Location: 753858
Implantable Lead Location: 753860
Implantable Lead Model: 4598
Implantable Lead Model: 6935
Implantable Pulse Generator Implant Date: 20200103
Lead Channel Impedance Value: 142.5 Ohm
Lead Channel Impedance Value: 142.5 Ohm
Lead Channel Impedance Value: 142.5 Ohm
Lead Channel Impedance Value: 142.5 Ohm
Lead Channel Impedance Value: 166.25 Ohm
Lead Channel Impedance Value: 166.25 Ohm
Lead Channel Impedance Value: 166.25 Ohm
Lead Channel Impedance Value: 166.25 Ohm
Lead Channel Impedance Value: 166.25 Ohm
Lead Channel Impedance Value: 166.25 Ohm
Lead Channel Impedance Value: 285 Ohm
Lead Channel Impedance Value: 285 Ohm
Lead Channel Impedance Value: 285 Ohm
Lead Channel Impedance Value: 285 Ohm
Lead Channel Impedance Value: 285 Ohm
Lead Channel Impedance Value: 285 Ohm
Lead Channel Impedance Value: 399 Ohm
Lead Channel Impedance Value: 399 Ohm
Lead Channel Impedance Value: 399 Ohm
Lead Channel Impedance Value: 399 Ohm
Lead Channel Impedance Value: 4047 Ohm
Lead Channel Impedance Value: 4047 Ohm
Lead Channel Impedance Value: 456 Ohm
Lead Channel Impedance Value: 456 Ohm
Lead Channel Impedance Value: 513 Ohm
Lead Channel Impedance Value: 513 Ohm
Lead Channel Impedance Value: 513 Ohm
Lead Channel Impedance Value: 513 Ohm
Lead Channel Impedance Value: 513 Ohm
Lead Channel Impedance Value: 513 Ohm
Lead Channel Impedance Value: 551 Ohm
Lead Channel Impedance Value: 551 Ohm
Lead Channel Impedance Value: 570 Ohm
Lead Channel Impedance Value: 570 Ohm
Lead Channel Impedance Value: 570 Ohm
Lead Channel Impedance Value: 570 Ohm
Lead Channel Pacing Threshold Amplitude: 0.5 V
Lead Channel Pacing Threshold Amplitude: 3.25 V
Lead Channel Pacing Threshold Amplitude: 3.25 V
Lead Channel Pacing Threshold Pulse Width: 0.4 ms
Lead Channel Pacing Threshold Pulse Width: 1 ms
Lead Channel Pacing Threshold Pulse Width: 1 ms
Lead Channel Sensing Intrinsic Amplitude: 16.625 mV
Lead Channel Sensing Intrinsic Amplitude: 16.625 mV
Lead Channel Setting Pacing Amplitude: 2.5 V
Lead Channel Setting Pacing Amplitude: 3.75 V
Lead Channel Setting Pacing Pulse Width: 0.4 ms
Lead Channel Setting Pacing Pulse Width: 1 ms
Lead Channel Setting Sensing Sensitivity: 0.3 mV
Zone Setting Status: 755011

## 2023-06-17 NOTE — Patient Instructions (Signed)
Lab Work: CBC, BMET today If you have labs (blood work) drawn today and your tests are completely normal, you will receive your results only by: MyChart Message (if you have MyChart) OR A paper copy in the mail If you have any lab test that is abnormal or we need to change your treatment, we will call you to review the results.  Testing/Procedures: ICD Generator change  Follow-Up: At [facility], you and your health needs are our priority.  As part of our continuing mission to provide you with exceptional heart care, we have created designated Provider Care Teams.  These Care Teams include your primary Cardiologist (physician) and Advanced Practice Providers (APPs -  Physician Assistants and Nurse Practitioners) who all work together to provide you with the care you need, when you need it.  We recommend signing up for the patient portal called "MyChart".  Sign up information is provided on this After Visit Summary.  MyChart is used to connect with patients for Virtual Visits (Telemedicine).  Patients are able to view lab/test results, encounter notes, upcoming appointments, etc.  Non-urgent messages can be sent to your provider as well.   To learn more about what you can do with MyChart, go to [email or web address].    Your next appointment:   Will be provided on hospital discharge paperwork  Provider:   Canary Brim, NP

## 2023-06-17 NOTE — Progress Notes (Signed)
Electrophysiology Office Note:   Date:  06/17/2023  ID:  Jaime Scott, DOB [birth year], MRN [id number]  Primary Cardiologist: None Electrophysiologist: [name], MD       History of Present Illness:   Jaime Scott is a 72 y.o. male with h/o permanent AF s/p AV nodal ablation, LBBB, dilated cardiomyopathy / ICM s/p ICD, HTN, HLD, PVD, COPD, OSA, DM II, Tobacco / ETOH abuse seen today for routine electrophysiology followup.   Since last being seen in our clinic the patient reports his device has been going off at home. He is feeling well otherwise.  Remote device check showed he reached RRT as of 06/14/23.  Reports he was notified that it was time for a device change.  Describes bleeding event after last pacemaker procedure from incision site. Continues to smoke.  Otherwise no acute complaints.   He denies chest pain, palpitations, dyspnea, PND, orthopnea, nausea, vomiting, dizziness, syncope, edema, weight gain, or early satiety.   Review of systems complete and found to be negative unless listed in HPI.    EP Information / Studies Reviewed:    EKG is ordered today. Personal review as below.  EKG Interpretation Date/Time:  Wednesday June 17 2023 11:17:40 EST Ventricular Rate:  60 PR Interval:    QRS Duration:  164 QT Interval:  482 QTC Calculation: 482 R Axis:   -68  Text Interpretation: Ventricular-paced rhythm Atrial fibrillation Confirmed by Canary Brim ([id number]) on 06/17/2023 11:21:30 AM   ICD Interrogation-  reviewed in detail today,  See PACEART report.  Device History: Medtronic BiV ICD implanted 2017, CRT upgrade 07/30/2018 for ICM / sCHF, tachy-brady. Permanent AF s/p AV nodal ablation. Complicated lead history > previous left sided device. Had elevated treshold LV1-LV2 > programmed LV 1 to RV coil with best threshold and best battery life. EKG relatively unchanged and prior attempts V-V optimization was ineffective at narrowing QRS any further or making V1  more positive.  History of appropriate therapy: No History of AAD therapy:  unknown, prior [facility] notes reflect "rate control + chronic anticoagulation"    Studies:  ECHO 09/2021 > 45-50%, no RWMA, LV cavity mildly dilated, LA moderately dilated, RA mildly dilated    Arrhythmia / AAD Permanent AF s/p AV nodal ablation    Risk Assessment/Calculations:    CHA2DS2-VASc Score = 5   This indicates a 7.2% annual risk of stroke. The patient's score is based upon: CHF History: 1 HTN History: 1 Diabetes History: 1 Stroke History: 0 Vascular Disease History: 1 Age Score: 1 Gender Score: 0             Physical Exam:   VS:  BP 104/60   Pulse 60   Ht 5\' 7"  (1.702 m)   Wt 200 lb 3.2 oz (90.8 kg)   SpO2 96%   BMI 31.36 kg/m    Wt Readings from Last 3 Encounters:  06/17/23 200 lb 3.2 oz (90.8 kg)  04/23/23 201 lb 9.6 oz (91.4 kg)  04/14/23 201 lb 9.6 oz (91.4 kg)     GEN: Well nourished, well developed in no acute distress NECK: No JVD; No carotid bruits CARDIAC: Regular rate and rhythm, no murmurs, rubs, gallops RESPIRATORY:  Clear to auscultation without rales, wheezing or rhonchi  ABDOMEN: Soft, non-tender, non-distended EXTREMITIES:  No edema; No deformity   ASSESSMENT AND PLAN:    Chronic Systolic Dysfunction s/p Medtronic CRT-D  -device at RRT as of 06/14/23  -euvolemic today -reviewed NPO status before procedure, no meds  am of procedure, soap scrub, anticoagulation   -stable on an appropriate medical regimen -BiV effective pacing ~ 68% -see Pace Art report -No changes today  Elevated LV Threshold  -chronic, previously 2.5V @ 1ms. Today 3.5V at 1ms  -reviewed with Dr. Ladona Ridgel, given elevated thresholds, prior efforts at V-V optimization, discussed possible LV lead revision (LBAP vs CS placement) at time of generator change  -patient indicates understanding of above, discussed risks / benefits. He notes he had significant bleeding from the incision site after a device  change in the past.   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 Ladona Ridgel  as planned for generator change on Wednesday December 11th     Signed, Canary Brim, MSN, APRN, NP-C, AGACNP-BC [facility] - Electrophysiology  06/17/2023, 11:23 AM

## 2023-06-18 LAB — CBC
Hematocrit: 39 % (ref 37.5–51.0)
Hemoglobin: 12.3 g/dL — ABNORMAL LOW (ref 13.0–17.7)
MCH: 30 pg (ref 26.6–33.0)
MCHC: 31.5 g/dL (ref 31.5–35.7)
MCV: 95 fL (ref 79–97)
Platelets: 278 x10E3/uL (ref 150–450)
RBC: 4.1 x10E6/uL — ABNORMAL LOW (ref 4.14–5.80)
RDW: 11.9 % (ref 11.6–15.4)
WBC: 7.5 x10E3/uL (ref 3.4–10.8)

## 2023-06-18 LAB — BASIC METABOLIC PANEL
BUN/Creatinine Ratio: 9 — ABNORMAL LOW (ref 10–24)
BUN: 7 mg/dL — ABNORMAL LOW (ref 8–27)
CO2: 35 mmol/L — ABNORMAL HIGH (ref 20–29)
Calcium: 9.4 mg/dL (ref 8.6–10.2)
Chloride: 98 mmol/L (ref 96–106)
Creatinine, Ser: 0.74 mg/dL — ABNORMAL LOW (ref 0.76–1.27)
Glucose: 94 mg/dL (ref 70–99)
Potassium: 4.3 mmol/L (ref 3.5–5.2)
Sodium: 142 mmol/L (ref 134–144)
eGFR: 96 mL/min/{1.73_m2} (ref >59)

## 2023-06-29 DIAGNOSIS — E1140 Type 2 diabetes mellitus with diabetic neuropathy, unspecified: Secondary | ICD-10-CM | POA: Diagnosis not present

## 2023-06-29 DIAGNOSIS — I739 Peripheral vascular disease, unspecified: Secondary | ICD-10-CM | POA: Diagnosis not present

## 2023-06-29 DIAGNOSIS — E1169 Type 2 diabetes mellitus with other specified complication: Secondary | ICD-10-CM | POA: Diagnosis not present

## 2023-06-29 DIAGNOSIS — Z125 Encounter for screening for malignant neoplasm of prostate: Secondary | ICD-10-CM | POA: Diagnosis not present

## 2023-06-29 DIAGNOSIS — E669 Obesity, unspecified: Secondary | ICD-10-CM | POA: Diagnosis not present

## 2023-06-29 DIAGNOSIS — E7800 Pure hypercholesterolemia, unspecified: Secondary | ICD-10-CM | POA: Diagnosis not present

## 2023-06-29 DIAGNOSIS — Z7901 Long term (current) use of anticoagulants: Secondary | ICD-10-CM | POA: Diagnosis not present

## 2023-06-29 DIAGNOSIS — Z6832 Body mass index (BMI) 32.0-32.9, adult: Secondary | ICD-10-CM | POA: Diagnosis not present

## 2023-06-29 DIAGNOSIS — Z23 Encounter for immunization: Secondary | ICD-10-CM | POA: Diagnosis not present

## 2023-06-29 DIAGNOSIS — I10 Essential (primary) hypertension: Secondary | ICD-10-CM | POA: Diagnosis not present

## 2023-06-29 DIAGNOSIS — Z1389 Encounter for screening for other disorder: Secondary | ICD-10-CM | POA: Diagnosis not present

## 2023-06-29 DIAGNOSIS — I4891 Unspecified atrial fibrillation: Secondary | ICD-10-CM | POA: Diagnosis not present

## 2023-06-29 DIAGNOSIS — Z0000 Encounter for general adult medical examination without abnormal findings: Secondary | ICD-10-CM | POA: Diagnosis not present

## 2023-06-30 DIAGNOSIS — Z23 Encounter for immunization: Secondary | ICD-10-CM | POA: Diagnosis not present

## 2023-07-02 DIAGNOSIS — K5790 Diverticulosis of intestine, part unspecified, without perforation or abscess without bleeding: Secondary | ICD-10-CM | POA: Diagnosis not present

## 2023-07-02 DIAGNOSIS — Z79899 Other long term (current) drug therapy: Secondary | ICD-10-CM | POA: Diagnosis not present

## 2023-07-02 DIAGNOSIS — Z7901 Long term (current) use of anticoagulants: Secondary | ICD-10-CM | POA: Diagnosis not present

## 2023-07-02 DIAGNOSIS — K5900 Constipation, unspecified: Secondary | ICD-10-CM | POA: Diagnosis not present

## 2023-07-02 DIAGNOSIS — J449 Chronic obstructive pulmonary disease, unspecified: Secondary | ICD-10-CM | POA: Diagnosis not present

## 2023-07-02 DIAGNOSIS — K648 Other hemorrhoids: Secondary | ICD-10-CM | POA: Diagnosis not present

## 2023-07-06 NOTE — Pre-Procedure Instructions (Signed)
Instructed patient on the following items: Arrival time 0830 Nothing to eat or drink after midnight No meds AM of procedure Responsible person to drive you home and stay with you for 24 hrs Wash with special soap night before and morning of procedure If on anti-coagulant drug instructions Coumadin-last dose 12/8

## 2023-07-08 ENCOUNTER — Other Ambulatory Visit: Payer: Self-pay

## 2023-07-08 ENCOUNTER — Ambulatory Visit (HOSPITAL_COMMUNITY)
Admission: RE | Disposition: A | Discharge status: Home / Self Care | Payer: Self-pay | Source: Home / Self Care | Attending: Internal Medicine

## 2023-07-08 ENCOUNTER — Encounter (HOSPITAL_COMMUNITY): Payer: Self-pay | Admitting: Internal Medicine

## 2023-07-08 ENCOUNTER — Inpatient Hospital Stay (HOSPITAL_COMMUNITY)
Admission: RE | Admit: 2023-07-08 | Discharge: 2023-07-10 | DRG: 276 | Disposition: A | Discharge status: Home / Self Care | Payer: Medicare Other | Attending: Internal Medicine | Admitting: Internal Medicine

## 2023-07-08 DIAGNOSIS — Z803 Family history of malignant neoplasm of breast: Secondary | ICD-10-CM | POA: Diagnosis not present

## 2023-07-08 DIAGNOSIS — Z9981 Dependence on supplemental oxygen: Secondary | ICD-10-CM

## 2023-07-08 DIAGNOSIS — I428 Other cardiomyopathies: Secondary | ICD-10-CM | POA: Diagnosis not present

## 2023-07-08 DIAGNOSIS — Z87891 Personal history of nicotine dependence: Secondary | ICD-10-CM

## 2023-07-08 DIAGNOSIS — I5023 Acute on chronic systolic (congestive) heart failure: Secondary | ICD-10-CM | POA: Diagnosis not present

## 2023-07-08 DIAGNOSIS — I495 Sick sinus syndrome: Secondary | ICD-10-CM | POA: Diagnosis present

## 2023-07-08 DIAGNOSIS — R0989 Other specified symptoms and signs involving the circulatory and respiratory systems: Secondary | ICD-10-CM | POA: Diagnosis not present

## 2023-07-08 DIAGNOSIS — J441 Chronic obstructive pulmonary disease with (acute) exacerbation: Secondary | ICD-10-CM | POA: Diagnosis not present

## 2023-07-08 DIAGNOSIS — I110 Hypertensive heart disease with heart failure: Principal | ICD-10-CM | POA: Diagnosis present

## 2023-07-08 DIAGNOSIS — Z7901 Long term (current) use of anticoagulants: Secondary | ICD-10-CM | POA: Diagnosis not present

## 2023-07-08 DIAGNOSIS — M1990 Unspecified osteoarthritis, unspecified site: Secondary | ICD-10-CM | POA: Diagnosis present

## 2023-07-08 DIAGNOSIS — Z79899 Other long term (current) drug therapy: Secondary | ICD-10-CM

## 2023-07-08 DIAGNOSIS — Z811 Family history of alcohol abuse and dependence: Secondary | ICD-10-CM

## 2023-07-08 DIAGNOSIS — E785 Hyperlipidemia, unspecified: Secondary | ICD-10-CM | POA: Diagnosis present

## 2023-07-08 DIAGNOSIS — Z950 Presence of cardiac pacemaker: Secondary | ICD-10-CM | POA: Diagnosis not present

## 2023-07-08 DIAGNOSIS — I4821 Permanent atrial fibrillation: Secondary | ICD-10-CM | POA: Diagnosis not present

## 2023-07-08 DIAGNOSIS — Z4502 Encounter for adjustment and management of automatic implantable cardiac defibrillator: Secondary | ICD-10-CM

## 2023-07-08 DIAGNOSIS — I5022 Chronic systolic (congestive) heart failure: Secondary | ICD-10-CM | POA: Diagnosis present

## 2023-07-08 DIAGNOSIS — J9621 Acute and chronic respiratory failure with hypoxia: Secondary | ICD-10-CM | POA: Diagnosis present

## 2023-07-08 DIAGNOSIS — I442 Atrioventricular block, complete: Secondary | ICD-10-CM | POA: Diagnosis not present

## 2023-07-08 DIAGNOSIS — Z7984 Long term (current) use of oral hypoglycemic drugs: Secondary | ICD-10-CM

## 2023-07-08 DIAGNOSIS — Z45010 Encounter for checking and testing of cardiac pacemaker pulse generator [battery]: Secondary | ICD-10-CM

## 2023-07-08 DIAGNOSIS — Z9049 Acquired absence of other specified parts of digestive tract: Secondary | ICD-10-CM

## 2023-07-08 DIAGNOSIS — E1151 Type 2 diabetes mellitus with diabetic peripheral angiopathy without gangrene: Secondary | ICD-10-CM | POA: Diagnosis present

## 2023-07-08 DIAGNOSIS — I255 Ischemic cardiomyopathy: Secondary | ICD-10-CM | POA: Diagnosis present

## 2023-07-08 DIAGNOSIS — J9622 Acute and chronic respiratory failure with hypercapnia: Secondary | ICD-10-CM | POA: Diagnosis present

## 2023-07-08 DIAGNOSIS — Z91041 Radiographic dye allergy status: Secondary | ICD-10-CM | POA: Diagnosis not present

## 2023-07-08 HISTORY — PX: BIV ICD GENERATOR CHANGEOUT: EP1194

## 2023-07-08 HISTORY — DX: Acute on chronic systolic (congestive) heart failure: I50.23

## 2023-07-08 HISTORY — PX: LEAD REVISION/REPAIR: EP1213

## 2023-07-08 LAB — PROTIME-INR
INR: 1.1 (ref 0.8–1.2)
Prothrombin Time: 14.6 s (ref 11.4–15.2)

## 2023-07-08 LAB — GLUCOSE, CAPILLARY: Glucose-Capillary: 136 mg/dL — ABNORMAL HIGH (ref 70–99)

## 2023-07-08 SURGERY — BIV ICD GENERATOR CHANGEOUT

## 2023-07-08 MED ORDER — SPIRONOLACTONE 25 MG PO TABS
25.0000 mg | ORAL_TABLET | Freq: Every day | ORAL | Status: DC
  Administered 2023-07-08 – 2023-07-10 (×3): 25 mg via ORAL
  Filled 2023-07-08 (×3): qty 1

## 2023-07-08 MED ORDER — FUROSEMIDE 40 MG PO TABS
80.0000 mg | ORAL_TABLET | Freq: Every day | ORAL | Status: DC
  Administered 2023-07-09 – 2023-07-10 (×2): 80 mg via ORAL
  Filled 2023-07-08 (×4): qty 2

## 2023-07-08 MED ORDER — SODIUM CHLORIDE 0.9 % IV SOLN
INTRAVENOUS | Status: AC
  Administered 2023-07-08: 80 mg
  Filled 2023-07-08: qty 2

## 2023-07-08 MED ORDER — ACETAMINOPHEN 325 MG PO TABS: 325.0000 mg | ORAL_TABLET | ORAL | Status: DC | PRN

## 2023-07-08 MED ORDER — METOPROLOL SUCCINATE ER 25 MG PO TB24
25.0000 mg | ORAL_TABLET | Freq: Every day | ORAL | Status: DC
  Administered 2023-07-08 – 2023-07-09 (×2): 25 mg via ORAL
  Filled 2023-07-08 (×2): qty 1

## 2023-07-08 MED ORDER — IOHEXOL 350 MG/ML SOLN
INTRAVENOUS | Status: DC | PRN
  Administered 2023-07-08: 15 mL via INTRAVENOUS

## 2023-07-08 MED ORDER — POVIDONE-IODINE 10 % EX SWAB
2.0000 | Freq: Once | CUTANEOUS | Status: AC
  Administered 2023-07-08: 2 via TOPICAL

## 2023-07-08 MED ORDER — CHLORHEXIDINE GLUCONATE 4 % EX SOLN: 4.0000 | Freq: Once | CUTANEOUS | Status: DC

## 2023-07-08 MED ORDER — CEFAZOLIN SODIUM-DEXTROSE 2-4 GM/100ML-% IV SOLN
INTRAVENOUS | Status: AC
  Administered 2023-07-08: 2 g via INTRAVENOUS
  Filled 2023-07-08: qty 100

## 2023-07-08 MED ORDER — ONDANSETRON HCL 4 MG/2ML IJ SOLN
4.0000 mg | Freq: Four times a day (QID) | INTRAMUSCULAR | Status: DC | PRN
Start: 2023-07-08 — End: 2023-07-10

## 2023-07-08 MED ORDER — METHYLPREDNISOLONE SODIUM SUCC 125 MG IJ SOLR
INTRAMUSCULAR | Status: DC | PRN
  Administered 2023-07-08: 62.5 mg via INTRAVENOUS

## 2023-07-08 MED ORDER — SODIUM CHLORIDE 0.9 % IV SOLN: 80.0000 mg | INTRAVENOUS | Status: AC

## 2023-07-08 MED ORDER — SODIUM CHLORIDE 0.9 % IV SOLN
INTRAVENOUS | Status: DC | PRN
  Administered 2023-07-08: 80 mg

## 2023-07-08 MED ORDER — LIDOCAINE HCL (PF) 1 % IJ SOLN
INTRAMUSCULAR | Status: AC
  Filled 2023-07-08: qty 60

## 2023-07-08 MED ORDER — CEFAZOLIN SODIUM-DEXTROSE 2-4 GM/100ML-% IV SOLN: 2.0000 g | INTRAVENOUS | Status: AC

## 2023-07-08 MED ORDER — DIPHENHYDRAMINE HCL 50 MG/ML IJ SOLN
INTRAMUSCULAR | Status: DC | PRN
  Administered 2023-07-08: 25 mg via INTRAVENOUS

## 2023-07-08 MED ORDER — FENTANYL CITRATE (PF) 100 MCG/2ML IJ SOLN
INTRAMUSCULAR | Status: AC
  Filled 2023-07-08: qty 2

## 2023-07-08 MED ORDER — SODIUM CHLORIDE 0.9 % IV SOLN
INTRAVENOUS | Status: AC
  Filled 2023-07-08: qty 2

## 2023-07-08 MED ORDER — SODIUM CHLORIDE 0.9 % IV SOLN
INTRAVENOUS | Status: DC
Start: 2023-07-08 — End: 2023-07-08

## 2023-07-08 MED ORDER — CEFAZOLIN SODIUM-DEXTROSE 1-4 GM/50ML-% IV SOLN
1.0000 g | Freq: Four times a day (QID) | INTRAVENOUS | Status: AC
  Administered 2023-07-08 – 2023-07-09 (×3): 1 g via INTRAVENOUS
  Filled 2023-07-08 (×3): qty 50

## 2023-07-08 MED ORDER — MIDAZOLAM HCL 2 MG/2ML IJ SOLN
INTRAMUSCULAR | Status: AC
  Filled 2023-07-08: qty 2

## 2023-07-08 MED ORDER — ENALAPRIL MALEATE 2.5 MG PO TABS
2.5000 mg | ORAL_TABLET | Freq: Every day | ORAL | Status: DC
  Administered 2023-07-09: 2.5 mg via ORAL
  Filled 2023-07-08 (×2): qty 1

## 2023-07-08 MED ORDER — LIDOCAINE HCL (PF) 1 % IJ SOLN
INTRAMUSCULAR | Status: DC | PRN
  Administered 2023-07-08: 5 mL
  Administered 2023-07-08: 50 mL

## 2023-07-08 MED ORDER — HEPARIN (PORCINE) IN NACL 1000-0.9 UT/500ML-% IV SOLN
INTRAVENOUS | Status: DC | PRN
  Administered 2023-07-08: 500 mL

## 2023-07-08 MED ORDER — FUROSEMIDE 40 MG PO TABS
80.0000 mg | ORAL_TABLET | Freq: Every day | ORAL | Status: DC
  Administered 2023-07-08: 80 mg via ORAL
  Filled 2023-07-08: qty 2

## 2023-07-08 MED ORDER — MIDAZOLAM HCL 5 MG/5ML IJ SOLN
INTRAMUSCULAR | Status: DC | PRN
  Administered 2023-07-08: 1 mg via INTRAVENOUS

## 2023-07-08 MED ORDER — FUROSEMIDE 10 MG/ML IJ SOLN
80.0000 mg | Freq: Once | INTRAMUSCULAR | Status: AC
  Administered 2023-07-08: 80 mg via INTRAVENOUS
  Filled 2023-07-08: qty 8

## 2023-07-08 MED ORDER — FENTANYL CITRATE (PF) 100 MCG/2ML IJ SOLN
INTRAMUSCULAR | Status: DC | PRN
  Administered 2023-07-08: 12.5 ug via INTRAVENOUS

## 2023-07-08 MED ORDER — ALBUTEROL SULFATE (2.5 MG/3ML) 0.083% IN NEBU
2.5000 mg | INHALATION_SOLUTION | RESPIRATORY_TRACT | Status: DC | PRN
  Administered 2023-07-08 – 2023-07-09 (×2): 2.5 mg via RESPIRATORY_TRACT
  Filled 2023-07-08 (×2): qty 3

## 2023-07-08 SURGICAL SUPPLY — 16 items
CABLE SURGICAL S-101-97-12 (CABLE) ×1
CATH ATTAIN COM SURV 6250V-MB2 (CATHETERS) ×1
CATH JOSEPH QUAD ALLRED 6F REP (CATHETERS) ×1
ICD COBALT XT QUAD CRT DTPA2Q1 (ICD Generator) ×1 IMPLANT
KIT LEAD END CAP (Cap) ×1 IMPLANT
KIT MICROPUNCTURE NIT STIFF (SHEATH) ×1
KIT WRENCH (KITS) ×1
LEAD ATTAIN PERFORM ST 4398-88 (Lead) ×1 IMPLANT
PAD DEFIB RADIO PHYSIO CONN (PAD) ×1
PIN PLUG IS-1 DEFIB (PIN) ×1
POUCH AIGIS-R ANTIBACT ICD (Mesh General) ×1 IMPLANT
SHEATH 7FR PRELUDE SNAP 13 (SHEATH)
SHEATH 9FR PRELUDE SNAP 13 (SHEATH) ×1
TRAY PACEMAKER INSERTION (PACKS) ×1
WIRE ACUITY WHISPER EDS 4648 (WIRE) ×1
WIRE MICRO SET SILHO 5FR 7 (SHEATH) ×1

## 2023-07-08 NOTE — Progress Notes (Signed)
Bladder scan showed 367 mL urine. Informed patient that we might need to do an in and out cath per protocol. Patient is refusing to have a in and out scan and says "Absolutely not. I'm gonna ran away out of this hospital right now if we'll do that." Encouraged patient to urinate. Assisted him to sit and dangle on side of the bed. Patient able to urinate 250 mL urine. Will continue to monitor.

## 2023-07-08 NOTE — Progress Notes (Signed)
LATE ENTRY NOTE: Called by holding RN that despite PO lasix given patient had no urine OP and remained SOB. In the process of transporting to 6E Met the patient on the floor Seen bedside at 17:25 He reported at baseline he is on 4L O2 at home with saturations anywhere from 87-95% He reports at home taking 80mg  lasix in the AM and 40mg  PM with an additional PRN 40mg  He reports daily inhaler as well as rescue inhaler that he uses periodically  No CP He does not appear in distress, winded transferring from stretcher to bed Cough he reports is chronic and productive Exam is RRR, he has crackles b/l bases and mid-end exp wheezes b/l Bladder scan with approx He denies urination since home this morning Advised in/out catheter though the pt refused, advised that if he is unable to urinate will need to proceed post IV lasix Ordered  Lasix 80mg  x1 Albuterol neb/treatment  Francis Dowse, PA-C

## 2023-07-08 NOTE — Progress Notes (Signed)
Patient arrived to room [phone]. VS stable, patient oriented to room.

## 2023-07-08 NOTE — Progress Notes (Signed)
   Received sign out form day team and was asked to follow up on patient following dose of IV Lasix. There is no documented urine output, but patient tells me that he has urinated about 500cc. He feels like he is breathing a little better and looks comfortable. He does have a wet sounding cough. Lung sounds mildly decreased with very faint expiratory wheeze but crackles have resolved. Spoke with RN and asked her to document I/Os over night. Also asked that she repeat bladder scan to make sure he is not having any significant urinary retention.  Corrin Parker, PA-C 07/08/2023 8:07 PM

## 2023-07-08 NOTE — H&P (Signed)
HPI He is a 72 yo man with persistent atrial fib and tachybrady s/p PPM insertion who developed worsening CHF symptoms associated with uncontrolled atrial fib and underwent AV node ablation. He has stopped smoking but since last being seen in our clinic, gained over 20 lbs.Today, he denies symptoms of palpitations, chest pain, shortness of breath,  lower extremity edema, dizziness, presyncope, or syncope.  He admits to being sedentary .      Allergies       Allergies  Allergen Reactions   Ivp Dye [Iodinated Contrast Media] Other (See Comments)      Constipation x 7 days                Current Outpatient Medications  Medication Sig Dispense Refill   Cyanocobalamin (B-12) 1000 MCG TABS Take 1,000 mcg by mouth daily.       diphenhydrAMINE (BENADRYL) 25 mg capsule Take 25 mg by mouth 2 (two) times daily.       enalapril (VASOTEC) 2.5 MG tablet Take 2.5 mg by mouth daily.       eplerenone (INSPRA) 25 MG tablet Take 1 tablet (25 mg total) by mouth daily. 90 tablet 3   ferrous sulfate 324 MG TBEC Take 324 mg by mouth daily.       furosemide (LASIX) 80 MG tablet Take 80 mg by mouth as directed. 2 tablets in the morning and 1 tablets in the evening       metFORMIN (GLUCOPHAGE-XR) 500 MG 24 hr tablet Take 1,000 mg by mouth 2 (two) times daily.        metoprolol succinate (TOPROL-XL) 25 MG 24 hr tablet TAKE 1 TABLET BY MOUTH ONCE DAILY WITH FOOD 90 tablet 3   Omega-3 Fatty Acids (OMEGA 3 PO) Take 1 tablet by mouth 2 (two) times daily.       OXYGEN Inhale 4 L into the lungs as needed.       potassium chloride SA (K-DUR,KLOR-CON) 20 MEQ tablet Take 20 mEq by mouth every evening.        rosuvastatin (CRESTOR) 20 MG tablet Take 20 mg by mouth daily.       tiotropium (SPIRIVA) 18 MCG inhalation capsule Place 18 mcg into inhaler and inhale daily.       TURMERIC EXTRA STRENGTH PO Take 1 tablet by mouth daily.       warfarin (COUMADIN) 1 MG tablet Take 1 mg by mouth every other day.        warfarin (COUMADIN) 5 MG tablet Take 5 mg by mouth daily at 8 pm.          No current facility-administered medications for this visit.              Past Medical History:  Diagnosis Date   Alcohol abuse     Arthritis     Atrial fibrillation (HCC) 08/02/2018   CHF (congestive heart failure) (HCC) 03/12/2017   Chronic atrial fibrillation (HCC) 03/12/2017   Chronic respiratory failure (HCC)     COPD (chronic obstructive pulmonary disease) (HCC) 03/12/2017   Dyspnea      wears Oxygen 24/7 3 L   Essential hypertension 03/12/2017   High risk medication use 05/05/2017   Hyperlipidemia 03/12/2017   Hypertensive heart disease with heart failure (HCC) 03/12/2017   ICD (implantable cardioverter-defibrillator) in place 07/30/2018   Ischemic cardiomyopathy 03/12/2017   Long term (current) use of anticoagulants 03/12/2017   Peripheral vascular disease (HCC) 03/12/2017  Preoperative cardiovascular examination 03/25/2015   Presence of combination internal cardiac defibrillator (ICD) and pacemaker 03/12/2017    unable to place a CRT    Type 2 diabetes mellitus without complications (HCC) 03/12/2017          ROS:    All systems reviewed and negative except as noted in the HPI.          Past Surgical History:  Procedure Laterality Date   AV NODE ABLATION N/A 08/02/2018    Procedure: AV NODE ABLATION;  Surgeon: Marinus Maw, MD;  Location: [facility] INVASIVE CV LAB;  Service: Cardiovascular;  Laterality: N/A;   BIV ICD INSERTION CRT-D N/A 07/30/2018    Procedure: BIV ICD INSERTION CRT-D;  Surgeon: Marinus Maw, MD;  Location: [facility] OR;  Service: Cardiovascular;  Laterality: N/A;   BIV PACEMAKER GENERATOR CHANGEOUT N/A 05/27/2018    Procedure: BIV PACEMAKER GENERATOR UPGRADE - Aborted;  Surgeon: Regan Lemming, MD;  Location: [facility] INVASIVE CV LAB;  Service: Cardiovascular;  Laterality: N/A;   CHOLECYSTECTOMY       COLONOSCOPY       ERCP        stone removal   EXCISION OF BREAST BIOPSY        Age 20,  benign   PACEMAKER LEAD REMOVAL N/A 07/30/2018    Procedure: PACEMAKER LEAD REMOVAL;  Surgeon: Marinus Maw, MD;  Location: [facility] OR;  Service: Cardiovascular;  Laterality: N/A;                 Family History  Problem Relation Age of Onset   Breast cancer Sister     Alcohol abuse Brother     Alcohol abuse Brother              Social History         Socioeconomic History   Marital status: Divorced      Spouse name: Not on file   Number of children: Not on file   Years of education: Not on file   Highest education level: Not on file  Occupational History   Not on file  Tobacco Use   Smoking status: Former      Current packs/day: 0.00      Average packs/day: 1 pack/day for 61.0 years (61.0 ttl pk-yrs)      Types: Cigarettes      Start date: 08/1957      Quit date: 08/2018      Years since quitting: 4.6   Smokeless tobacco: Never  Vaping Use   Vaping status: Never Used  Substance and Sexual Activity   Alcohol use: No   Drug use: No   Sexual activity: Not on file  Other Topics Concern   Not on file  Social History Narrative   Not on file    Social Determinants of Health    Financial Resource Strain: Not on file  Food Insecurity: Not on file  Transportation Needs: Not on file  Physical Activity: Not on file  Stress: Not on file  Social Connections: Not on file  Intimate Partner Violence: Not on file        BP 110/62   Pulse 82   Ht 5\' 7"  (1.702 m)   Wt 201 lb 9.6 oz (91.4 kg)   SpO2 95%   BMI 31.58 kg/m    Physical Exam:   Chronically ill appearing NAD HEENT: Unremarkable Neck:  No JVD, no thyromegally Lymphatics:  No adenopathy Back:  No CVA tenderness Lungs:  Clear with occasional rales. HEART:  Regular rate rhythm, no murmurs, no rubs, no clicks Abd:  soft, positive bowel sounds, no organomegally, no rebound, no guarding Ext:  2 plus pulses, no edema, no cyanosis, no clubbing Skin:  No rashes no nodules Neuro:  CN II through XII intact,  motor grossly intact   EKG - afib with ventricular pacing   DEVICE  Normal device function.  See PaceArt for details.    Assess/Plan:    Atrial fib - his VR is well controlled. He will continue his current meds. 2. Coags - he will continue warfarin 3. HTN -his bp is well controlled. 4. Dyslipidemia - he will continue simvastatin. 5. ICD - his biv is working normally. He will need to proceed with ICD gen change out as he is approaching ERI.    Dorathy Daft  EP Attending  Patient seen and examined. I discussed the findings of his most recent clinic visit. His threshold is increased. If we cannot find a satisfactory vector for pacing, then we will attempt placement of a new CS or left bundle area pacing lead. No extraction is planned. I have reviewed all of the above with the patient and he wishes to proceed.  Sharlot Gowda Aneisa Karren,MD

## 2023-07-09 ENCOUNTER — Ambulatory Visit (HOSPITAL_COMMUNITY): Payer: Medicare Other

## 2023-07-09 ENCOUNTER — Other Ambulatory Visit (HOSPITAL_COMMUNITY): Payer: Self-pay

## 2023-07-09 ENCOUNTER — Encounter (HOSPITAL_COMMUNITY): Payer: Self-pay | Admitting: Internal Medicine

## 2023-07-09 DIAGNOSIS — J9621 Acute and chronic respiratory failure with hypoxia: Secondary | ICD-10-CM | POA: Diagnosis present

## 2023-07-09 DIAGNOSIS — I5023 Acute on chronic systolic (congestive) heart failure: Secondary | ICD-10-CM | POA: Diagnosis present

## 2023-07-09 DIAGNOSIS — Z87891 Personal history of nicotine dependence: Secondary | ICD-10-CM | POA: Diagnosis not present

## 2023-07-09 DIAGNOSIS — M1990 Unspecified osteoarthritis, unspecified site: Secondary | ICD-10-CM | POA: Diagnosis present

## 2023-07-09 DIAGNOSIS — E1151 Type 2 diabetes mellitus with diabetic peripheral angiopathy without gangrene: Secondary | ICD-10-CM | POA: Diagnosis present

## 2023-07-09 DIAGNOSIS — Z91041 Radiographic dye allergy status: Secondary | ICD-10-CM | POA: Diagnosis not present

## 2023-07-09 DIAGNOSIS — I4821 Permanent atrial fibrillation: Secondary | ICD-10-CM | POA: Diagnosis present

## 2023-07-09 DIAGNOSIS — I255 Ischemic cardiomyopathy: Secondary | ICD-10-CM | POA: Diagnosis present

## 2023-07-09 DIAGNOSIS — J441 Chronic obstructive pulmonary disease with (acute) exacerbation: Secondary | ICD-10-CM

## 2023-07-09 DIAGNOSIS — Z950 Presence of cardiac pacemaker: Secondary | ICD-10-CM | POA: Diagnosis not present

## 2023-07-09 DIAGNOSIS — J9622 Acute and chronic respiratory failure with hypercapnia: Secondary | ICD-10-CM

## 2023-07-09 DIAGNOSIS — Z9049 Acquired absence of other specified parts of digestive tract: Secondary | ICD-10-CM | POA: Diagnosis not present

## 2023-07-09 DIAGNOSIS — Z79899 Other long term (current) drug therapy: Secondary | ICD-10-CM | POA: Diagnosis not present

## 2023-07-09 DIAGNOSIS — I428 Other cardiomyopathies: Secondary | ICD-10-CM | POA: Diagnosis present

## 2023-07-09 DIAGNOSIS — Z9981 Dependence on supplemental oxygen: Secondary | ICD-10-CM | POA: Diagnosis not present

## 2023-07-09 DIAGNOSIS — Z7984 Long term (current) use of oral hypoglycemic drugs: Secondary | ICD-10-CM | POA: Diagnosis not present

## 2023-07-09 DIAGNOSIS — I5022 Chronic systolic (congestive) heart failure: Secondary | ICD-10-CM | POA: Diagnosis present

## 2023-07-09 DIAGNOSIS — R0989 Other specified symptoms and signs involving the circulatory and respiratory systems: Secondary | ICD-10-CM | POA: Diagnosis not present

## 2023-07-09 DIAGNOSIS — E785 Hyperlipidemia, unspecified: Secondary | ICD-10-CM | POA: Diagnosis present

## 2023-07-09 DIAGNOSIS — Z45010 Encounter for checking and testing of cardiac pacemaker pulse generator [battery]: Secondary | ICD-10-CM | POA: Diagnosis not present

## 2023-07-09 DIAGNOSIS — Z7901 Long term (current) use of anticoagulants: Secondary | ICD-10-CM | POA: Diagnosis not present

## 2023-07-09 DIAGNOSIS — I110 Hypertensive heart disease with heart failure: Secondary | ICD-10-CM | POA: Diagnosis present

## 2023-07-09 DIAGNOSIS — Z803 Family history of malignant neoplasm of breast: Secondary | ICD-10-CM | POA: Diagnosis not present

## 2023-07-09 DIAGNOSIS — I442 Atrioventricular block, complete: Secondary | ICD-10-CM | POA: Diagnosis present

## 2023-07-09 DIAGNOSIS — Z811 Family history of alcohol abuse and dependence: Secondary | ICD-10-CM | POA: Diagnosis not present

## 2023-07-09 DIAGNOSIS — I495 Sick sinus syndrome: Secondary | ICD-10-CM | POA: Diagnosis present

## 2023-07-09 HISTORY — DX: Acute on chronic systolic (congestive) heart failure: I50.23

## 2023-07-09 LAB — BASIC METABOLIC PANEL
Anion gap: 6 (ref 5–15)
BUN: 11 mg/dL (ref 8–23)
CO2: 36 mmol/L — ABNORMAL HIGH (ref 22–32)
Calcium: 9 mg/dL (ref 8.9–10.3)
Chloride: 98 mmol/L (ref 98–111)
Creatinine, Ser: 0.96 mg/dL (ref 0.61–1.24)
GFR, Estimated: >60 mL/min (ref >60)
Glucose, Bld: 205 mg/dL — ABNORMAL HIGH (ref 70–99)
Potassium: 4.1 mmol/L (ref 3.5–5.1)
Sodium: 140 mmol/L (ref 135–145)

## 2023-07-09 LAB — GLUCOSE, CAPILLARY
Glucose-Capillary: 111 mg/dL — ABNORMAL HIGH (ref 70–99)
Glucose-Capillary: 203 mg/dL — ABNORMAL HIGH (ref 70–99)
Glucose-Capillary: 154 mg/dL — ABNORMAL HIGH (ref 70–99)

## 2023-07-09 LAB — CBC
HCT: 34.6 % — ABNORMAL LOW (ref 39.0–52.0)
Hemoglobin: 11.2 g/dL — ABNORMAL LOW (ref 13.0–17.0)
MCH: 30.5 pg (ref 26.0–34.0)
MCHC: 32.4 g/dL (ref 30.0–36.0)
MCV: 94.3 fL (ref 80.0–100.0)
Platelets: 252 10*3/uL (ref 150–400)
RBC: 3.67 MIL/uL — ABNORMAL LOW (ref 4.22–5.81)
RDW: 12.4 % (ref 11.5–15.5)
WBC: 9.9 10*3/uL (ref 4.0–10.5)
nRBC: 0 % (ref 0.0–0.2)

## 2023-07-09 MED ORDER — UMECLIDINIUM BROMIDE 62.5 MCG/ACT IN AEPB
1.0000 | INHALATION_SPRAY | Freq: Every day | RESPIRATORY_TRACT | Status: DC
  Filled 2023-07-09: qty 7

## 2023-07-09 MED ORDER — IPRATROPIUM-ALBUTEROL 0.5-2.5 (3) MG/3ML IN SOLN
3.0000 mL | RESPIRATORY_TRACT | Status: DC | PRN
  Administered 2023-07-09 – 2023-07-10 (×2): 3 mL via RESPIRATORY_TRACT
  Filled 2023-07-09 (×2): qty 3

## 2023-07-09 MED ORDER — WARFARIN - PHYSICIAN DOSING INPATIENT: Freq: Every day | Status: DC

## 2023-07-09 MED ORDER — FLUTICASONE FUROATE-VILANTEROL 100-25 MCG/ACT IN AEPB
1.0000 | INHALATION_SPRAY | Freq: Every day | RESPIRATORY_TRACT | Status: DC
  Filled 2023-07-09: qty 28

## 2023-07-09 MED ORDER — PREDNISONE 10 MG PO TABS
10.0000 mg | ORAL_TABLET | Freq: Every day | ORAL | Status: DC
  Administered 2023-07-10: 10 mg via ORAL
  Filled 2023-07-09: qty 1

## 2023-07-09 MED ORDER — ORAL CARE MOUTH RINSE: 15.0000 mL | OROMUCOSAL | Status: DC | PRN

## 2023-07-09 MED ORDER — IPRATROPIUM-ALBUTEROL 0.5-2.5 (3) MG/3ML IN SOLN
3.0000 mL | Freq: Once | RESPIRATORY_TRACT | Status: AC
Start: 2023-07-09 — End: 2023-07-09
  Administered 2023-07-09: 3 mL via RESPIRATORY_TRACT
  Filled 2023-07-09: qty 3

## 2023-07-09 MED ORDER — AZITHROMYCIN 250 MG PO TABS
250.0000 mg | ORAL_TABLET | Freq: Every day | ORAL | Status: DC
  Administered 2023-07-09 – 2023-07-10 (×2): 250 mg via ORAL
  Filled 2023-07-09 (×2): qty 1

## 2023-07-09 MED ORDER — FUROSEMIDE 10 MG/ML IJ SOLN
40.0000 mg | Freq: Once | INTRAMUSCULAR | Status: AC
  Administered 2023-07-09: 40 mg via INTRAVENOUS
  Filled 2023-07-09: qty 4

## 2023-07-09 MED ORDER — WARFARIN SODIUM 5 MG PO TABS
6.0000 mg | ORAL_TABLET | Freq: Once | ORAL | Status: AC
  Administered 2023-07-09: 6 mg via ORAL
  Filled 2023-07-09: qty 1

## 2023-07-09 MED ORDER — INSULIN ASPART 100 UNIT/ML IJ SOLN: 0.0000 [IU] | Freq: Three times a day (TID) | INTRAMUSCULAR | Status: DC

## 2023-07-09 MED FILL — Midazolam HCl Inj 2 MG/2ML (Base Equivalent): INTRAMUSCULAR | Qty: 1 | Status: AC

## 2023-07-09 NOTE — Progress Notes (Addendum)
Rounding Note    Patient Name: Jaime Scott Date of Encounter: 07/09/2023  [facility] Cardiologist: Dr. Dulce Sellar EP: Dr. Elberta Fortis >> Dr. Ladona Ridgel   Subjective   Post nebulizer this AM he is feeling towards his baseline, reports minimal if any resting SOB (currently using accessory muscles) reports his current breathing pattern is at/near his normal, no CP  Inpatient Medications    Scheduled Meds:  azithromycin  250 mg Oral Daily   enalapril  2.5 mg Oral Daily   fluticasone furoate-vilanterol  1 puff Inhalation Daily   furosemide  80 mg Oral Q breakfast   ipratropium-albuterol  3 mL Nebulization Once   metoprolol succinate  25 mg Oral Q supper   [START ON 07/10/2023] predniSONE  10 mg Oral Q breakfast   spironolactone  25 mg Oral Daily   umeclidinium bromide  1 puff Inhalation Daily   warfarin  6 mg Oral ONCE-1600   Warfarin - Physician Dosing Inpatient   Does not apply q1600   Continuous Infusions:  PRN Meds: acetaminophen, albuterol, ondansetron (ZOFRAN) IV, mouth rinse   Vital Signs    Vitals:   07/08/23 1719 07/08/23 2000 07/08/23 2018 07/09/23 0500  BP: 121/79  111/69 102/68  Pulse: (!) 58 70    Resp: (!) 23 (!) 22  20  Temp: 98.1 F (36.7 C)  97.9 F (36.6 C) 97.6 F (36.4 C)  TempSrc: Oral  Oral Oral  SpO2: 91% 92%    Weight:      Height:        Intake/Output Summary (Last 24 hours) at 07/09/2023 1136 Last data filed at 07/09/2023 0900 Gross per 24 hour  Intake 1020 ml  Output 1270 ml  Net -250 ml      07/08/2023    8:08 AM 06/17/2023   11:09 AM 04/23/2023   10:29 AM  Last 3 Weights  Weight (lbs) 204 lb 200 lb 3.2 oz 201 lb 9.6 oz  Weight (kg) 92.534 kg 90.81 kg 91.445 kg      Telemetry    V paced - Personally Reviewed  ECG    AFib/V paced 67bpm, QT manually measured 460ms/QTc 507 >> QRS 144 >>> QTc accounting for RS duration - Personally Reviewed  Physical Exam   GEN: No acute distress.   Neck: No JVD Cardiac:  RRR, no murmurs, rubs, or gallops.  Respiratory: mot much air movement, with some end exp wheezes GI: Soft, nontender, non-distended  MS: No edema; No deformity. Neuro:  Nonfocal  Psych: Normal affect   Labs    High Sensitivity Troponin:  No results for input(s): "TROPONINIHS" in the last 720 hours.   Chemistry Recent Labs  Lab 07/09/23 0314  NA 140  K 4.1  CL 98  CO2 36*  GLUCOSE 205*  BUN 11  CREATININE 0.96  CALCIUM 9.0  GFRNONAA >60  ANIONGAP 6    Lipids No results for input(s): "CHOL", "TRIG", "HDL", "LABVLDL", "LDLCALC", "CHOLHDL" in the last 168 hours.  HematologyNo results for input(s): "WBC", "RBC", "HGB", "HCT", "MCV", "MCH", "MCHC", "RDW", "PLT" in the last 168 hours. Thyroid No results for input(s): "TSH", "FREET4" in the last 168 hours.  BNPNo results for input(s): "BNP", "PROBNP" in the last 168 hours.  DDimer No results for input(s): "DDIMER" in the last 168 hours.   Radiology    DG Chest 2 View Result Date: 07/09/2023 CLINICAL DATA:  ICD EXAM: CHEST - 2 VIEW COMPARISON:  X-ray 10/07/2021. FINDINGS: Hyperinflation. Enlarged cardiopericardial silhouette. Vascular  congestion. No pneumothorax or effusion. No consolidation. Right upper chest battery pack with leads overlying the right side of the heart. Leads has a different configuration than previous. Please correlate with history. Films are under penetrated. Overlapping cardiac leads. IMPRESSION: Enlarged cardiopericardial silhouette with vascular congestion. Right upper chest pacemaker/defibrillator. Electronically Signed   By: Karen Kays M.D.   On: 07/09/2023 10:22    Cardiac Studies    10/07/21: TTE  1. GLS -9.9. Left ventricular ejection fraction, by estimation, is 45 to  50%. The left ventricle has mildly decreased function. The left ventricle  has no regional wall motion abnormalities. The left ventricular internal  cavity size was mildly dilated.  Left ventricular diastolic parameters are  indeterminate.   2. Right ventricular systolic function is normal. The right ventricular  size is normal. There is normal pulmonary artery systolic pressure.   3. Left atrial size was moderately dilated.   4. Right atrial size was mildly dilated.   5. The mitral valve is normal in structure. Mild mitral valve  regurgitation. No evidence of mitral stenosis.   6. The aortic valve is normal in structure. Aortic valve regurgitation is  mild. No aortic stenosis is present.   7. There is moderate dilatation of the ascending aorta, measuring 41 mm.   8. The inferior vena cava is normal in size with greater than 50%  respiratory variability, suggesting right atrial pressure of 3 mmHg.   Patient Profile     72 y.o. male w/PMHx of NICM, chronic CHF (systolic), LBBB, DM, HTN, HLD, permanent AFib, and COPD/chronic respiratory failure (on 4l at home) came out patient for ICD LV lead revision.  08/02/2018: underwent ICD system extraction from the R side (2/2 occluded subclavian vein and prior failed attempts at CS lead) >> underwent new R sided implant >>> since then developed high LV thresholds >>> Lead noted to have fallen/retracted back into the RV, was ERI >> underwent gen change, new LV lead placed via R internal jugular approach and the original LV lead abandoned  Assessment & Plan    ICD gen change/lead revision Procedure as described above and procedure report Site is stable No bleeding or hematoma  2. COPD 3. Chronic respiratory failure Responded well to IV lasix and nebulizer last night SOB again this AM Nebulizer with some improvement though still using accessory muscles to beath Pt reports this near his baseline Pulmonary team consulted and much appreciated Felt to be a COPD exacerbation Inhalers/neb adjusted Started on prednisone and azithromycin. (Not on an AADs, QTc looks OK) Recommended he have formal pulmonary out patient though patient seemed reluctant  4. Permanent  Afib INR yesterday 1.2 having held home regime 2 days priro 6mg  tonight to start back up on his home regime INR in the AM  5. NICM Acute/on chronic CHF Urinating Follow I/O (suspect to be inaccurate low), cumulatively neg by charting only -250 IV lasix again given this AM as well as his PO dose BMET in the AM  6. DM SSI ordered    For questions or updates, please contact [facility] Please consult [email or web address] for contact info under        Signed, Sheilah Pigeon, PA-C  07/09/2023, 11:36 AM    I have seen and examined this patient with Francis Dowse.  Agree with above, note added to reflect my findings.  Generator change with LV lead addition yesterday.  Patient has been short of breath.  IV Lasix with improvement in volume status.  Feeling improved.  GEN: Well nourished, well developed, in no acute distress  HEENT: normal  Neck: no JVD, carotid bruits, or masses Cardiac: RRR; no murmurs, rubs, or gallops,no edema  Respiratory:  clear to auscultation bilaterally, normal work of breathing GI: soft, nontender, nondistended, + BS MS: no deformity or atrophy  Skin: warm and dry, device site well healed Neuro:  Strength and sensation are intact Psych: euthymic mood, full affect   Chronic systolic heart failure: Due to nonischemic cardiomyopathy.  Post generator change with CS lead addition.  Device functioning appropriately. Permanent atrial fibrillation: Restart Coumadin today COPD with chronic respiratory failure: If consulted pulmonary medicine.  Appreciate their input  Dasia Guerrier M. Shakyia Bosso MD 07/09/2023 2:53 PM

## 2023-07-09 NOTE — Consult Note (Signed)
NAME:  Jaime Scott, MRN:  [id number], DOB:  [birth year], LOS: 0 ADMISSION DATE:  07/08/2023, CONSULTATION DATE:  12/12 REFERRING MD:  Jaime Ridgel- EP, CHIEF COMPLAINT:  AE COPD   History of Present Illness:  [name] is a 72 y/o gentleman with a history of chronic respiratory failure with hypoxia on 4 L home oxygen for the last 10 years, COPD, permanent A-fib who presented to the hospital for revision of his pacemaker and treatment of heart failure. 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 He developed worsened edema and symptoms of decompensated heart failure since implantation and was admitted for revision of his pacemaker. He has a history of COPD on 4L O2 for the past 10 years. He is on Spiriva monotherapy and uses albuterol PRN a few times per week. He still smokes occasionally but would consider quitting again. He had previously stopped for 4 years but started again due to boredom. At baseline he can walk about 1/8 mile before stopping due to dyspnea. At baseline he has DOE and wheezing. He has been off spiriva since admission and reports that he is wheezing more than his baseline. He has had green sputum and more than his baseline of sputum for about 4 days.   Pertinent  Medical History  COPD on 4L O2 Afib, PPM in place DM2 HTN HLD   Significant Hospital Events: Including procedures, antibiotic start and stop dates in addition to other pertinent events   12/11 ICD generator change, pacemaker lead revision  Interim History / Subjective:    Objective   Blood pressure 102/68, pulse 70, temperature 97.6 F (36.4 C), temperature source Oral, resp. rate 20, height 5\' 7"  (1.702 m), weight 92.5 kg, SpO2 92%.        Intake/Output Summary (Last 24 hours) at 07/09/2023 4742 Last data filed at 07/09/2023 0500 Gross per 24 hour  Intake 660 ml  Output 1270 ml  Net -610 ml   Filed Weights   07/08/23 0808  Weight: 92.5  kg    Examination: General: chronically ill appearing elderly man lying in bed in NAD HENT: [place]/AT, eyes anicteric Lungs: breathing comfortably on [place], bilateral wheezing Cardiovascular: S1S2, RRR Abdomen: obese, soft, NT Extremities: mild edema Neuro: awake, alert, moving all extremities Derm: warm, dry  CXR personally reviewed> Large retrosternal airspace. Flattened hemidiaphragms.   No PFTs on file.  Baseline bicarb 36 07/29/2018 ABG 7.39/71/74/32> was admitted for EP procedure during this admission  Resolved Hospital Problem list     Assessment & Plan:  Acute on chronic hypoxic and hypercapnic respiratory failure due to COPD; 4L baseline oxygen Acute COPD ecacerbation Former tobacco abuse -start prednisone 40mg  daily and azithromycin 250mg  daily x 5 days -recommend escalating from LAMA to triple inhaled therapy - starting Incruse & Breo here, and will plan to discharge on Trelegy inhaler. He will need to complete patient assistance paperwork for this and I recommend getting his first month filled from discharge pharmacy before he leaves the hospital to ensure he can still get this.  -duonebs PRN -supplemental O2 to maintain SpO2 >88%; wean back to baseline 4L -pulmonary hygiene, flutter valve to help mobilize secretions -recommend OP follow up with Pulmonology; he is hesitant to return to [facility] based on a negative previous experience, but I offered that we could always refer him to another pulmonologist in the area if he would prefer to get care elsewhere.    Best Practice (right  click and "Reselect all SmartList Selections" daily)    Labs   CBC: No results for input(s): "WBC", "NEUTROABS", "HGB", "HCT", "MCV", "PLT" in the last 168 hours.  Basic Metabolic Panel: Recent Labs  Lab 07/09/23 0314  NA 140  K 4.1  CL 98  CO2 36*  GLUCOSE 205*  BUN 11  CREATININE 0.96  CALCIUM 9.0   GFR: Estimated Creatinine Clearance: 75.5 mL/min (by C-G formula based  on SCr of 0.96 mg/dL). No results for input(s): "PROCALCITON", "WBC", "LATICACIDVEN" in the last 168 hours.  Liver Function Tests: No results for input(s): "AST", "ALT", "ALKPHOS", "BILITOT", "PROT", "ALBUMIN" in the last 168 hours. No results for input(s): "LIPASE", "AMYLASE" in the last 168 hours. No results for input(s): "AMMONIA" in the last 168 hours.  ABG    Component Value Date/Time   PHART 7.390 08/03/2018 1155   PCO2ART 71.2 (HH) 08/03/2018 1155   PO2ART 73.9 (L) 08/03/2018 1155   HCO3 42.3 (H) 08/03/2018 1155   O2SAT 95.3 08/03/2018 1155     Coagulation Profile: Recent Labs  Lab 07/08/23 0947  INR 1.1    Cardiac Enzymes: No results for input(s): "CKTOTAL", "CKMB", "CKMBINDEX", "TROPONINI" in the last 168 hours.  HbA1C: No results found for: "HGBA1C"  CBG: Recent Labs  Lab 07/08/23 0825  GLUCAP 136*    Review of Systems:   Review of Systems  Constitutional:  Negative for fever.  HENT: Negative.    Respiratory:  Positive for cough, sputum production, shortness of breath and wheezing.   Cardiovascular:  Positive for leg swelling.  Gastrointestinal:  Negative for heartburn.  Skin:  Negative for rash.     Past Medical History:  He,  has a past medical history of Alcohol abuse, Arthritis, Atrial fibrillation (HCC) (08/02/2018), CHF (congestive heart failure) (HCC) (03/12/2017), Chronic atrial fibrillation (HCC) (03/12/2017), Chronic respiratory failure (HCC), COPD (chronic obstructive pulmonary disease) (HCC) (03/12/2017), Dyspnea, Essential hypertension (03/12/2017), High risk medication use (05/05/2017), Hyperlipidemia (03/12/2017), Hypertensive heart disease with heart failure (HCC) (03/12/2017), ICD (implantable cardioverter-defibrillator) in place (07/30/2018), Ischemic cardiomyopathy (03/12/2017), Long term (current) use of anticoagulants (03/12/2017), Peripheral vascular disease (HCC) (03/12/2017), Preoperative cardiovascular examination (03/25/2015), Presence of  combination internal cardiac defibrillator (ICD) and pacemaker (03/12/2017), and Type 2 diabetes mellitus without complications (HCC) (03/12/2017).   Surgical History:   Past Surgical History:  Procedure Laterality Date   AV NODE ABLATION N/A 08/02/2018   Procedure: AV NODE ABLATION;  Surgeon: Marinus Maw, MD;  Location: [facility] INVASIVE CV LAB;  Service: Cardiovascular;  Laterality: N/A;   BIV ICD INSERTION CRT-D N/A 07/30/2018   Procedure: BIV ICD INSERTION CRT-D;  Surgeon: Marinus Maw, MD;  Location: [facility] OR;  Service: Cardiovascular;  Laterality: N/A;   BIV PACEMAKER GENERATOR CHANGEOUT N/A 05/27/2018   Procedure: BIV PACEMAKER GENERATOR UPGRADE - Aborted;  Surgeon: Regan Lemming, MD;  Location: [facility] INVASIVE CV LAB;  Service: Cardiovascular;  Laterality: N/A;   CHOLECYSTECTOMY     COLONOSCOPY     ERCP     stone removal   EXCISION OF BREAST BIOPSY     Age 73, benign   PACEMAKER LEAD REMOVAL N/A 07/30/2018   Procedure: PACEMAKER LEAD REMOVAL;  Surgeon: Marinus Maw, MD;  Location: [facility] OR;  Service: Cardiovascular;  Laterality: N/A;     Social History:   reports that he quit smoking about 4 years ago. His smoking use included cigarettes. He started smoking about 65 years ago. He has a 61 pack-year smoking history. He has never used  smokeless tobacco. He reports that he does not drink alcohol and does not use drugs.   Family History:  His family history includes Alcohol abuse in his brother and brother; Breast cancer in his sister.   Allergies Allergies  Allergen Reactions   Ivp Dye [Iodinated Contrast Media] Other (See Comments)    Constipation x 7 days     Home Medications  Prior to Admission medications   Medication Sig Start Date End Date Taking? Authorizing Provider  Cyanocobalamin (B-12) 1000 MCG TABS Take 1,000 mcg by mouth daily.   Yes [provider]  enalapril (VASOTEC) 2.5 MG tablet Take 2.5 mg by mouth daily. Per patient taking 1/2 tablet daily.   Yes  [provider]  eplerenone (INSPRA) 25 MG tablet TAKE ONE TABLET BY MOUTH EVERY DAY 05/18/23  Yes Baldo Daub, MD  ferrous sulfate 324 MG TBEC Take 324 mg by mouth daily.   Yes [provider]  furosemide (LASIX) 80 MG tablet Take 80 mg by mouth as directed. 2 tablets in the morning and 1 tablets in the evening   Yes [provider]  metFORMIN (GLUCOPHAGE-XR) 500 MG 24 hr tablet Take 1,000 mg by mouth 2 (two) times daily.    Yes [provider]  metoprolol succinate (TOPROL-XL) 25 MG 24 hr tablet TAKE 1 TABLET BY MOUTH ONCE DAILY WITH FOOD 12/08/22  Yes Baldo Daub, MD  potassium chloride SA (K-DUR,KLOR-CON) 20 MEQ tablet Take 20 mEq by mouth 2 (two) times daily.   Yes [provider]  rosuvastatin (CRESTOR) 20 MG tablet Take 20 mg by mouth daily.   Yes [provider]  tiotropium (SPIRIVA) 18 MCG inhalation capsule Place 18 mcg into inhaler and inhale daily.   Yes [provider]  TURMERIC EXTRA STRENGTH PO Take 1 tablet by mouth daily.   Yes [provider]  warfarin (COUMADIN) 1 MG tablet Take 1 mg by mouth See admin instructions. Take 1mg  (one tablet) on Thursdays, Saturdays, and Sundays. To be taken in addition to 5mg  tablet that is taken daily.   Yes [provider]  warfarin (COUMADIN) 5 MG tablet Take 5 mg by mouth daily at 8 pm. Patient takes an additional 1mg  tablet on Thursdays, Saturdays, and Sundays for a total of 6mg  on those days.   Yes [provider]  diphenhydrAMINE (BENADRYL) 25 mg capsule Take 25 mg by mouth 2 (two) times daily.    [provider]  OXYGEN Inhale 4 L into the lungs as needed.    [provider]     Critical care time:      Steffanie Dunn, DO 07/09/23 3:00 PM [facility]  For contact information, see Amion. If no response to pager, please call PCCM consult pager. After hours, 7PM- 7AM, please call Elink.

## 2023-07-09 NOTE — Discharge Instructions (Signed)
After Your ICD (Implantable Cardiac Defibrillator)   You have a [employer] ICD  ACTIVITY Do not lift your arm above shoulder height for 1 week after your procedure. After 7 days, you may progress as below.  You should remove your sling 24 hours after your procedure, unless otherwise instructed by your provider.     Thursday July 16, 2023  Friday July 17, 2023 Saturday July 18, 2023 Sunday July 19, 2023   Do not lift, push, pull, or carry anything over 10 pounds with the affected arm until 6 weeks (Thursday August 20, 2023 ) after your procedure.   You may drive AFTER your wound check, unless you have been told otherwise by your provider.   Ask your healthcare provider when you can go back to work   INCISION/Dressing If you are on a blood thinner such as Coumadin, Xarelto, Eliquis, Plavix, or Pradaxa please confirm with your provider when this should be resumed. Continue your usual warfarin dosing schedule unchanged  If large square, outer bandage is left in place, this can be removed after 24 hours from your procedure. Do not remove steri-strips or glue as below.   Monitor your defibrillator site for redness, swelling, and drainage. Call the device clinic at [phone] if you experience these symptoms or fever/chills.  If your incision is sealed with Steri-strips or staples, you may shower 7 days after your procedure or when told by your provider. Do not remove the steri-strips or let the shower hit directly on your site. You may wash around your site with soap and water.    If you were discharged in a sling, please do not wear this during the day more than 48 hours after your surgery unless otherwise instructed. This may increase the risk of stiffness and soreness in your shoulder.   Avoid lotions, ointments, or perfumes over your incision until it is well-healed.  You may use a hot tub or a pool AFTER your wound check appointment if the incision is completely  closed.  Your ICD is designed to protect you from life threatening heart rhythms. Because of this, you may receive a shock.   1 shock with no symptoms:  Call the office during business hours. 1 shock with symptoms (chest pain, chest pressure, dizziness, lightheadedness, shortness of breath, overall feeling unwell):  Call 911. If you experience 2 or more shocks in 24 hours:  Call 911. If you receive a shock, you should not drive for 6 months per the [place] DMV IF you receive appropriate therapy from your ICD.   ICD Alerts:  Some alerts are vibratory and others beep. These are NOT emergencies. Please call our office to let us know. If this occurs at night or on weekends, it can wait until the next business day. Send a remote transmission.  If your device is capable of reading fluid status (for heart failure), you will be offered monthly monitoring to review this with you.   DEVICE MANAGEMENT Remote monitoring is used to monitor your ICD from home. This monitoring is scheduled every 91 days by our office. It allows [place] to keep an eye on the functioning of your device to ensure it is working properly. You will routinely see your Electrophysiologist annually (more often if necessary).   You should receive your ID card for your new device in 4-8 weeks. Keep this card with you at all times once received. Consider wearing a medical alert bracelet or necklace.  Your ICD  may be MRI compatible. This  will be discussed at your next office visit/wound check.  You should avoid contact with strong electric or magnetic fields.   Do not use amateur (ham) radio equipment or electric (arc) welding torches. MP3 player headphones with magnets should not be used. Some devices are safe to use if held at least 12 inches (30 cm) from your defibrillator. These include power tools, lawn mowers, and speakers. If you are unsure if something is safe to use, ask your health care provider.  When using your cell phone, hold it to  the ear that is on the opposite side from the defibrillator. Do not leave your cell phone in a pocket over the defibrillator.  You may safely use electric blankets, heating pads, computers, and microwave ovens.  Call the office right away if: You have chest pain. You feel more than one shock. You feel more short of breath than you have felt before. You feel more light-headed than you have felt before. Your incision starts to open up.  This information is not intended to replace advice given to you by your health care provider. Make sure you discuss any questions you have with your health care provider.

## 2023-07-10 ENCOUNTER — Other Ambulatory Visit (HOSPITAL_COMMUNITY): Payer: Self-pay

## 2023-07-10 DIAGNOSIS — J441 Chronic obstructive pulmonary disease with (acute) exacerbation: Secondary | ICD-10-CM | POA: Diagnosis not present

## 2023-07-10 LAB — BASIC METABOLIC PANEL
Anion gap: 12 (ref 5–15)
BUN: 11 mg/dL (ref 8–23)
CO2: 30 mmol/L (ref 22–32)
Calcium: 8.8 mg/dL — ABNORMAL LOW (ref 8.9–10.3)
Chloride: 97 mmol/L — ABNORMAL LOW (ref 98–111)
Creatinine, Ser: 0.89 mg/dL (ref 0.61–1.24)
GFR, Estimated: >60 mL/min (ref >60)
Glucose, Bld: 144 mg/dL — ABNORMAL HIGH (ref 70–99)
Potassium: 3.6 mmol/L (ref 3.5–5.1)
Sodium: 139 mmol/L (ref 135–145)

## 2023-07-10 LAB — GLUCOSE, CAPILLARY
Glucose-Capillary: 143 mg/dL — ABNORMAL HIGH (ref 70–99)
Glucose-Capillary: 134 mg/dL — ABNORMAL HIGH (ref 70–99)

## 2023-07-10 LAB — PROTIME-INR
INR: 1 (ref 0.8–1.2)
Prothrombin Time: 13.6 s (ref 11.4–15.2)

## 2023-07-10 MED ORDER — PREDNISONE 10 MG PO TABS: 10.0000 mg | ORAL_TABLET | Freq: Every day | ORAL | 0 refills | Status: DC

## 2023-07-10 MED ORDER — UMECLIDINIUM BROMIDE 62.5 MCG/ACT IN AEPB: 1.0000 | INHALATION_SPRAY | Freq: Every day | RESPIRATORY_TRACT | Status: DC

## 2023-07-10 MED ORDER — PREDNISONE 10 MG PO TABS
10.0000 mg | ORAL_TABLET | Freq: Every day | ORAL | 0 refills | Status: DC
  Filled 2023-07-10: qty 8, 8d supply, fill #0

## 2023-07-10 MED ORDER — FLUTICASONE FUROATE-VILANTEROL 100-25 MCG/ACT IN AEPB: 1.0000 | INHALATION_SPRAY | Freq: Every day | RESPIRATORY_TRACT | Status: DC

## 2023-07-10 MED ORDER — WARFARIN SODIUM 5 MG PO TABS: 5.0000 mg | ORAL_TABLET | Freq: Once | ORAL | Status: DC

## 2023-07-10 MED ORDER — WARFARIN - PHARMACIST DOSING INPATIENT
Freq: Every day | Status: DC
Start: 2023-07-10 — End: 2023-07-10

## 2023-07-10 NOTE — Progress Notes (Signed)
PHARMACY - ANTICOAGULATION CONSULT NOTE  Pharmacy Consult for warfarin Indication: atrial fibrillation  Allergies  Allergen Reactions   Ivp Dye [Iodinated Contrast Media] Other (See Comments)    Constipation x 7 days    Patient Measurements: Height: 5\' 7"  (170.2 cm) Weight: 92.5 kg (204 lb) (Taken previously in ER) IBW/kg (Calculated) : 66.1   Vital Signs: Temp: 98 F (36.7 C) (12/13 0453) Temp Source: Oral (12/13 0453) BP: 148/67 (12/13 0453) Pulse Rate: 61 (12/12 2059)  Labs: Recent Labs    07/08/23 0947 07/09/23 0314 07/09/23 1104 07/10/23 0427  HGB  --   --  11.2*  --   HCT  --   --  34.6*  --   PLT  --   --  252  --   LABPROT 14.6  --   --  13.6  INR 1.1  --   --  1.0  CREATININE  --  0.96  --  0.89    Estimated Creatinine Clearance: 81.4 mL/min (by C-G formula based on SCr of 0.89 mg/dL).   Medical History: Past Medical History:  Diagnosis Date   Alcohol abuse    Arthritis    Atrial fibrillation (HCC) 08/02/2018   CHF (congestive heart failure) (HCC) 03/12/2017   Chronic atrial fibrillation (HCC) 03/12/2017   Chronic respiratory failure (HCC)    COPD (chronic obstructive pulmonary disease) (HCC) 03/12/2017   Dyspnea    wears Oxygen 24/7 3 L   Essential hypertension 03/12/2017   High risk medication use 05/05/2017   Hyperlipidemia 03/12/2017   Hypertensive heart disease with heart failure (HCC) 03/12/2017   ICD (implantable cardioverter-defibrillator) in place 07/30/2018   Ischemic cardiomyopathy 03/12/2017   Long term (current) use of anticoagulants 03/12/2017   Peripheral vascular disease (HCC) 03/12/2017   Preoperative cardiovascular examination 03/25/2015   Presence of combination internal cardiac defibrillator (ICD) and pacemaker 03/12/2017   unable to place a CRT    Type 2 diabetes mellitus without complications (HCC) 03/12/2017    Medications:  Medications Prior to Admission  Medication Sig Dispense Refill Last Dose/Taking   Cyanocobalamin (B-12)  1000 MCG TABS Take 1,000 mcg by mouth daily.   07/07/2023   enalapril (VASOTEC) 2.5 MG tablet Take 2.5 mg by mouth daily. Per patient taking 1/2 tablet daily.   07/07/2023   eplerenone (INSPRA) 25 MG tablet TAKE ONE TABLET BY MOUTH EVERY DAY 90 tablet 3 07/07/2023   ferrous sulfate 324 MG TBEC Take 324 mg by mouth daily.   Past Week   furosemide (LASIX) 80 MG tablet Take 80 mg by mouth as directed. 2 tablets in the morning and 1 tablets in the evening   07/07/2023   metFORMIN (GLUCOPHAGE-XR) 500 MG 24 hr tablet Take 1,000 mg by mouth 2 (two) times daily.    07/07/2023   metoprolol succinate (TOPROL-XL) 25 MG 24 hr tablet TAKE 1 TABLET BY MOUTH ONCE DAILY WITH FOOD 90 tablet 3 07/07/2023   potassium chloride SA (K-DUR,KLOR-CON) 20 MEQ tablet Take 20 mEq by mouth 2 (two) times daily.   07/07/2023   rosuvastatin (CRESTOR) 20 MG tablet Take 20 mg by mouth daily.   07/07/2023   tiotropium (SPIRIVA) 18 MCG inhalation capsule Place 18 mcg into inhaler and inhale daily.   07/07/2023   TURMERIC EXTRA STRENGTH PO Take 1 tablet by mouth daily.   Past Week   warfarin (COUMADIN) 1 MG tablet Take 1 mg by mouth See admin instructions. Take 1mg  (one tablet) on Thursdays, Saturdays, and Sundays. To be  taken in addition to 5mg  tablet that is taken daily.   07/05/2023   warfarin (COUMADIN) 5 MG tablet Take 5 mg by mouth daily at 8 pm. Patient takes an additional 1mg  tablet on Thursdays, Saturdays, and Sundays for a total of 6mg  on those days.   07/05/2023   diphenhydrAMINE (BENADRYL) 25 mg capsule Take 25 mg by mouth 2 (two) times daily.   More than a month   OXYGEN Inhale 4 L into the lungs as needed.      Scheduled:   azithromycin  250 mg Oral Daily   fluticasone furoate-vilanterol  1 puff Inhalation Daily   furosemide  80 mg Oral Q breakfast   insulin aspart  0-15 Units Subcutaneous TID WC   metoprolol succinate  25 mg Oral Q supper   predniSONE  10 mg Oral Q breakfast   spironolactone  25 mg Oral Daily    umeclidinium bromide  1 puff Inhalation Daily    Assessment: 72 yo male s/p PPM on 12/11 on warfarin PTA for history of afib. Warfarin resumed 12/12 per MD and dosing now per pharmacy with plans to avoid rapid INR rise  -INR= 1.0 -noted with COPD on azithromycin and prednisone which may increase warfarin sensitivity  Home dose: 5mg /day except take 6mg  on ThSaSu   Goal of Therapy:  INR 2-3 Monitor platelets by anticoagulation protocol: Yes   Plan:  -Warfarin 5mg  today -Daily INR  Harland German, PharmD Clinical Pharmacist **Pharmacist phone directory can now be found on [email or web address] ([email or web address]).  Listed under [facility].

## 2023-07-10 NOTE — Progress Notes (Cosign Needed Addendum)
Rounding Note    Patient Name: Jaime Scott Date of Encounter: 07/10/2023  [facility] Cardiologist: Dr. Dulce Scott EP: Dr. Elberta Scott >> Dr. Ladona Scott   Subjective   Flutter valve has been helpful, a bit less SOB this AM though not breathing to baseline, intermittently very SOB  Sats 90's on his home 4L  Inpatient Medications    Scheduled Meds:  azithromycin  250 mg Oral Daily   fluticasone furoate-vilanterol  1 puff Inhalation Daily   furosemide  80 mg Oral Q breakfast   insulin aspart  0-15 Units Subcutaneous TID WC   metoprolol succinate  25 mg Oral Q supper   predniSONE  10 mg Oral Q breakfast   spironolactone  25 mg Oral Daily   umeclidinium bromide  1 puff Inhalation Daily   Warfarin - Physician Dosing Inpatient   Does not apply q1600   Continuous Infusions:  PRN Meds: acetaminophen, ipratropium-albuterol, ondansetron (ZOFRAN) IV, mouth rinse   Vital Signs    Vitals:   07/09/23 1329 07/09/23 2059 07/09/23 2247 07/10/23 0453  BP: 119/61 139/64  (!) 148/67  Pulse: 62 61    Resp: 20   17  Temp: 97.8 F (36.6 C) 97.9 F (36.6 C)  98 F (36.7 C)  TempSrc: Oral Oral  Oral  SpO2: 92% 95% 91%   Weight:      Height:        Intake/Output Summary (Last 24 hours) at 07/10/2023 0738 Last data filed at 07/10/2023 0733 Gross per 24 hour  Intake 360 ml  Output 2075 ml  Net -1715 ml      07/09/2023    7:30 AM 07/08/2023    8:08 AM 06/17/2023   11:09 AM  Last 3 Weights  Weight (lbs) 204 lb 204 lb 200 lb 3.2 oz  Weight (kg) 92.534 kg 92.534 kg 90.81 kg      Telemetry    V paced, one NSVT - Personally Reviewed  ECG    No new EKGs - Personally Reviewed  Physical Exam   No significant changes to his exam this AM GEN: No acute distress.   Neck: No JVD Cardiac: RRR, no murmurs, rubs, or gallops.  Respiratory: not much air movement, with some end exp wheezes GI: Soft, nontender, non-distended  MS: No edema; No deformity. Neuro:  Nonfocal   Psych: Normal affect   Labs    High Sensitivity Troponin:  No results for input(s): "TROPONINIHS" in the last 720 hours.   Chemistry Recent Labs  Lab 07/09/23 0314 07/10/23 0427  NA 140 139  K 4.1 3.6  CL 98 97*  CO2 36* 30  GLUCOSE 205* 144*  BUN 11 11  CREATININE 0.96 0.89  CALCIUM 9.0 8.8*  GFRNONAA >60 >60  ANIONGAP 6 12    Lipids No results for input(s): "CHOL", "TRIG", "HDL", "LABVLDL", "LDLCALC", "CHOLHDL" in the last 168 hours.  Hematology Recent Labs  Lab 07/09/23 1104  WBC 9.9  RBC 3.67*  HGB 11.2*  HCT 34.6*  MCV 94.3  MCH 30.5  MCHC 32.4  RDW 12.4  PLT 252   Thyroid No results for input(s): "TSH", "FREET4" in the last 168 hours.  BNPNo results for input(s): "BNP", "PROBNP" in the last 168 hours.  DDimer No results for input(s): "DDIMER" in the last 168 hours.   Radiology    EP PPM/ICD IMPLANT Result Date: 07/09/2023 Conclusion: Successful insertion of a new left ventricular pacing lead in the lateral vein of the coronary sinus with right  upper extremity venography utilizing the right internal jugular vein with successful tunneling of the new lead into the previously implanted right infraclavicular pocket with removal of the previously implanted biventricular ICD which had reached elective replacement, and insertion of a new biventricular ICD. Jaime Bunting, MD   DG Chest 2 View Result Date: 07/09/2023 CLINICAL DATA:  ICD EXAM: CHEST - 2 VIEW COMPARISON:  X-ray 10/07/2021. FINDINGS: Hyperinflation. Enlarged cardiopericardial silhouette. Vascular congestion. No pneumothorax or effusion. No consolidation. Right upper chest battery pack with leads overlying the right side of the heart. Leads has a different configuration than previous. Please correlate with history. Films are under penetrated. Overlapping cardiac leads. IMPRESSION: Enlarged cardiopericardial silhouette with vascular congestion. Right upper chest pacemaker/defibrillator. Electronically Signed    By: Jaime Scott M.D.   On: 07/09/2023 10:22    Cardiac Studies    10/07/21: TTE  1. GLS -9.9. Left ventricular ejection fraction, by estimation, is 45 to  50%. The left ventricle has mildly decreased function. The left ventricle  has no regional wall motion abnormalities. The left ventricular internal  cavity size was mildly dilated.  Left ventricular diastolic parameters are indeterminate.   2. Right ventricular systolic function is normal. The right ventricular  size is normal. There is normal pulmonary artery systolic pressure.   3. Left atrial size was moderately dilated.   4. Right atrial size was mildly dilated.   5. The mitral valve is normal in structure. Mild mitral valve  regurgitation. No evidence of mitral stenosis.   6. The aortic valve is normal in structure. Aortic valve regurgitation is  mild. No aortic stenosis is present.   7. There is moderate dilatation of the ascending aorta, measuring 41 mm.   8. The inferior vena cava is normal in size with greater than 50%  respiratory variability, suggesting right atrial pressure of 3 mmHg.   Patient Profile     72 y.o. male w/PMHx of NICM, chronic CHF (systolic), LBBB, DM, HTN, HLD, permanent AFib, and COPD/chronic respiratory failure (on 4l at home) came out patient for ICD LV lead revision.  08/02/2018: underwent ICD system extraction from the R side (2/2 occluded subclavian vein and prior failed attempts at CS lead) >> underwent new R sided implant >>> since then developed high LV thresholds >>> Lead noted to have fallen/retracted back into the RV, was ERI >> underwent gen change, new LV lead placed via R internal jugular approach and the original LV lead abandoned  Assessment & Plan    ICD gen change/lead revision Procedure as described above and procedure report Site remains stable No bleeding or hematoma  2. COPD 3. Chronic respiratory failure Responded well to IV lasix and nebulizer last night SOB is improved,  he noted times that he felt very SOB last night seemed to coincide with his observation of O2 sats 70's, got better with sitting/up/deep breathing Flutter valve is helping him a lot   Pulmonary team consulted and much appreciated Felt to be a COPD exacerbation Inhalers/neb adjusted Started on prednisone and azithromycin. (Not on an AADs, QTc looks OK) Recommended he have formal pulmonary out patient though patient seemed reluctant >> discussed further today, he may be agreeable to pulmonology team in [place]> will talk with his PMD  Dr. Elberta Scott suspects volume stable > home lasix  Discharge timing pending pulmonary recs   4. Permanent Afib INR 1.0 Will ask pharmacy on board for management > avoid sharp rise with recent procedure 6mg  tonight to start back up on  his home regime INR daily  Has coumadin clinic check next week in place with his PMD office  5. NICM Acute/on chronic CHF Urinating BMET daily Stable creat Cumulatively neg -2125 Do not suspect significant volume OL at this juncture  6. DM SSI ordered    For questions or updates, please contact [facility] Please consult [email or web address] for contact info under        Signed, Sheilah Pigeon, PA-C  07/10/2023, 7:38 AM    Patient discharge today. Review dc summary for further data.

## 2023-07-10 NOTE — Plan of Care (Signed)
  Problem: Education: Goal: Knowledge of cardiac device and self-care will improve Outcome: Progressing Goal: Ability to safely manage health related needs after discharge will improve Outcome: Progressing Goal: Individualized Educational Video(s) Outcome: Progressing   Problem: Cardiac: Goal: Ability to achieve and maintain adequate cardiopulmonary perfusion will improve Outcome: Progressing   Problem: Education: Goal: Knowledge of General Education information will improve Description: Including pain rating scale, medication(s)/side effects and non-pharmacologic comfort measures Outcome: Progressing   Problem: Health Behavior/Discharge Planning: Goal: Ability to manage health-related needs will improve Outcome: Progressing   Problem: Clinical Measurements: Goal: Ability to maintain clinical measurements within normal limits will improve Outcome: Progressing Goal: Will remain free from infection Outcome: Progressing Goal: Diagnostic test results will improve Outcome: Progressing Goal: Respiratory complications will improve Outcome: Progressing Goal: Cardiovascular complication will be avoided Outcome: Progressing   Problem: Activity: Goal: Risk for activity intolerance will decrease Outcome: Progressing   Problem: Nutrition: Goal: Adequate nutrition will be maintained Outcome: Progressing   Problem: Coping: Goal: Level of anxiety will decrease Outcome: Progressing   Problem: Elimination: Goal: Will not experience complications related to bowel motility Outcome: Progressing Goal: Will not experience complications related to urinary retention Outcome: Progressing   Problem: Pain Management: Goal: General experience of comfort will improve Outcome: Progressing   Problem: Safety: Goal: Ability to remain free from injury will improve Outcome: Progressing   Problem: Skin Integrity: Goal: Risk for impaired skin integrity will decrease Outcome: Progressing    Problem: Education: Goal: Ability to describe self-care measures that may prevent or decrease complications (Diabetes Survival Skills Education) will improve Outcome: Progressing Goal: Individualized Educational Video(s) Outcome: Progressing   Problem: Coping: Goal: Ability to adjust to condition or change in health will improve Outcome: Progressing   Problem: Fluid Volume: Goal: Ability to maintain a balanced intake and output will improve Outcome: Progressing   Problem: Health Behavior/Discharge Planning: Goal: Ability to identify and utilize available resources and services will improve Outcome: Progressing Goal: Ability to manage health-related needs will improve Outcome: Progressing   Problem: Metabolic: Goal: Ability to maintain appropriate glucose levels will improve Outcome: Progressing   Problem: Nutritional: Goal: Maintenance of adequate nutrition will improve Outcome: Progressing Goal: Progress toward achieving an optimal weight will improve Outcome: Progressing   Problem: Skin Integrity: Goal: Risk for impaired skin integrity will decrease Outcome: Progressing   Problem: Tissue Perfusion: Goal: Adequacy of tissue perfusion will improve Outcome: Progressing

## 2023-07-10 NOTE — Discharge Summary (Cosign Needed)
ELECTROPHYSIOLOGY PROCEDURE DISCHARGE SUMMARY    Patient ID: Jaime Scott,  MRN: [id number], DOB/AGE: August 08, 1950 72 y.o.  Admit date: 07/08/2023 Discharge date: 07/10/2023  Primary Care Physician: Alinda Deem, MD  Primary Cardiologist: Dr. Dulce Sellar Electrophysiologist: Dr. Ladona Ridgel  Primary Discharge Diagnosis:  ICD lead dislodgement and ERI status COPD exacerbation Acute/chronic hypoxic and hypercapnic respiratory failure  On home O2 4L Acute/chronic CHF exacerbation Diuresed with IV lasix  Secondary Discharge Diagnosis:  NICM Permanent AFib CHA2DS2Vasc is 4, on warfarin HTN DM   Allergies  Allergen Reactions   Ivp Dye [Iodinated Contrast Media] Other (See Comments)    Constipation x 7 days     Procedures This Admission:  1.  ICD LV lead revision and generator change 07/08/23 2.  CXR on 07/09/23 demonstrated no pneumothorax status post device implantation.   Brief HPI: Jaime Scott is a 72 y.o. male is followed out patient for his ICD with EP service.  Noted to have reached ERI and that his LV lead threshold were high, CXR noted LV lead had dislodged to RV and recommended lead revision and generator change   Hospital Course:  The patient was admitted and underwent lead revision andICD generator change as noted in the procedure report.  On arrival to the cath lab was hypoxic 88% on his home 4L.  Post procedure diuresed with IV lasix, and admitted.  Pulmonary consulted as well with his poor baseline pulmonary status.  Felt to also have COPD exacerbation and started on different inhalers/prednisone and azithromycin. Home warfarin regime restarted.  Left neck/chest was without hematoma or ecchymosis.  The device was interrogated POD #1 and found to be functioning normally.  CXR was obtained POD #1 and demonstrated no pneumothorax status post device implantation.  Wound care, arm mobility, and restrictions were reviewed with the patient.  Pulmonary team  cleared for discharge today with no wheezing and back oin his home O2 4L.  The patient feels better today, breathing at his baseline, minimal site discomfort, no CP.  He was examined by Drs. Camnitz and Mannam and considered stable for discharge to home.   INR today 1.0 His home regime resume yesterday post procedure INR check scheduled at his PMD office for 12/16  Pulmonary cleared for discharge with recommendation: Stop Spriva No  home antibiotic at this time Home on prednisone 10mg  for 10 days (has has gotten 2 days here > will plan home for 8 more days      Trelegy inhaler recommended and patient assistance request has been started  In the meantime to go home with the Breo and Incruse inhalers that he has been getting here The patient is aware  Given office information for Dr. Esther Hardy to call for consult/pulmonary care PMD office follow made (also placed on their wait list for an earlier appointment if one should open up)  Physical Exam: Vitals:   07/09/23 2059 07/09/23 2247 07/10/23 0453 07/10/23 1212  BP: 139/64  (!) 148/67 132/78  Pulse: 61     Resp:   17   Temp: 97.9 F (36.6 C)  98 F (36.7 C) 98 F (36.7 C)  TempSrc: Oral  Oral Oral  SpO2: 95% 91%    Weight:      Height:        GEN- The patient is well appearing, alert and oriented x 3 today.   HEENT: normocephalic, atraumatic; sclera clear, conjunctiva pink; hearing intact; oropharynx clear Lungs-  CTA b/l, normal work of  breathing.  No wheezes, rales, rhonchi Heart- RRR, no murmurs, rubs or gallops, PMI not laterally displaced GI- soft, non-tender, non-distended Extremities- no clubbing, cyanosis, or edema MS- no significant deformity or atrophy Skin- warm and dry, no rash or lesion, left neck/chest without hematoma/ecchymosis Psych- euthymic mood, full affect Neuro- no gross defecits  Labs:   Lab Results  Component Value Date   WBC 9.9 07/09/2023   HGB 11.2 (L) 07/09/2023   HCT 34.6 (L) 07/09/2023   MCV  94.3 07/09/2023   PLT 252 07/09/2023    Recent Labs  Lab 07/10/23 0427  NA 139  K 3.6  CL 97*  CO2 30  BUN 11  CREATININE 0.89  CALCIUM 8.8*  GLUCOSE 144*    Discharge Medications:  Allergies as of 07/10/2023       Reactions   Ivp Dye [iodinated Contrast Media] Other (See Comments)   Constipation x 7 days        Medication List     STOP taking these medications    tiotropium 18 MCG inhalation capsule Commonly known as: SPIRIVA       TAKE these medications    B-12 1000 MCG Tabs Take 1,000 mcg by mouth daily.   diphenhydrAMINE 25 mg capsule Commonly known as: BENADRYL Take 25 mg by mouth 2 (two) times daily.   enalapril 2.5 MG tablet Commonly known as: VASOTEC Take 2.5 mg by mouth daily. Per patient taking 1/2 tablet daily.   eplerenone 25 MG tablet Commonly known as: INSPRA TAKE ONE TABLET BY MOUTH EVERY DAY   ferrous sulfate 324 MG Tbec Take 324 mg by mouth daily.   fluticasone furoate-vilanterol 100-25 MCG/ACT Aepb Commonly known as: BREO ELLIPTA Inhale 1 puff into the lungs daily. Start taking on: July 11, 2023   furosemide 80 MG tablet Commonly known as: LASIX Take 80 mg by mouth as directed. 2 tablets in the morning and 1 tablets in the evening   metFORMIN 500 MG 24 hr tablet Commonly known as: GLUCOPHAGE-XR Take 1,000 mg by mouth 2 (two) times daily.   metoprolol succinate 25 MG 24 hr tablet Commonly known as: TOPROL-XL TAKE 1 TABLET BY MOUTH ONCE DAILY WITH FOOD   OXYGEN Inhale 4 L into the lungs as needed.   potassium chloride SA 20 MEQ tablet Commonly known as: KLOR-CON M Take 20 mEq by mouth 2 (two) times daily.   predniSONE 10 MG tablet Commonly known as: DELTASONE Take 1 tablet (10 mg total) by mouth daily with breakfast. This is a one time prescription, no refills needed Start taking on: July 11, 2023   rosuvastatin 20 MG tablet Commonly known as: CRESTOR Take 20 mg by mouth daily.   TURMERIC EXTRA STRENGTH  PO Take 1 tablet by mouth daily.   umeclidinium bromide 62.5 MCG/ACT Aepb Commonly known as: INCRUSE ELLIPTA Inhale 1 puff into the lungs daily. Start taking on: July 11, 2023   warfarin 5 MG tablet Commonly known as: COUMADIN Take 5 mg by mouth daily at 8 pm. Patient takes an additional 1mg  tablet on Thursdays, Saturdays, and Sundays for a total of 6mg  on those days. Notes to patient: Continue your usual dosing regime until further recommendation by your primary doctor after your coumadin check next week   warfarin 1 MG tablet Commonly known as: COUMADIN Take 1 mg by mouth See admin instructions. Take 1mg  (one tablet) on Thursdays, Saturdays, and Sundays. To be taken in addition to 5mg  tablet that is taken daily.  Disposition:  Discharge Instructions     Diet - low sodium heart healthy   Complete by: As directed    Increase activity slowly   Complete by: As directed        Follow-up Information     Marcellus Scott, MD Follow up.   Specialty: Specialist Why: This is the pulmonologist in [place].  His office is closed on Friday please call MONDAY to get an appoinment to help manage your lung disease Contact information: [address] [place] [address] [phone]         Alinda Deem, MD Follow up on 07/10/2023.   Specialty: Family Medicine Why: 07/13/23 @ 10:15AM for coumadin lab check 07/28/23 @ 9:40 AM office visit you will also be on thier call list for any available sooner visit that opens up Contact information: [address] [place] [address] [phone]                 Duration of Discharge Encounter: Greater than 30 minutes including physician time.  Signed, Francis Dowse, PA-C 07/10/2023 1:35 PM    I have seen and examined this patient with Francis Dowse.  Agree with above, note added to reflect my findings.  Respiratory status improved. On baseline oxygen. Admit for gen change and LV  lead addition. Had CHF and COPD exacerbation. Pulmonary consulted and diuresed. Respiratory status improved and OK for discharge.  GEN: Well nourished, well developed, in no acute distress  HEENT: normal  Neck: no JVD, carotid bruits, or masses Cardiac: RRR; no murmurs, rubs, or gallops,no edema  Respiratory:  clear to auscultation bilaterally, normal work of breathing GI: soft, nontender, nondistended, + BS MS: no deformity or atrophy  Skin: warm and dry, device site well healed Neuro:  Strength and sensation are intact Psych: euthymic mood, full affect    Will M. Camnitz MD 07/12/2023 10:29 AM

## 2023-07-10 NOTE — Progress Notes (Addendum)
NAME:  Jaime Scott, MRN:  [id number], DOB:  04-20-1951, LOS: 1 ADMISSION DATE:  07/08/2023, CONSULTATION DATE:  12/12 REFERRING MD:  Ladona Ridgel- EP, CHIEF COMPLAINT:  AE COPD   History of Present Illness:   Jaime Scott is a 72 y/o gentleman with a history of chronic respiratory failure with hypoxia on 4 L home oxygen for the last 10 years, COPD, permanent A-fib who presented to the hospital for revision of his pacemaker and treatment of heart failure. 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 He developed worsened edema and symptoms of decompensated heart failure since implantation and was admitted for revision of his pacemaker. He has a history of COPD on 4L O2 for the past 10 years. He is on Spiriva monotherapy and uses albuterol PRN a few times per week. He still smokes occasionally but would consider quitting again. He had previously stopped for 4 years but started again due to boredom. At baseline he can walk about 1/8 mile before stopping due to dyspnea. At baseline he has DOE and wheezing. He has been off spiriva since admission and reports that he is wheezing more than his baseline. He has had green sputum and more than his baseline of sputum for about 4 days.   Pertinent  Medical History  COPD on 4L O2 Afib, PPM in place DM2 HTN HLD  Significant Hospital Events: Including procedures, antibiotic start and stop dates in addition to other pertinent events   12/11 ICD generator change, pacemaker lead revision  Interim History / Subjective:   States that breathing is better.  Continues on supplemental oxygen  Objective   Blood pressure (!) 148/67, pulse 61, temperature 98 F (36.7 C), temperature source Oral, resp. rate 17, height 5\' 7"  (1.702 m), weight 92.5 kg, SpO2 91%.        Intake/Output Summary (Last 24 hours) at 07/10/2023 0912 Last data filed at 07/10/2023 0733 Gross per 24 hour  Intake --  Output 2075  ml  Net -2075 ml   Filed Weights   07/08/23 0808 07/09/23 0730  Weight: 92.5 kg 92.5 kg    Examination: Gen:      No acute distress HEENT:  EOMI, sclera anicteric Neck:     No masses; no thyromegaly Lungs:    Diminished air entry, no wheeze noted. CV:         Regular rate and rhythm; no murmurs Abd:      + bowel sounds; soft, non-tender; no palpable masses, no distension Ext:    No edema; adequate peripheral perfusion Skin:      Warm and dry; no rash Neuro: alert and oriented x 3 Psych: normal mood and affect   PFTs in 2020 reviewed with severe COPD, FEV1 35% and severe diffusion impairment  Resolved Hospital Problem list     Assessment & Plan:  Acute on chronic hypoxic and hypercapnic respiratory failure due to COPD; 4L baseline oxygen Acute COPD ecacerbation Ongoing smoking   Continue prednisone at 10 mg daily.  He is not wheezing today and will be okay to continue at that dose until discharge Started on Breo and Incruse.  Can be discharged on Trelegy inhaler.  Patient is on Spiriva as an outpatient He will need assistance to get medications at home Continue flutter valve to help mobilize with secretion Continue supplemental oxygen  Health maintenance Will need smoking cessation, lung cancer screening CTs and evaluation of sleep apnea as an outpatient He  had a sleep study in 2020 with diagnosis of severe sleep apnea with desats and is not on CPAP This has been recommended to him in the past but he has been noncompliant He prefers to follow-up with Dr. Blenda Nicely at [facility] (right click and "Reselect all SmartList Selections" daily)   Per primary team  Signature:    Chilton Greathouse MD [facility] See Amion for pager  If no response to pager , please call [phone] until 7pm After 7:00 pm call Elink  [phone] 07/10/2023, 9:13 AM

## 2023-07-13 NOTE — Progress Notes (Signed)
Remote ICD transmission.   

## 2023-07-15 DIAGNOSIS — Z7901 Long term (current) use of anticoagulants: Secondary | ICD-10-CM | POA: Diagnosis not present

## 2023-07-20 DIAGNOSIS — J441 Chronic obstructive pulmonary disease with (acute) exacerbation: Secondary | ICD-10-CM | POA: Diagnosis not present

## 2023-07-20 DIAGNOSIS — J9611 Chronic respiratory failure with hypoxia: Secondary | ICD-10-CM | POA: Diagnosis not present

## 2023-07-20 NOTE — Progress Notes (Unsigned)
Electrophysiology Office Note:   Date:  07/21/2023  ID:  Jaime Scott, DOB 10-27-1950, MRN [id number]  Primary Cardiologist: None Primary Heart Failure: None Electrophysiologist: [name], MD       History of Present Illness:   Jaime Scott is a 72 y.o. male with h/o permanent AF s/p AV nodal ablation, LBBB, dilated cardiomyopathy / ICM s/p ICD, HTN, HLD, PVD, COPD, OSA, DM II, Tobacco / ETOH abuse  seen today for post hospital follow up / wound check.    Admitted 12/11-12/13/24 for planned generator change and revision of LV pacing lead.  Since discharge from hospital the patient reports he is feeling overall better. Had to see his PCP yesterday for a COPD exacerbation. Treated with steroids.    He denies chest pain, palpitations, dyspnea, PND, orthopnea, nausea, vomiting, dizziness, syncope, edema, weight gain, or early satiety.   Review of systems complete and found to be negative unless listed in HPI.   EP Information / Studies Reviewed:        ICD Interrogation-  reviewed in detail today,  See PACEART report.  Device History: Medtronic BiV ICD ICD implanted 2017, CRT-D upgrade 07/30/2018 for sCHF/ICM.  Complicated lead history > previous left sided device. Had elevated treshold LV1-LV2 > programmed LV 1 to RV coil with best threshold and best battery life. EKG relatively unchanged and prior attempts V-V optimization was ineffective at narrowing QRS any further or making V1 more positive.   CRT-D 07/08/23 > new LV pacing lead, & generator change   History of appropriate therapy: No History of AAD therapy: unknown, prior [facility] notes reflect "rate control + chronic anticoagulation"     Studies:  ECHO 09/2021 > 45-50%, no RWMA, LV cavity mildly dilated, LA moderately dilated, RA mildly dilated      Arrhythmia / AAD Permanent AF s/p AV nodal ablation     Risk Assessment/Calculations:    CHA2DS2-VASc Score = 5   This indicates a 7.2% annual risk of stroke. The  patient's score is based upon: CHF History: 1 HTN History: 1 Diabetes History: 1 Stroke History: 0 Vascular Disease History: 1 Age Score: 1 Gender Score: 0             Physical Exam:   VS:  BP 120/70   Pulse 79   Ht 5\' 7"  (1.702 m)   Wt 197 lb (89.4 kg)   SpO2 94%   BMI 30.85 kg/m    Wt Readings from Last 3 Encounters:  07/21/23 197 lb (89.4 kg)  07/09/23 204 lb (92.5 kg)  06/17/23 200 lb 3.2 oz (90.8 kg)     GEN: Well nourished, well developed in no acute distress NECK: No JVD; No carotid bruits CARDIAC: Regular rate and rhythm, SEM, rubs, gallops.  Steri strips removed with adhesive remover, implant site well healed no significant bruising or erythema, R internal jugular site well healed as well.  No hematoma at pocket site.   RESPIRATORY:  Clear to auscultation without rales, wheezing or rhonchi  ABDOMEN: Soft, non-tender, non-distended EXTREMITIES:  No edema; No deformity   ASSESSMENT AND PLAN:    Chronic Systolic Dysfunction s/p Medtronic CRT-D  -new LV lead 07/08/23 with generator change  -steri-strips removed, wound stable -99.2% effective BiV pacing (was ~68% prior to change out of CS lead, overall color better in clinic) -euvolemic today -Stable on an appropriate medical regimen -Normal ICD function -See Pace Art report -No changes today  Permanent AF AV Nodal Ablation  -OAC for stroke  prophylaxis  -ventricular rates well controlled      Secondary Hypercoagulable State  -warfarin per PCP    Hypertension  -well controlled on current regimen     HLD -per Cardiology    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 Ladona Ridgel in 3 months for post device check.    Signed, Canary Brim, MSN, APRN, NP-C, AGACNP-BC [facility] - Electrophysiology  07/21/2023, 11:57 AM

## 2023-07-21 ENCOUNTER — Encounter: Payer: Self-pay | Admitting: Pulmonary Disease

## 2023-07-21 ENCOUNTER — Ambulatory Visit: Payer: Medicare Other | Attending: Pulmonary Disease | Admitting: Pulmonary Disease

## 2023-07-21 VITALS — BP 120/70 | HR 79 | Ht 67.0 in | Wt 197.0 lb

## 2023-07-21 DIAGNOSIS — I5020 Unspecified systolic (congestive) heart failure: Secondary | ICD-10-CM | POA: Insufficient documentation

## 2023-07-21 DIAGNOSIS — Z95810 Presence of automatic (implantable) cardiac defibrillator: Secondary | ICD-10-CM | POA: Diagnosis not present

## 2023-07-21 LAB — CUP PACEART INCLINIC DEVICE CHECK
Date Time Interrogation Session: 20241224115417
Implantable Lead Connection Status: 753985
Implantable Lead Connection Status: 753985
Implantable Lead Implant Date: 20200103
Implantable Lead Implant Date: 20241211
Implantable Lead Location: 753858
Implantable Lead Location: 753860
Implantable Lead Model: 4398
Implantable Lead Model: 6935
Implantable Pulse Generator Implant Date: 20241211

## 2023-07-21 NOTE — Patient Instructions (Addendum)
Medication Instructions:  Your physician recommends that you continue on your current medications as directed. Please refer to the Current Medication list given to you today.  *If you need a refill on your cardiac medications before your next appointment, please call your pharmacy*  Lab Work: None ordered If you have labs (blood work) drawn today and your tests are completely normal, you will receive your results only by: MyChart Message (if you have MyChart) OR A paper copy in the mail If you have any lab test that is abnormal or we need to change your treatment, we will call you to review the results.  Follow-Up: At [facility], you and your health needs are our priority.  As part of our continuing mission to provide you with exceptional heart care, we have created designated Provider Care Teams.  These Care Teams include your primary Cardiologist (physician) and Advanced Practice Providers (APPs -  Physician Assistants and Nurse Practitioners) who all work together to provide you with the care you need, when you need it.  We recommend signing up for the patient portal called "MyChart".  Sign up information is provided on this After Visit Summary.  MyChart is used to connect with patients for Virtual Visits (Telemedicine).  Patients are able to view lab/test results, encounter notes, upcoming appointments, etc.  Non-urgent messages can be sent to your provider as well.   To learn more about what you can do with MyChart, go to [email or web address].    Your next appointment:   As scheduled       After Your ICD (Implantable Cardiac Defibrillator)    Monitor your defibrillator site for redness, swelling, and drainage. Call the device clinic at [phone] if you experience these symptoms or fever/chills.  Your incision was closed with Steri-strips or staples:  You may shower 7 days after your procedure and wash your incision with soap and water. Avoid lotions, ointments, or  perfumes over your incision until it is well-healed.  You may use a hot tub or a pool after your wound check appointment if the incision is completely closed.  Do not lift, push or pull greater than 10 pounds with the affected arm until 6 weeks after your procedure. There are no other restrictions in arm movement after your wound check appointment.  Your ICD is designed to protect you from life threatening heart rhythms. Because of this, you may receive a shock.   1 shock with no symptoms:  Call the office during business hours. 1 shock with symptoms (chest pain, chest pressure, dizziness, lightheadedness, shortness of breath, overall feeling unwell):  Call 911. If you experience 2 or more shocks in 24 hours:  Call 911. If you receive a shock, you should not drive.  [place] DMV - no driving for 6 months if you receive appropriate therapy from your ICD.   ICD Alerts:  Some alerts are vibratory and others beep. These are NOT emergencies. Please call our office to let us know. If this occurs at night or on weekends, it can wait until the next business day. Send a remote transmission.  If your device is capable of reading fluid status (for heart failure), you will be offered monthly monitoring to review this with you.   Remote monitoring is used to monitor your ICD from home. This monitoring is scheduled every 91 days by our office. It allows [place] to keep an eye on the functioning of your device to ensure it is working properly. You will routinely see your  Electrophysiologist annually (more often if necessary).

## 2023-07-27 DIAGNOSIS — Z7901 Long term (current) use of anticoagulants: Secondary | ICD-10-CM | POA: Diagnosis not present

## 2023-07-27 DIAGNOSIS — I482 Chronic atrial fibrillation, unspecified: Secondary | ICD-10-CM | POA: Diagnosis not present

## 2023-07-28 DIAGNOSIS — Z79899 Other long term (current) drug therapy: Secondary | ICD-10-CM | POA: Diagnosis not present

## 2023-07-28 DIAGNOSIS — I4891 Unspecified atrial fibrillation: Secondary | ICD-10-CM | POA: Diagnosis not present

## 2023-07-28 DIAGNOSIS — E7800 Pure hypercholesterolemia, unspecified: Secondary | ICD-10-CM | POA: Diagnosis not present

## 2023-07-28 DIAGNOSIS — Z7901 Long term (current) use of anticoagulants: Secondary | ICD-10-CM | POA: Diagnosis not present

## 2023-07-28 DIAGNOSIS — E1169 Type 2 diabetes mellitus with other specified complication: Secondary | ICD-10-CM | POA: Diagnosis not present

## 2023-07-28 DIAGNOSIS — I10 Essential (primary) hypertension: Secondary | ICD-10-CM | POA: Diagnosis not present

## 2023-07-30 ENCOUNTER — Ambulatory Visit: Payer: Medicare Other

## 2023-08-12 DIAGNOSIS — I482 Chronic atrial fibrillation, unspecified: Secondary | ICD-10-CM | POA: Diagnosis not present

## 2023-08-12 DIAGNOSIS — Z7901 Long term (current) use of anticoagulants: Secondary | ICD-10-CM | POA: Diagnosis not present

## 2023-08-28 DIAGNOSIS — Z79899 Other long term (current) drug therapy: Secondary | ICD-10-CM | POA: Diagnosis not present

## 2023-08-28 DIAGNOSIS — I4891 Unspecified atrial fibrillation: Secondary | ICD-10-CM | POA: Diagnosis not present

## 2023-08-28 DIAGNOSIS — J449 Chronic obstructive pulmonary disease, unspecified: Secondary | ICD-10-CM | POA: Diagnosis not present

## 2023-08-28 DIAGNOSIS — E7800 Pure hypercholesterolemia, unspecified: Secondary | ICD-10-CM | POA: Diagnosis not present

## 2023-08-28 DIAGNOSIS — E1169 Type 2 diabetes mellitus with other specified complication: Secondary | ICD-10-CM | POA: Diagnosis not present

## 2023-08-28 DIAGNOSIS — I10 Essential (primary) hypertension: Secondary | ICD-10-CM | POA: Diagnosis not present

## 2023-09-11 DIAGNOSIS — Z7901 Long term (current) use of anticoagulants: Secondary | ICD-10-CM | POA: Diagnosis not present

## 2023-09-11 DIAGNOSIS — I482 Chronic atrial fibrillation, unspecified: Secondary | ICD-10-CM | POA: Diagnosis not present

## 2023-09-14 ENCOUNTER — Other Ambulatory Visit (HOSPITAL_BASED_OUTPATIENT_CLINIC_OR_DEPARTMENT_OTHER): Payer: Self-pay

## 2023-09-14 MED ORDER — WARFARIN SODIUM 1 MG PO TABS
1.0000 mg | ORAL_TABLET | Freq: Every day | ORAL | 1 refills | Status: DC
  Filled 2023-11-03: qty 60, 60d supply, fill #0

## 2023-09-14 MED ORDER — FUROSEMIDE 80 MG PO TABS
ORAL_TABLET | ORAL | 0 refills | Status: DC
Start: 2023-09-14 — End: 2023-12-09
  Filled 2023-09-14: qty 270, 90d supply, fill #0

## 2023-09-14 MED ORDER — ALBUTEROL SULFATE HFA 108 (90 BASE) MCG/ACT IN AERS
2.0000 | INHALATION_SPRAY | RESPIRATORY_TRACT | 0 refills | Status: DC
Start: 2023-07-15 — End: 2024-02-15
  Filled 2024-02-01: qty 6.7, 25d supply, fill #0

## 2023-09-14 MED ORDER — POTASSIUM CHLORIDE ER 20 MEQ PO TBCR
20.0000 meq | EXTENDED_RELEASE_TABLET | Freq: Two times a day (BID) | ORAL | 1 refills | Status: DC
Start: 2023-09-09 — End: 2023-11-04

## 2023-09-15 ENCOUNTER — Other Ambulatory Visit (HOSPITAL_BASED_OUTPATIENT_CLINIC_OR_DEPARTMENT_OTHER): Payer: Self-pay

## 2023-09-15 ENCOUNTER — Other Ambulatory Visit: Payer: Self-pay

## 2023-09-15 MED ORDER — WARFARIN SODIUM 5 MG PO TABS
5.0000 mg | ORAL_TABLET | Freq: Every day | ORAL | 0 refills | Status: DC
Start: 2023-09-15 — End: 2024-02-01
  Filled 2023-09-15 – 2023-11-03 (×3): qty 90, 90d supply, fill #0

## 2023-09-15 MED ORDER — ROSUVASTATIN CALCIUM 20 MG PO TABS: 20.0000 mg | ORAL_TABLET | Freq: Every day | ORAL | 0 refills | Status: DC

## 2023-09-15 MED ORDER — ENALAPRIL MALEATE 5 MG PO TABS
2.5000 mg | ORAL_TABLET | Freq: Every day | ORAL | 0 refills | Status: DC
  Filled 2023-11-03: qty 45, 90d supply, fill #0

## 2023-09-15 MED ORDER — ROSUVASTATIN CALCIUM 20 MG PO TABS
20.0000 mg | ORAL_TABLET | Freq: Every day | ORAL | 0 refills | Status: DC
  Filled 2023-09-15: qty 90, fill #0

## 2023-09-15 MED ORDER — METFORMIN HCL ER 500 MG PO TB24
1000.0000 mg | ORAL_TABLET | Freq: Two times a day (BID) | ORAL | 0 refills | Status: DC
  Filled 2023-09-15 – 2023-11-18 (×2): qty 360, 90d supply, fill #0

## 2023-09-15 MED ORDER — METFORMIN HCL ER 500 MG PO TB24: 1000.0000 mg | ORAL_TABLET | Freq: Two times a day (BID) | ORAL | 0 refills | Status: DC

## 2023-09-15 MED ORDER — WARFARIN SODIUM 5 MG PO TABS: 5.0000 mg | ORAL_TABLET | Freq: Every day | ORAL | 0 refills | Status: DC

## 2023-09-22 ENCOUNTER — Other Ambulatory Visit (HOSPITAL_BASED_OUTPATIENT_CLINIC_OR_DEPARTMENT_OTHER): Payer: Self-pay

## 2023-09-22 MED FILL — Eplerenone Tab 25 MG: ORAL | 90 days supply | Qty: 90 | Fill #0 | Status: AC

## 2023-09-23 ENCOUNTER — Other Ambulatory Visit (HOSPITAL_BASED_OUTPATIENT_CLINIC_OR_DEPARTMENT_OTHER): Payer: Self-pay

## 2023-09-25 DIAGNOSIS — J449 Chronic obstructive pulmonary disease, unspecified: Secondary | ICD-10-CM | POA: Diagnosis not present

## 2023-09-25 DIAGNOSIS — Z79899 Other long term (current) drug therapy: Secondary | ICD-10-CM | POA: Diagnosis not present

## 2023-09-25 DIAGNOSIS — E7800 Pure hypercholesterolemia, unspecified: Secondary | ICD-10-CM | POA: Diagnosis not present

## 2023-09-25 DIAGNOSIS — E1169 Type 2 diabetes mellitus with other specified complication: Secondary | ICD-10-CM | POA: Diagnosis not present

## 2023-09-25 DIAGNOSIS — I482 Chronic atrial fibrillation, unspecified: Secondary | ICD-10-CM | POA: Diagnosis not present

## 2023-09-25 DIAGNOSIS — I10 Essential (primary) hypertension: Secondary | ICD-10-CM | POA: Diagnosis not present

## 2023-09-28 ENCOUNTER — Other Ambulatory Visit (HOSPITAL_BASED_OUTPATIENT_CLINIC_OR_DEPARTMENT_OTHER): Payer: Self-pay

## 2023-09-28 DIAGNOSIS — E7800 Pure hypercholesterolemia, unspecified: Secondary | ICD-10-CM | POA: Diagnosis not present

## 2023-09-28 DIAGNOSIS — E669 Obesity, unspecified: Secondary | ICD-10-CM | POA: Diagnosis not present

## 2023-09-28 DIAGNOSIS — J449 Chronic obstructive pulmonary disease, unspecified: Secondary | ICD-10-CM | POA: Diagnosis not present

## 2023-09-28 DIAGNOSIS — E1169 Type 2 diabetes mellitus with other specified complication: Secondary | ICD-10-CM | POA: Diagnosis not present

## 2023-09-28 DIAGNOSIS — I482 Chronic atrial fibrillation, unspecified: Secondary | ICD-10-CM | POA: Diagnosis not present

## 2023-09-28 DIAGNOSIS — Z6832 Body mass index (BMI) 32.0-32.9, adult: Secondary | ICD-10-CM | POA: Diagnosis not present

## 2023-09-28 DIAGNOSIS — I10 Essential (primary) hypertension: Secondary | ICD-10-CM | POA: Diagnosis not present

## 2023-09-28 DIAGNOSIS — J9611 Chronic respiratory failure with hypoxia: Secondary | ICD-10-CM | POA: Diagnosis not present

## 2023-09-28 MED ORDER — BUDESONIDE-FORMOTEROL FUMARATE 160-4.5 MCG/ACT IN AERO
2.0000 | INHALATION_SPRAY | Freq: Two times a day (BID) | RESPIRATORY_TRACT | 2 refills | Status: DC
Start: 2023-09-28 — End: 2024-01-04
  Filled 2023-09-28: qty 10.2, 30d supply, fill #0

## 2023-09-29 ENCOUNTER — Other Ambulatory Visit (HOSPITAL_BASED_OUTPATIENT_CLINIC_OR_DEPARTMENT_OTHER): Payer: Self-pay

## 2023-10-01 ENCOUNTER — Other Ambulatory Visit (HOSPITAL_BASED_OUTPATIENT_CLINIC_OR_DEPARTMENT_OTHER): Payer: Self-pay

## 2023-10-08 ENCOUNTER — Ambulatory Visit: Payer: Medicare Other

## 2023-10-08 DIAGNOSIS — I428 Other cardiomyopathies: Secondary | ICD-10-CM | POA: Diagnosis not present

## 2023-10-08 LAB — CUP PACEART REMOTE DEVICE CHECK
Battery Remaining Longevity: 103 mo
Battery Voltage: 3.01 V
Brady Statistic AP VP Percent: 0 %
Brady Statistic AP VS Percent: 0 %
Brady Statistic AS VP Percent: 99.21 %
Brady Statistic AS VS Percent: 0.79 %
Brady Statistic RA Percent Paced: INVALID
Brady Statistic RV Percent Paced: 99.21 %
Date Time Interrogation Session: 20250313005246
HighPow Impedance: 70 Ohm
Implantable Lead Connection Status: 753985
Implantable Lead Connection Status: 753985
Implantable Lead Implant Date: 20200103
Implantable Lead Implant Date: 20241211
Implantable Lead Location: 753858
Implantable Lead Location: 753860
Implantable Lead Model: 4398
Implantable Lead Model: 6935
Implantable Pulse Generator Implant Date: 20241211
Lead Channel Impedance Value: 3000 Ohm
Lead Channel Impedance Value: 323 Ohm
Lead Channel Impedance Value: 361 Ohm
Lead Channel Impedance Value: 380 Ohm
Lead Channel Impedance Value: 380 Ohm
Lead Channel Impedance Value: 418 Ohm
Lead Channel Impedance Value: 437 Ohm
Lead Channel Impedance Value: 475 Ohm
Lead Channel Impedance Value: 627 Ohm
Lead Channel Impedance Value: 627 Ohm
Lead Channel Impedance Value: 684 Ohm
Lead Channel Impedance Value: 722 Ohm
Lead Channel Impedance Value: 741 Ohm
Lead Channel Pacing Threshold Amplitude: 0.5 V
Lead Channel Pacing Threshold Amplitude: 1.5 V
Lead Channel Pacing Threshold Pulse Width: 0.4 ms
Lead Channel Pacing Threshold Pulse Width: 0.6 ms
Lead Channel Sensing Intrinsic Amplitude: 6.6 mV
Lead Channel Setting Pacing Amplitude: 2 V
Lead Channel Setting Pacing Amplitude: 2 V
Lead Channel Setting Pacing Pulse Width: 0.4 ms
Lead Channel Setting Pacing Pulse Width: 0.6 ms
Lead Channel Setting Sensing Sensitivity: 0.3 mV
Zone Setting Status: 755011

## 2023-10-13 ENCOUNTER — Encounter: Payer: Self-pay | Admitting: Internal Medicine

## 2023-10-13 ENCOUNTER — Ambulatory Visit: Payer: Medicare Other | Attending: Internal Medicine | Admitting: Internal Medicine

## 2023-10-13 VITALS — BP 98/62 | HR 68 | Ht 67.0 in | Wt 198.0 lb

## 2023-10-13 DIAGNOSIS — Z95810 Presence of automatic (implantable) cardiac defibrillator: Secondary | ICD-10-CM | POA: Diagnosis not present

## 2023-10-13 DIAGNOSIS — I480 Paroxysmal atrial fibrillation: Secondary | ICD-10-CM | POA: Diagnosis not present

## 2023-10-13 NOTE — Progress Notes (Signed)
 HPI He is a 73 yo man with persistent atrial fib and tachybrady s/p PPM insertion who developed worsening CHF symptoms associated with uncontrolled atrial fib and underwent AV node ablation. He has stopped smoking but since last being seen in our clinic, gained over 20 lbs.Today, he denies symptoms of palpitations, chest pain, shortness of breath,  lower extremity edema, dizziness, presyncope, or syncope.  He admits to being sedentary . He has undergone upgrade to a biv ICD.  Allergies  Allergen Reactions   Ivp Dye [Iodinated Contrast Media] Other (See Comments)    Constipation x 7 days     Current Outpatient Medications  Medication Sig Dispense Refill   albuterol (VENTOLIN HFA) 108 (90 Base) MCG/ACT inhaler Inhale 2 puffs into the lungs every 4-6 hours as needed 6.7 g 0   budesonide-formoterol (SYMBICORT) 160-4.5 MCG/ACT inhaler Inhale 2 puffs into the lungs 2 times per day in the morning and evening 10.2 g 2   Cyanocobalamin (B-12) 1000 MCG TABS Take 1,000 mcg by mouth daily.     diphenhydrAMINE (BENADRYL) 25 mg capsule Take 25 mg by mouth 2 (two) times daily.     enalapril (VASOTEC) 5 MG tablet Take 0.5 tablets (2.5 mg total) by mouth daily. 45 tablet 0   eplerenone (INSPRA) 25 MG tablet Take 1 tablet (25 mg total) by mouth daily. 90 tablet 3   ferrous sulfate 324 MG TBEC Take 324 mg by mouth daily.     furosemide (LASIX) 80 MG tablet Take 2 tablets (160 mg total) by mouth every morning AND 1 tablet (80 mg total) at bedtime. 270 tablet 0   metFORMIN (GLUCOPHAGE-XR) 500 MG 24 hr tablet Take 2 tablets (1,000 mg total) by mouth 2 (two) times daily. 360 tablet 0   metoprolol succinate (TOPROL-XL) 25 MG 24 hr tablet TAKE 1 TABLET BY MOUTH ONCE DAILY WITH FOOD 90 tablet 3   OXYGEN Inhale 3 L into the lungs as needed.     Potassium Chloride ER 20 MEQ TBCR Take 1 tablet (20 mEq total) by mouth 2 (two) times daily with food 180 tablet 1   potassium chloride SA (K-DUR,KLOR-CON) 20 MEQ  tablet Take 20 mEq by mouth 2 (two) times daily.     rosuvastatin (CRESTOR) 20 MG tablet Take 1 tablet (20 mg total) by mouth daily. 90 tablet 0   Tiotropium Bromide Monohydrate (SPIRIVA HANDIHALER IN) Inhale into the lungs.     TURMERIC EXTRA STRENGTH PO Take 1 tablet by mouth daily.     warfarin (COUMADIN) 1 MG tablet Take 1 tablet (1 mg total) by mouth daily. (Patient taking differently: Take 1 mg by mouth daily. 1 tablet daily along with 5 mg tab) 60 tablet 1   warfarin (COUMADIN) 5 MG tablet Take 5 mg by mouth daily at 8 pm. Patient takes an additional 1mg  tablet daily     warfarin (COUMADIN) 5 MG tablet TAKE ONE TABLET BY MOUTH EVERY DAY (Patient taking differently: Take 5 mg by mouth daily. Along with 1 mg daily) 90 tablet 0   warfarin (COUMADIN) 1 MG tablet Take 1 mg by mouth See admin instructions. Take 1mg  (one tablet) on Thursdays, Saturdays, and Sundays. To be taken in addition to 5mg  tablet that is taken daily. (Patient not taking: Reported on 10/13/2023)     No current facility-administered medications for this visit.     Past Medical History:  Diagnosis Date   Alcohol abuse    Arthritis    Atrial  fibrillation (HCC) 08/02/2018   CHF (congestive heart failure) (HCC) 03/12/2017   Chronic atrial fibrillation (HCC) 03/12/2017   Chronic respiratory failure (HCC)    COPD (chronic obstructive pulmonary disease) (HCC) 03/12/2017   Dyspnea    wears Oxygen 24/7 3 L   Essential hypertension 03/12/2017   High risk medication use 05/05/2017   Hyperlipidemia 03/12/2017   Hypertensive heart disease with heart failure (HCC) 03/12/2017   ICD (implantable cardioverter-defibrillator) in place 07/30/2018   Ischemic cardiomyopathy 03/12/2017   Long term (current) use of anticoagulants 03/12/2017   Peripheral vascular disease (HCC) 03/12/2017   Preoperative cardiovascular examination 03/25/2015   Presence of combination internal cardiac defibrillator (ICD) and pacemaker 03/12/2017   unable to place a CRT     Type 2 diabetes mellitus without complications (HCC) 03/12/2017    ROS:   All systems reviewed and negative except as noted in the HPI.   Past Surgical History:  Procedure Laterality Date   AV NODE ABLATION N/A 08/02/2018   Procedure: AV NODE ABLATION;  Surgeon: Marinus Maw, MD;  Location: [facility] INVASIVE CV LAB;  Service: Cardiovascular;  Laterality: N/A;   BIV ICD GENERATOR CHANGEOUT N/A 07/08/2023   Procedure: BIV ICD GENERATOR CHANGEOUT;  Surgeon: Marinus Maw, MD;  Location: [facility] INVASIVE CV LAB;  Service: Cardiovascular;  Laterality: N/A;   BIV ICD INSERTION CRT-D N/A 07/30/2018   Procedure: BIV ICD INSERTION CRT-D;  Surgeon: Marinus Maw, MD;  Location: [facility] OR;  Service: Cardiovascular;  Laterality: N/A;   BIV PACEMAKER GENERATOR CHANGEOUT N/A 05/27/2018   Procedure: BIV PACEMAKER GENERATOR UPGRADE - Aborted;  Surgeon: Regan Lemming, MD;  Location: [facility] INVASIVE CV LAB;  Service: Cardiovascular;  Laterality: N/A;   CHOLECYSTECTOMY     COLONOSCOPY     ERCP     stone removal   EXCISION OF BREAST BIOPSY     Age 23, benign   LEAD REVISION/REPAIR N/A 07/08/2023   Procedure: LEAD REVISION/REPAIR;  Surgeon: Marinus Maw, MD;  Location: [facility] INVASIVE CV LAB;  Service: Cardiovascular;  Laterality: N/A;   PACEMAKER LEAD REMOVAL N/A 07/30/2018   Procedure: PACEMAKER LEAD REMOVAL;  Surgeon: Marinus Maw, MD;  Location: [facility] OR;  Service: Cardiovascular;  Laterality: N/A;     Family History  Problem Relation Age of Onset   Breast cancer Sister    Alcohol abuse Brother    Alcohol abuse Brother      Social History   Socioeconomic History   Marital status: Divorced    Spouse name: Not on file   Number of children: Not on file   Years of education: Not on file   Highest education level: Not on file  Occupational History   Not on file  Tobacco Use   Smoking status: Former    Current packs/day: 0.00    Average packs/day: 1 pack/day for 61.0 years (61.0 ttl pk-yrs)     Types: Cigarettes    Start date: 08/1957    Quit date: 08/2018    Years since quitting: 5.1   Smokeless tobacco: Never  Vaping Use   Vaping status: Never Used  Substance and Sexual Activity   Alcohol use: No   Drug use: No   Sexual activity: Not on file  Other Topics Concern   Not on file  Social History Narrative   Not on file   Social Drivers of Health   Financial Resource Strain: Not on file  Food Insecurity: No Food Insecurity (07/08/2023)   Hunger Vital Sign  Worried About Programme researcher, broadcasting/film/video in the Last Year: Never true    Ran Out of Food in the Last Year: Never true  Transportation Needs: No Transportation Needs (07/08/2023)   PRAPARE - Administrator, Civil Service (Medical): No    Lack of Transportation (Non-Medical): No  Physical Activity: Not on file  Stress: Not on file  Social Connections: Not on file  Intimate Partner Violence: Not At Risk (07/08/2023)   Humiliation, Afraid, Rape, and Kick questionnaire    Fear of Current or Ex-Partner: No    Emotionally Abused: No    Physically Abused: No    Sexually Abused: No     BP 98/62   Pulse 68   Ht 5\' 7"  (1.702 m)   Wt 198 lb (89.8 kg)   SpO2 96%   BMI 31.01 kg/m   Physical Exam:  Well appearing NAD HEENT: Unremarkable Neck:  No JVD, no thyromegally; scar right IJ Lymphatics:  No adenopathy Back:  No CVA tenderness Lungs:  Clear with no wheezes HEART:  Regular rate rhythm, no murmurs, no rubs, no clicks Abd:  soft, positive bowel sounds, no organomegally, no rebound, no guarding Ext:  2 plus pulses, no edema, no cyanosis, no clubbing Skin:  No rashes no nodules Neuro:  CN II through XII intact, motor grossly intact  EKG - atrial fib with ventricular pacing  DEVICE  Normal device function.  See PaceArt for details.   Assess/Plan:  Atrial fib - his VR is well controlled s/p AV node ablation. He will continue his current meds. 2. Coags - he will continue warfarin 3. HTN -his bp is  well controlled. 4. Dyslipidemia - he will continue simvastatin. 5. ICD - his biv ICD is working normally.    Sharlot Gowda Floretta Petro,MD

## 2023-10-13 NOTE — Patient Instructions (Signed)
Medication Instructions:  Your physician recommends that you continue on your current medications as directed. Please refer to the Current Medication list given to you today. *If you need a refill on your cardiac medications before your next appointment, please call your pharmacy   Follow-Up: At [facility], you and your health needs are our priority.  As part of our continuing mission to provide you with exceptional heart care, we have created designated Provider Care Teams.  These Care Teams include your primary Cardiologist (physician) and Advanced Practice Providers (APPs -  Physician Assistants and Nurse Practitioners) who all work together to provide you with the care you need, when you need it.  We recommend signing up for the patient portal called "MyChart".  Sign up information is provided on this After Visit Summary.  MyChart is used to connect with patients for Virtual Visits (Telemedicine).  Patients are able to view lab/test results, encounter notes, upcoming appointments, etc.  Non-urgent messages can be sent to your provider as well.   To learn more about what you can do with MyChart, go to [email or web address].    Your next appointment:   1 year(s)  Provider:   Gregg Taylor, MD   

## 2023-10-18 LAB — CUP PACEART INCLINIC DEVICE CHECK
Date Time Interrogation Session: 20250318132508
Implantable Lead Connection Status: 753985
Implantable Lead Connection Status: 753985
Implantable Lead Implant Date: 20200103
Implantable Lead Implant Date: 20241211
Implantable Lead Location: 753858
Implantable Lead Location: 753860
Implantable Lead Model: 4398
Implantable Lead Model: 6935
Implantable Pulse Generator Implant Date: 20241211

## 2023-10-25 NOTE — Progress Notes (Unsigned)
 Cardiology Office Note:  .   Date:  10/26/2023  ID:  Jaime Scott, DOB 07/02/1951, MRN [id number] PCP: Alinda Deem, MD  [facility] Providers Cardiologist:  Norman Herrlich, MD Electrophysiologist:  [name], MD    History of Present Illness: .   Jaime Scott is a 73 y.o. male with a past medical history of HFrEF, presence of ICD, atrial fibrillation on warfarin, hypertension, LBBB, PVD, COPD on O2 3 lpm, DM 2, history of alcohol abuse, dyslipidemia, tobacco abuse.  10/18/2023 device chest normal 07/08/2023 implantation of biventricular ICD 10/07/2021 echo EF 45 to 50%, indeterminate diastolic parameters, LA moderately dilated, mild MR, moderate dilatation of the ascending order 41 mm 08/02/2018 AV node ablation  Evaluated by Dr. Dulce Sellar on 04/23/2023, his recent device check and revealed biventricular pacing at 71%, he was overall stable no changes from a general cardiology perspective and he was advised to follow-up in 6 months.  Most recently evaluated by Dr. Ladona Ridgel on 10/13/2023, he was stable, had recently stopped smoking and unfortunately had gained 20 pounds, he was advised to follow-up with EP in 1 year.  He presents today for follow-up of his heart failure and atrial fibrillation.  He says he has been doing well since he most recently got out of the hospital but states he will not return to [facility] in the future.  When he saw Dr. Ladona Ridgel a few weeks ago he had stopped smoking however since then he has resumed smoking with no intentions of quitting at this time.  He says he is fine to all questions that are asked of him.  He is on oxygen 3 L/day continuously that is managed by his PCP. He denies chest pain, palpitations, dyspnea, pnd, orthopnea, n, v, dizziness, syncope, edema, weight gain, or early satiety.   ROS: Review of Systems  All other systems reviewed and are negative.    Studies Reviewed: .        Cardiac Studies & Procedures    ______________________________________________________________________________________________   STRESS TESTS  MYOCARDIAL PERFUSION IMAGING 03/25/2017   ECHOCARDIOGRAM  ECHOCARDIOGRAM COMPLETE 10/07/2021  Narrative ECHOCARDIOGRAM REPORT    Patient Name:   Jaime Scott Date of Exam: 10/07/2021 Medical Rec #:  [id number]         Height:       67.0 in Accession #:    [id number]        Weight:       229.2 lb Date of Birth:  Jun 09, 1951         BSA:          2.143 m Patient Age:    73 years          BP:           92/58 mmHg Patient Gender: M                 HR:           83 bpm. Exam Location:  [place]  Procedure: 2D Echo, Cardiac Doppler, Color Doppler and Strain Analysis  Indications:    CHF-Acute Systolic I50.21  History:        Patient has no prior history of Echocardiogram examinations. Dilated cardiomyopathy, Pacemaker, COPD and PAD, Arrythmias:Atrial Fibrillation and LBBB, Signs/Symptoms:Dyspnea; Risk Factors:Dyslipidemia and Hypertension.  Sonographer:    Louie Boston RDCS Referring Phys: [id number] Mariam Dollar [facility]  IMPRESSIONS   1. GLS -9.9. Left ventricular ejection fraction, by estimation, is 45 to 50%. The left ventricle has  mildly decreased function. The left ventricle has no regional wall motion abnormalities. The left ventricular internal cavity size was mildly dilated. Left ventricular diastolic parameters are indeterminate. 2. Right ventricular systolic function is normal. The right ventricular size is normal. There is normal pulmonary artery systolic pressure. 3. Left atrial size was moderately dilated. 4. Right atrial size was mildly dilated. 5. The mitral valve is normal in structure. Mild mitral valve regurgitation. No evidence of mitral stenosis. 6. The aortic valve is normal in structure. Aortic valve regurgitation is mild. No aortic stenosis is present. 7. There is moderate dilatation of the ascending aorta, measuring 41 mm. 8. The inferior vena  cava is normal in size with greater than 50% respiratory variability, suggesting right atrial pressure of 3 mmHg.  FINDINGS Left Ventricle: GLS -9.9. Left ventricular ejection fraction, by estimation, is 45 to 50%. The left ventricle has mildly decreased function. The left ventricle has no regional wall motion abnormalities. The left ventricular internal cavity size was mildly dilated. There is no left ventricular hypertrophy. Abnormal (paradoxical) septal motion, consistent with RV pacemaker. Left ventricular diastolic parameters are indeterminate.  Right Ventricle: The right ventricular size is normal. No increase in right ventricular wall thickness. Right ventricular systolic function is normal. There is normal pulmonary artery systolic pressure. The tricuspid regurgitant velocity is 2.43 m/s, and with an assumed right atrial pressure of 8 mmHg, the estimated right ventricular systolic pressure is 31.6 mmHg.  Left Atrium: Left atrial size was moderately dilated.  Right Atrium: Right atrial size was mildly dilated.  Pericardium: There is no evidence of pericardial effusion.  Mitral Valve: The mitral valve is normal in structure. Mild mitral valve regurgitation. No evidence of mitral valve stenosis.  Tricuspid Valve: The tricuspid valve is normal in structure. Tricuspid valve regurgitation is mild . No evidence of tricuspid stenosis.  Aortic Valve: The aortic valve is normal in structure. Aortic valve regurgitation is mild. Aortic regurgitation PHT measures 660 msec. No aortic stenosis is present.  Pulmonic Valve: The pulmonic valve was normal in structure. Pulmonic valve regurgitation is not visualized. No evidence of pulmonic stenosis.  Aorta: The aortic root is normal in size and structure. There is moderate dilatation of the ascending aorta, measuring 41 mm.  Venous: The inferior vena cava is normal in size with greater than 50% respiratory variability, suggesting right atrial pressure  of 3 mmHg.  IAS/Shunts: No atrial level shunt detected by color flow Doppler.  Additional Comments: A device lead is visualized.   LEFT VENTRICLE PLAX 2D LVIDd:         5.90 cm      Diastology LVIDs:         4.50 cm      LV e' medial:    7.94 cm/s LV PW:         1.10 cm      LV E/e' medial:  12.0 LV IVS:        1.00 cm      LV e' lateral:   12.00 cm/s LVOT diam:     2.00 cm      LV E/e' lateral: 8.0 LV SV:         56 LV SV Index:   26 LVOT Area:     3.14 cm  LV Volumes (MOD) LV vol d, MOD A2C: 108.0 ml LV vol d, MOD A4C: 113.0 ml LV vol s, MOD A2C: 54.1 ml LV vol s, MOD A4C: 63.7 ml LV SV MOD A2C:  53.9 ml LV SV MOD A4C:     113.0 ml LV SV MOD BP:      52.5 ml  RIGHT VENTRICLE            IVC RV S prime:     9.57 cm/s  IVC diam: 2.60 cm TAPSE (M-mode): 1.9 cm  LEFT ATRIUM              Index        RIGHT ATRIUM           Index LA diam:        4.70 cm  2.19 cm/m   RA Area:     21.80 cm LA Vol (A2C):   110.0 ml 51.33 ml/m  RA Volume:   72.70 ml  33.93 ml/m LA Vol (A4C):   101.0 ml 47.13 ml/m LA Biplane Vol: 110.0 ml 51.33 ml/m AORTIC VALVE LVOT Vmax:   103.00 cm/s LVOT Vmean:  66.600 cm/s LVOT VTI:    0.177 m AI PHT:      660 msec  AORTA Ao Root diam: 3.60 cm Ao Asc diam:  4.10 cm  MITRAL VALVE               TRICUSPID VALVE MV Area (PHT): 3.53 cm    TR Peak grad:   23.6 mmHg MV Decel Time: 215 msec    TR Vmax:        243.00 cm/s MV E velocity: 95.50 cm/s MV A velocity: 25.10 cm/s  SHUNTS MV E/A ratio:  3.80        Systemic VTI:  0.18 m Systemic Diam: 2.00 cm  Gypsy Balsam MD Electronically signed by Gypsy Balsam MD Signature Date/Time: 10/07/2021/12:32:46 PM    Final          ______________________________________________________________________________________________      Risk Assessment/Calculations:    CHA2DS2-VASc Score = 5   This indicates a 7.2% annual risk of stroke. The patient's score is based upon: CHF History: 1 HTN  History: 1 Diabetes History: 1 Stroke History: 0 Vascular Disease History: 1 Age Score: 1 Gender Score: 0            Physical Exam:   VS:  BP (!) 100/58   Pulse 68   Ht 5\' 7"  (1.702 m)   Wt 198 lb (89.8 kg)   SpO2 94%   BMI 31.01 kg/m    Wt Readings from Last 3 Encounters:  10/26/23 198 lb (89.8 kg)  10/13/23 198 lb (89.8 kg)  07/21/23 197 lb (89.4 kg)    GEN: no acute distress, chronically-ill appearing,  nicotine staining noted on nails NECK: No JVD; No carotid bruits CARDIAC: Irregularly irregular, no murmurs, rubs, gallops RESPIRATORY: Lung sounds are distant ABDOMEN: Soft, non-tender, non-distended EXTREMITIES:  No edema; No deformity   ASSESSMENT AND PLAN: .   Chronic atrial fibrillation/hypercoagulable state-CHA2DS2-VASc score of 5, he is on warfarin which is managed by his PCP.  HFrEF/biventricular ICD-follows with the EP.  NYHA class II, euvolemic.  Continue enalapril 2.5 mg daily, continue eplerenone 25 mg daily, continue Lasix 160 mg in the morning and 80 mg in the evening, continue metoprolol 25 mg daily.  Most recent device check revealed his device was functioning appropriately.  Dyslipidemia-this appears to be followed formally by his PCP, currently on Crestor 20 mg daily.  COPD on oxygen-managed by his PCP, on oxygen continuous.  Tobacco abuse-he briefly stopped but he Crista Curb resumed smoking approximately a pack per day, not interested in cessation at  this time.       Dispo: Follow up with Dr. Dulce Sellar in 6 months.   Signed, Flossie Dibble, NP

## 2023-10-26 ENCOUNTER — Encounter: Payer: Self-pay | Admitting: Cardiology

## 2023-10-26 ENCOUNTER — Other Ambulatory Visit (HOSPITAL_BASED_OUTPATIENT_CLINIC_OR_DEPARTMENT_OTHER): Payer: Self-pay

## 2023-10-26 ENCOUNTER — Ambulatory Visit: Attending: Cardiology | Admitting: Cardiology

## 2023-10-26 VITALS — BP 100/58 | HR 68 | Ht 67.0 in | Wt 198.0 lb

## 2023-10-26 DIAGNOSIS — I428 Other cardiomyopathies: Secondary | ICD-10-CM

## 2023-10-26 DIAGNOSIS — Z95810 Presence of automatic (implantable) cardiac defibrillator: Secondary | ICD-10-CM | POA: Diagnosis not present

## 2023-10-26 DIAGNOSIS — Z79899 Other long term (current) drug therapy: Secondary | ICD-10-CM | POA: Diagnosis not present

## 2023-10-26 DIAGNOSIS — I482 Chronic atrial fibrillation, unspecified: Secondary | ICD-10-CM

## 2023-10-26 DIAGNOSIS — I10 Essential (primary) hypertension: Secondary | ICD-10-CM | POA: Diagnosis not present

## 2023-10-26 DIAGNOSIS — Z7901 Long term (current) use of anticoagulants: Secondary | ICD-10-CM

## 2023-10-26 DIAGNOSIS — E1169 Type 2 diabetes mellitus with other specified complication: Secondary | ICD-10-CM | POA: Diagnosis not present

## 2023-10-26 DIAGNOSIS — E7800 Pure hypercholesterolemia, unspecified: Secondary | ICD-10-CM | POA: Diagnosis not present

## 2023-10-26 DIAGNOSIS — J449 Chronic obstructive pulmonary disease, unspecified: Secondary | ICD-10-CM | POA: Diagnosis not present

## 2023-10-26 DIAGNOSIS — Z720 Tobacco use: Secondary | ICD-10-CM | POA: Diagnosis not present

## 2023-10-26 MED ORDER — ROSUVASTATIN CALCIUM 20 MG PO TABS
20.0000 mg | ORAL_TABLET | Freq: Every day | ORAL | 3 refills | Status: AC
  Filled 2023-10-26: qty 90, 90d supply, fill #0
  Filled 2023-11-03: qty 90, 90d supply, fill #1

## 2023-10-26 NOTE — Patient Instructions (Signed)
 Medication Instructions:  Your physician recommends that you continue on your current medications as directed. Please refer to the Current Medication list given to you today.  *If you need a refill on your cardiac medications before your next appointment, please call your pharmacy*   Lab Work: None Ordered If you have labs (blood work) drawn today and your tests are completely normal, you will receive your results only by: MyChart Message (if you have MyChart) OR A paper copy in the mail If you have any lab test that is abnormal or we need to change your treatment, we will call you to review the results.   Testing/Procedures: None Ordered   Follow-Up: At [facility], you and your health needs are our priority.  As part of our continuing mission to provide you with exceptional heart care, we have created designated Provider Care Teams.  These Care Teams include your primary Cardiologist (physician) and Advanced Practice Providers (APPs -  Physician Assistants and Nurse Practitioners) who all work together to provide you with the care you need, when you need it.  We recommend signing up for the patient portal called "MyChart".  Sign up information is provided on this After Visit Summary.  MyChart is used to connect with patients for Virtual Visits (Telemedicine).  Patients are able to view lab/test results, encounter notes, upcoming appointments, etc.  Non-urgent messages can be sent to your provider as well.   To learn more about what you can do with MyChart, go to [email or web address].    Your next appointment:   6 month follow up

## 2023-10-29 ENCOUNTER — Other Ambulatory Visit (HOSPITAL_BASED_OUTPATIENT_CLINIC_OR_DEPARTMENT_OTHER): Payer: Self-pay

## 2023-10-29 DIAGNOSIS — I482 Chronic atrial fibrillation, unspecified: Secondary | ICD-10-CM | POA: Diagnosis not present

## 2023-10-29 DIAGNOSIS — Z7901 Long term (current) use of anticoagulants: Secondary | ICD-10-CM | POA: Diagnosis not present

## 2023-11-03 ENCOUNTER — Other Ambulatory Visit (HOSPITAL_BASED_OUTPATIENT_CLINIC_OR_DEPARTMENT_OTHER): Payer: Self-pay

## 2023-11-03 MED FILL — Eplerenone Tab 25 MG: ORAL | 90 days supply | Qty: 90 | Fill #1 | Status: AC

## 2023-11-04 ENCOUNTER — Other Ambulatory Visit (HOSPITAL_BASED_OUTPATIENT_CLINIC_OR_DEPARTMENT_OTHER): Payer: Self-pay

## 2023-11-04 ENCOUNTER — Ambulatory Visit: Payer: Medicare Other | Admitting: Cardiology

## 2023-11-04 MED ORDER — POTASSIUM CHLORIDE ER 20 MEQ PO TBCR
20.0000 meq | EXTENDED_RELEASE_TABLET | Freq: Two times a day (BID) | ORAL | 1 refills | Status: AC
  Filled 2023-11-04: qty 180, 90d supply, fill #0
  Filled 2024-01-18: qty 180, 90d supply, fill #1

## 2023-11-05 ENCOUNTER — Other Ambulatory Visit (HOSPITAL_BASED_OUTPATIENT_CLINIC_OR_DEPARTMENT_OTHER): Payer: Self-pay

## 2023-11-18 ENCOUNTER — Other Ambulatory Visit (HOSPITAL_BASED_OUTPATIENT_CLINIC_OR_DEPARTMENT_OTHER): Payer: Self-pay

## 2023-11-19 ENCOUNTER — Other Ambulatory Visit (HOSPITAL_BASED_OUTPATIENT_CLINIC_OR_DEPARTMENT_OTHER): Payer: Self-pay

## 2023-11-19 NOTE — Progress Notes (Signed)
 Remote ICD transmission.

## 2023-11-25 DIAGNOSIS — I10 Essential (primary) hypertension: Secondary | ICD-10-CM | POA: Diagnosis not present

## 2023-11-25 DIAGNOSIS — J449 Chronic obstructive pulmonary disease, unspecified: Secondary | ICD-10-CM | POA: Diagnosis not present

## 2023-11-25 DIAGNOSIS — Z79899 Other long term (current) drug therapy: Secondary | ICD-10-CM | POA: Diagnosis not present

## 2023-11-25 DIAGNOSIS — I482 Chronic atrial fibrillation, unspecified: Secondary | ICD-10-CM | POA: Diagnosis not present

## 2023-11-25 DIAGNOSIS — Z7901 Long term (current) use of anticoagulants: Secondary | ICD-10-CM | POA: Diagnosis not present

## 2023-11-25 DIAGNOSIS — E1169 Type 2 diabetes mellitus with other specified complication: Secondary | ICD-10-CM | POA: Diagnosis not present

## 2023-11-25 DIAGNOSIS — E7800 Pure hypercholesterolemia, unspecified: Secondary | ICD-10-CM | POA: Diagnosis not present

## 2023-11-26 DIAGNOSIS — Z7901 Long term (current) use of anticoagulants: Secondary | ICD-10-CM | POA: Diagnosis not present

## 2023-12-09 ENCOUNTER — Other Ambulatory Visit (HOSPITAL_BASED_OUTPATIENT_CLINIC_OR_DEPARTMENT_OTHER): Payer: Self-pay

## 2023-12-09 ENCOUNTER — Telehealth: Payer: Self-pay | Admitting: Cardiology

## 2023-12-09 MED ORDER — METOPROLOL SUCCINATE ER 25 MG PO TB24
25.0000 mg | ORAL_TABLET | Freq: Every day | ORAL | 3 refills | Status: AC
  Filled 2023-12-09: qty 90, 90d supply, fill #0
  Filled 2024-03-21: qty 90, 90d supply, fill #1
  Filled 2024-08-29: qty 90, 90d supply, fill #2

## 2023-12-09 MED ORDER — FUROSEMIDE 80 MG PO TABS
ORAL_TABLET | ORAL | 0 refills | Status: DC
  Filled 2023-12-09: qty 270, 90d supply, fill #0

## 2023-12-09 NOTE — Telephone Encounter (Signed)
*  STAT* If patient is at the pharmacy, call can be transferred to refill team.   1. Which medications need to be refilled? (please list name of each medication and dose if known)   metoprolol  succinate (TOPROL -XL) 25 MG 24 hr tablet   2. Would you like to learn more about the convenience, safety, & potential cost savings by using the [facility]?   3. Are you open to using the [facility] (Type [facility]. ).  4. Which pharmacy/location (including street and city if local pharmacy) is medication to be sent to?  [facility]   5. Do they need a 30 day or 90 day supply?   90 day  Caller [facility]) stated patient is completely out of this medication.

## 2023-12-09 NOTE — Telephone Encounter (Signed)
 RX sent to requested Pharmacy

## 2023-12-26 DIAGNOSIS — J449 Chronic obstructive pulmonary disease, unspecified: Secondary | ICD-10-CM | POA: Diagnosis not present

## 2023-12-26 DIAGNOSIS — J9611 Chronic respiratory failure with hypoxia: Secondary | ICD-10-CM | POA: Diagnosis not present

## 2023-12-26 DIAGNOSIS — E1169 Type 2 diabetes mellitus with other specified complication: Secondary | ICD-10-CM | POA: Diagnosis not present

## 2023-12-26 DIAGNOSIS — Z79899 Other long term (current) drug therapy: Secondary | ICD-10-CM | POA: Diagnosis not present

## 2023-12-26 DIAGNOSIS — I482 Chronic atrial fibrillation, unspecified: Secondary | ICD-10-CM | POA: Diagnosis not present

## 2023-12-26 DIAGNOSIS — I5022 Chronic systolic (congestive) heart failure: Secondary | ICD-10-CM | POA: Diagnosis not present

## 2023-12-26 DIAGNOSIS — E7800 Pure hypercholesterolemia, unspecified: Secondary | ICD-10-CM | POA: Diagnosis not present

## 2023-12-26 DIAGNOSIS — I10 Essential (primary) hypertension: Secondary | ICD-10-CM | POA: Diagnosis not present

## 2023-12-26 DIAGNOSIS — Z7901 Long term (current) use of anticoagulants: Secondary | ICD-10-CM | POA: Diagnosis not present

## 2024-01-04 ENCOUNTER — Other Ambulatory Visit (HOSPITAL_BASED_OUTPATIENT_CLINIC_OR_DEPARTMENT_OTHER): Payer: Self-pay

## 2024-01-04 DIAGNOSIS — E1169 Type 2 diabetes mellitus with other specified complication: Secondary | ICD-10-CM | POA: Diagnosis not present

## 2024-01-04 DIAGNOSIS — E7800 Pure hypercholesterolemia, unspecified: Secondary | ICD-10-CM | POA: Diagnosis not present

## 2024-01-04 DIAGNOSIS — J449 Chronic obstructive pulmonary disease, unspecified: Secondary | ICD-10-CM | POA: Diagnosis not present

## 2024-01-04 DIAGNOSIS — J9611 Chronic respiratory failure with hypoxia: Secondary | ICD-10-CM | POA: Diagnosis not present

## 2024-01-04 DIAGNOSIS — D519 Vitamin B12 deficiency anemia, unspecified: Secondary | ICD-10-CM | POA: Diagnosis not present

## 2024-01-04 DIAGNOSIS — I10 Essential (primary) hypertension: Secondary | ICD-10-CM | POA: Diagnosis not present

## 2024-01-04 DIAGNOSIS — I482 Chronic atrial fibrillation, unspecified: Secondary | ICD-10-CM | POA: Diagnosis not present

## 2024-01-04 DIAGNOSIS — E669 Obesity, unspecified: Secondary | ICD-10-CM | POA: Diagnosis not present

## 2024-01-04 DIAGNOSIS — Z6831 Body mass index (BMI) 31.0-31.9, adult: Secondary | ICD-10-CM | POA: Diagnosis not present

## 2024-01-04 MED ORDER — WARFARIN SODIUM 1 MG PO TABS
1.0000 mg | ORAL_TABLET | Freq: Every day | ORAL | 1 refills | Status: DC
  Filled 2024-01-04: qty 60, 60d supply, fill #0
  Filled 2024-02-29: qty 60, 60d supply, fill #1

## 2024-01-04 MED ORDER — ALBUTEROL SULFATE HFA 108 (90 BASE) MCG/ACT IN AERS
INHALATION_SPRAY | RESPIRATORY_TRACT | 0 refills | Status: DC
  Filled 2024-01-04: qty 6.7, 17d supply, fill #0

## 2024-01-05 ENCOUNTER — Other Ambulatory Visit (HOSPITAL_BASED_OUTPATIENT_CLINIC_OR_DEPARTMENT_OTHER): Payer: Self-pay

## 2024-01-07 ENCOUNTER — Ambulatory Visit (INDEPENDENT_AMBULATORY_CARE_PROVIDER_SITE_OTHER): Payer: Medicare Other

## 2024-01-07 DIAGNOSIS — I428 Other cardiomyopathies: Secondary | ICD-10-CM

## 2024-01-07 LAB — CUP PACEART REMOTE DEVICE CHECK
Battery Remaining Longevity: 88 mo
Battery Voltage: 3 V
Brady Statistic AP VP Percent: 0 %
Brady Statistic AP VS Percent: 0 %
Brady Statistic AS VP Percent: 99.67 %
Brady Statistic AS VS Percent: 0.33 %
Brady Statistic RA Percent Paced: INVALID
Brady Statistic RV Percent Paced: 99.67 %
Date Time Interrogation Session: 20250611215616
HighPow Impedance: 60 Ohm
Implantable Lead Connection Status: 753985
Implantable Lead Connection Status: 753985
Implantable Lead Implant Date: 20200103
Implantable Lead Implant Date: 20241211
Implantable Lead Location: 753858
Implantable Lead Location: 753860
Implantable Lead Model: 4398
Implantable Lead Model: 6935
Implantable Pulse Generator Implant Date: 20241211
Lead Channel Impedance Value: 3000 Ohm
Lead Channel Impedance Value: 323 Ohm
Lead Channel Impedance Value: 323 Ohm
Lead Channel Impedance Value: 361 Ohm
Lead Channel Impedance Value: 418 Ohm
Lead Channel Impedance Value: 437 Ohm
Lead Channel Impedance Value: 456 Ohm
Lead Channel Impedance Value: 456 Ohm
Lead Channel Impedance Value: 589 Ohm
Lead Channel Impedance Value: 627 Ohm
Lead Channel Impedance Value: 722 Ohm
Lead Channel Impedance Value: 741 Ohm
Lead Channel Impedance Value: 760 Ohm
Lead Channel Pacing Threshold Amplitude: 0.5 V
Lead Channel Pacing Threshold Amplitude: 1.75 V
Lead Channel Pacing Threshold Pulse Width: 0.4 ms
Lead Channel Pacing Threshold Pulse Width: 0.6 ms
Lead Channel Sensing Intrinsic Amplitude: 19.5 mV
Lead Channel Setting Pacing Amplitude: 2 V
Lead Channel Setting Pacing Amplitude: 2.5 V
Lead Channel Setting Pacing Pulse Width: 0.4 ms
Lead Channel Setting Pacing Pulse Width: 0.6 ms
Lead Channel Setting Sensing Sensitivity: 0.3 mV
Zone Setting Status: 755011

## 2024-01-10 ENCOUNTER — Ambulatory Visit: Payer: Self-pay | Admitting: Internal Medicine

## 2024-01-18 ENCOUNTER — Other Ambulatory Visit (HOSPITAL_BASED_OUTPATIENT_CLINIC_OR_DEPARTMENT_OTHER): Payer: Self-pay

## 2024-01-20 ENCOUNTER — Telehealth: Payer: Self-pay | Admitting: Cardiology

## 2024-01-20 ENCOUNTER — Ambulatory Visit (INDEPENDENT_AMBULATORY_CARE_PROVIDER_SITE_OTHER)

## 2024-01-20 ENCOUNTER — Telehealth: Payer: Self-pay

## 2024-01-20 ENCOUNTER — Other Ambulatory Visit (HOSPITAL_BASED_OUTPATIENT_CLINIC_OR_DEPARTMENT_OTHER): Payer: Self-pay

## 2024-01-20 DIAGNOSIS — I5020 Unspecified systolic (congestive) heart failure: Secondary | ICD-10-CM

## 2024-01-20 DIAGNOSIS — I255 Ischemic cardiomyopathy: Secondary | ICD-10-CM

## 2024-01-20 NOTE — Telephone Encounter (Signed)
 Take an extra pm lasix , 160 twice daily for 2 days, then back to usual dosing. GT

## 2024-01-20 NOTE — Telephone Encounter (Signed)
 Contacted patient and made him aware of Dr. Adrian orders.  He knows to call our office if symptoms worsen or do not improve, we will continue to monitor HF logistics with device.  Reviewed diet and salt intake.  Patient given our direct number if any concerns.

## 2024-01-20 NOTE — Telephone Encounter (Signed)
 Can we call him and see how he is feeling?  His device is alerting for possible HF symptoms.   I need to know how his breathing is, weight, and if he is peeing well after he takes his lasix .   Have him come in for a BMET, proBNP please.

## 2024-01-20 NOTE — Addendum Note (Signed)
 Addended by: ONEITA BERLINER on: 01/20/2024 01:31 PM   Modules accepted: Orders

## 2024-01-20 NOTE — Telephone Encounter (Signed)
 Unscheduled remote transmission: Scheduled non-billable, re-check optivol Normal device function.  HF diagnostics remain abnormal -  CRT-D device indicates ongoing signs of possible fluid retention.  Spoke with patient.  He has noted some mild increase in SOB with exertion and 1 day this week noted feet/ankle swelling, otherwise asymptomatic.  He is taking his Lasix  160mg  qam and 80mg  qpm without any missed doses.  Denies any changes in diet or health.  Reminded to limit salt. Patient does not weigh himself daily.   Will forward to his general cardiology team in [place] to review for HF management.

## 2024-01-20 NOTE — Telephone Encounter (Signed)
 Can we call him and see how he is feeling?  Pt states that he feels pretty good.   His device is alerting for possible HF symptoms.    I need to know how his breathing is, weight, and if he is peeing well after taking Lasix .  Pt states that his breathing is okay then states that he gets short of breath some because he started smoking again.  Pt states that he only weighs himself every month or two and that he has been losing weight each month.  Pt states that he feels like he is peeing okay that he usually goes 6-7 times before noon.  Pt aware that he needs labs and will come tomorrow.

## 2024-01-21 DIAGNOSIS — I5020 Unspecified systolic (congestive) heart failure: Secondary | ICD-10-CM | POA: Diagnosis not present

## 2024-01-22 ENCOUNTER — Ambulatory Visit: Payer: Self-pay | Admitting: Cardiology

## 2024-01-22 LAB — BASIC METABOLIC PANEL WITH GFR
BUN/Creatinine Ratio: 10 (ref 10–24)
BUN: 8 mg/dL (ref 8–27)
CO2: 28 mmol/L (ref 20–29)
Calcium: 9.1 mg/dL (ref 8.6–10.2)
Chloride: 98 mmol/L (ref 96–106)
Creatinine, Ser: 0.8 mg/dL (ref 0.76–1.27)
Glucose: 110 mg/dL — ABNORMAL HIGH (ref 70–99)
Potassium: 4.1 mmol/L (ref 3.5–5.2)
Sodium: 142 mmol/L (ref 134–144)
eGFR: 93 mL/min/1.73

## 2024-01-22 LAB — PRO B NATRIURETIC PEPTIDE: NT-Pro BNP: 1167 pg/mL — ABNORMAL HIGH (ref 0–376)

## 2024-01-25 DIAGNOSIS — J449 Chronic obstructive pulmonary disease, unspecified: Secondary | ICD-10-CM | POA: Diagnosis not present

## 2024-01-25 DIAGNOSIS — E1169 Type 2 diabetes mellitus with other specified complication: Secondary | ICD-10-CM | POA: Diagnosis not present

## 2024-01-25 DIAGNOSIS — Z79899 Other long term (current) drug therapy: Secondary | ICD-10-CM | POA: Diagnosis not present

## 2024-01-25 DIAGNOSIS — I10 Essential (primary) hypertension: Secondary | ICD-10-CM | POA: Diagnosis not present

## 2024-01-25 DIAGNOSIS — I5022 Chronic systolic (congestive) heart failure: Secondary | ICD-10-CM | POA: Diagnosis not present

## 2024-01-25 DIAGNOSIS — E7800 Pure hypercholesterolemia, unspecified: Secondary | ICD-10-CM | POA: Diagnosis not present

## 2024-01-25 DIAGNOSIS — Z7901 Long term (current) use of anticoagulants: Secondary | ICD-10-CM | POA: Diagnosis not present

## 2024-01-25 DIAGNOSIS — I482 Chronic atrial fibrillation, unspecified: Secondary | ICD-10-CM | POA: Diagnosis not present

## 2024-02-01 ENCOUNTER — Other Ambulatory Visit (HOSPITAL_BASED_OUTPATIENT_CLINIC_OR_DEPARTMENT_OTHER): Payer: Self-pay

## 2024-02-01 MED ORDER — WARFARIN SODIUM 5 MG PO TABS
5.0000 mg | ORAL_TABLET | Freq: Every day | ORAL | 0 refills | Status: DC
  Filled 2024-02-01: qty 90, 90d supply, fill #0

## 2024-02-01 MED ORDER — ENALAPRIL MALEATE 5 MG PO TABS
2.5000 mg | ORAL_TABLET | Freq: Every day | ORAL | 0 refills | Status: DC
  Filled 2024-02-01: qty 45, 90d supply, fill #0

## 2024-02-02 ENCOUNTER — Other Ambulatory Visit (HOSPITAL_BASED_OUTPATIENT_CLINIC_OR_DEPARTMENT_OTHER): Payer: Self-pay

## 2024-02-09 DIAGNOSIS — Z7901 Long term (current) use of anticoagulants: Secondary | ICD-10-CM | POA: Diagnosis not present

## 2024-02-15 ENCOUNTER — Other Ambulatory Visit (HOSPITAL_BASED_OUTPATIENT_CLINIC_OR_DEPARTMENT_OTHER): Payer: Self-pay

## 2024-02-15 MED ORDER — ALBUTEROL SULFATE HFA 108 (90 BASE) MCG/ACT IN AERS
2.0000 | INHALATION_SPRAY | RESPIRATORY_TRACT | 0 refills | Status: DC
  Filled 2024-02-15: qty 6.7, 25d supply, fill #0

## 2024-02-22 ENCOUNTER — Other Ambulatory Visit (HOSPITAL_BASED_OUTPATIENT_CLINIC_OR_DEPARTMENT_OTHER): Payer: Self-pay

## 2024-02-22 MED ORDER — METFORMIN HCL ER 500 MG PO TB24
1000.0000 mg | ORAL_TABLET | Freq: Two times a day (BID) | ORAL | 0 refills | Status: AC
  Filled 2024-02-22: qty 360, 90d supply, fill #0

## 2024-02-25 DIAGNOSIS — J449 Chronic obstructive pulmonary disease, unspecified: Secondary | ICD-10-CM | POA: Diagnosis not present

## 2024-02-25 DIAGNOSIS — E1169 Type 2 diabetes mellitus with other specified complication: Secondary | ICD-10-CM | POA: Diagnosis not present

## 2024-02-25 DIAGNOSIS — J9611 Chronic respiratory failure with hypoxia: Secondary | ICD-10-CM | POA: Diagnosis not present

## 2024-02-25 DIAGNOSIS — E7800 Pure hypercholesterolemia, unspecified: Secondary | ICD-10-CM | POA: Diagnosis not present

## 2024-02-25 DIAGNOSIS — Z79899 Other long term (current) drug therapy: Secondary | ICD-10-CM | POA: Diagnosis not present

## 2024-02-25 DIAGNOSIS — Z7901 Long term (current) use of anticoagulants: Secondary | ICD-10-CM | POA: Diagnosis not present

## 2024-02-25 DIAGNOSIS — I10 Essential (primary) hypertension: Secondary | ICD-10-CM | POA: Diagnosis not present

## 2024-02-25 DIAGNOSIS — I482 Chronic atrial fibrillation, unspecified: Secondary | ICD-10-CM | POA: Diagnosis not present

## 2024-02-29 ENCOUNTER — Other Ambulatory Visit (HOSPITAL_COMMUNITY): Payer: Self-pay

## 2024-02-29 ENCOUNTER — Other Ambulatory Visit (HOSPITAL_BASED_OUTPATIENT_CLINIC_OR_DEPARTMENT_OTHER): Payer: Self-pay

## 2024-02-29 MED ORDER — FUROSEMIDE 80 MG PO TABS
ORAL_TABLET | ORAL | 0 refills | Status: AC
  Filled 2024-02-29: qty 230, 77d supply, fill #0
  Filled 2024-02-29: qty 40, 13d supply, fill #0

## 2024-02-29 MED ORDER — ALBUTEROL SULFATE HFA 108 (90 BASE) MCG/ACT IN AERS
2.0000 | INHALATION_SPRAY | RESPIRATORY_TRACT | 0 refills | Status: DC
  Filled 2024-02-29: qty 6.7, 25d supply, fill #0

## 2024-02-29 NOTE — Progress Notes (Signed)
 Remote ICD transmission.

## 2024-03-01 DIAGNOSIS — Z7901 Long term (current) use of anticoagulants: Secondary | ICD-10-CM | POA: Diagnosis not present

## 2024-03-09 ENCOUNTER — Other Ambulatory Visit (HOSPITAL_BASED_OUTPATIENT_CLINIC_OR_DEPARTMENT_OTHER): Payer: Self-pay

## 2024-03-09 DIAGNOSIS — I10 Essential (primary) hypertension: Secondary | ICD-10-CM | POA: Diagnosis not present

## 2024-03-09 DIAGNOSIS — Z6831 Body mass index (BMI) 31.0-31.9, adult: Secondary | ICD-10-CM | POA: Diagnosis not present

## 2024-03-09 DIAGNOSIS — E1169 Type 2 diabetes mellitus with other specified complication: Secondary | ICD-10-CM | POA: Diagnosis not present

## 2024-03-09 DIAGNOSIS — I482 Chronic atrial fibrillation, unspecified: Secondary | ICD-10-CM | POA: Diagnosis not present

## 2024-03-09 DIAGNOSIS — J9611 Chronic respiratory failure with hypoxia: Secondary | ICD-10-CM | POA: Diagnosis not present

## 2024-03-09 DIAGNOSIS — E7800 Pure hypercholesterolemia, unspecified: Secondary | ICD-10-CM | POA: Diagnosis not present

## 2024-03-09 DIAGNOSIS — E669 Obesity, unspecified: Secondary | ICD-10-CM | POA: Diagnosis not present

## 2024-03-09 DIAGNOSIS — J449 Chronic obstructive pulmonary disease, unspecified: Secondary | ICD-10-CM | POA: Diagnosis not present

## 2024-03-09 MED ORDER — ROSUVASTATIN CALCIUM 20 MG PO TABS
20.0000 mg | ORAL_TABLET | Freq: Every day | ORAL | 1 refills | Status: DC
  Filled 2024-03-09 (×2): qty 90, 90d supply, fill #0

## 2024-03-21 ENCOUNTER — Other Ambulatory Visit (HOSPITAL_BASED_OUTPATIENT_CLINIC_OR_DEPARTMENT_OTHER): Payer: Self-pay

## 2024-03-22 ENCOUNTER — Other Ambulatory Visit (HOSPITAL_BASED_OUTPATIENT_CLINIC_OR_DEPARTMENT_OTHER): Payer: Self-pay

## 2024-03-22 MED ORDER — BUDESON-GLYCOPYRROL-FORMOTEROL 160-9-4.8 MCG/ACT IN AERO
2.0000 | INHALATION_SPRAY | Freq: Two times a day (BID) | RESPIRATORY_TRACT | 0 refills | Status: AC
  Filled 2024-03-22: qty 10.7, 30d supply, fill #0

## 2024-03-27 DIAGNOSIS — J9611 Chronic respiratory failure with hypoxia: Secondary | ICD-10-CM | POA: Diagnosis not present

## 2024-03-27 DIAGNOSIS — E1169 Type 2 diabetes mellitus with other specified complication: Secondary | ICD-10-CM | POA: Diagnosis not present

## 2024-03-27 DIAGNOSIS — I10 Essential (primary) hypertension: Secondary | ICD-10-CM | POA: Diagnosis not present

## 2024-03-27 DIAGNOSIS — Z79899 Other long term (current) drug therapy: Secondary | ICD-10-CM | POA: Diagnosis not present

## 2024-03-27 DIAGNOSIS — I482 Chronic atrial fibrillation, unspecified: Secondary | ICD-10-CM | POA: Diagnosis not present

## 2024-03-27 DIAGNOSIS — J449 Chronic obstructive pulmonary disease, unspecified: Secondary | ICD-10-CM | POA: Diagnosis not present

## 2024-03-28 ENCOUNTER — Other Ambulatory Visit (HOSPITAL_BASED_OUTPATIENT_CLINIC_OR_DEPARTMENT_OTHER): Payer: Self-pay

## 2024-03-28 MED FILL — Eplerenone Tab 25 MG: ORAL | 60 days supply | Qty: 60 | Fill #2 | Status: AC

## 2024-03-29 ENCOUNTER — Other Ambulatory Visit (HOSPITAL_BASED_OUTPATIENT_CLINIC_OR_DEPARTMENT_OTHER): Payer: Self-pay

## 2024-03-30 DIAGNOSIS — I482 Chronic atrial fibrillation, unspecified: Secondary | ICD-10-CM | POA: Diagnosis not present

## 2024-04-07 ENCOUNTER — Ambulatory Visit: Payer: Medicare Other

## 2024-04-07 DIAGNOSIS — I255 Ischemic cardiomyopathy: Secondary | ICD-10-CM | POA: Diagnosis not present

## 2024-04-07 LAB — CUP PACEART REMOTE DEVICE CHECK
Battery Remaining Longevity: 87 mo
Battery Voltage: 2.99 V
Brady Statistic RV Percent Paced: 99.35 %
Date Time Interrogation Session: 20250910212503
HighPow Impedance: 74 Ohm
Implantable Lead Connection Status: 753985
Implantable Lead Connection Status: 753985
Implantable Lead Implant Date: 20200103
Implantable Lead Implant Date: 20241211
Implantable Lead Location: 753858
Implantable Lead Location: 753860
Implantable Lead Model: 4398
Implantable Lead Model: 6935
Implantable Pulse Generator Implant Date: 20241211
Lead Channel Impedance Value: 3000 Ohm
Lead Channel Impedance Value: 304 Ohm
Lead Channel Impedance Value: 323 Ohm
Lead Channel Impedance Value: 399 Ohm
Lead Channel Impedance Value: 399 Ohm
Lead Channel Impedance Value: 418 Ohm
Lead Channel Impedance Value: 456 Ohm
Lead Channel Impedance Value: 494 Ohm
Lead Channel Impedance Value: 570 Ohm
Lead Channel Impedance Value: 646 Ohm
Lead Channel Impedance Value: 684 Ohm
Lead Channel Impedance Value: 684 Ohm
Lead Channel Impedance Value: 779 Ohm
Lead Channel Pacing Threshold Amplitude: 0.5 V
Lead Channel Pacing Threshold Amplitude: 1.5 V
Lead Channel Pacing Threshold Pulse Width: 0.4 ms
Lead Channel Pacing Threshold Pulse Width: 0.6 ms
Lead Channel Sensing Intrinsic Amplitude: 16.9 mV
Lead Channel Setting Pacing Amplitude: 2 V
Lead Channel Setting Pacing Amplitude: 2.5 V
Lead Channel Setting Pacing Pulse Width: 0.4 ms
Lead Channel Setting Pacing Pulse Width: 0.6 ms
Lead Channel Setting Sensing Sensitivity: 0.3 mV
Zone Setting Status: 755011

## 2024-04-13 NOTE — Progress Notes (Signed)
Remote ICD Transmission.

## 2024-04-15 ENCOUNTER — Ambulatory Visit: Payer: Self-pay | Admitting: Internal Medicine

## 2024-04-15 NOTE — Progress Notes (Signed)
Remote ICD Transmission.

## 2024-04-26 DIAGNOSIS — E7800 Pure hypercholesterolemia, unspecified: Secondary | ICD-10-CM | POA: Diagnosis not present

## 2024-04-26 DIAGNOSIS — I10 Essential (primary) hypertension: Secondary | ICD-10-CM | POA: Diagnosis not present

## 2024-04-26 DIAGNOSIS — Z79899 Other long term (current) drug therapy: Secondary | ICD-10-CM | POA: Diagnosis not present

## 2024-04-26 DIAGNOSIS — J449 Chronic obstructive pulmonary disease, unspecified: Secondary | ICD-10-CM | POA: Diagnosis not present

## 2024-04-26 DIAGNOSIS — Z7901 Long term (current) use of anticoagulants: Secondary | ICD-10-CM | POA: Diagnosis not present

## 2024-04-26 DIAGNOSIS — E1169 Type 2 diabetes mellitus with other specified complication: Secondary | ICD-10-CM | POA: Diagnosis not present

## 2024-04-26 DIAGNOSIS — I482 Chronic atrial fibrillation, unspecified: Secondary | ICD-10-CM | POA: Diagnosis not present

## 2024-05-02 ENCOUNTER — Other Ambulatory Visit (HOSPITAL_BASED_OUTPATIENT_CLINIC_OR_DEPARTMENT_OTHER): Payer: Self-pay

## 2024-05-02 ENCOUNTER — Other Ambulatory Visit: Payer: Self-pay

## 2024-05-02 MED ORDER — ENALAPRIL MALEATE 5 MG PO TABS
2.5000 mg | ORAL_TABLET | Freq: Every day | ORAL | 0 refills | Status: AC
  Filled 2024-05-02: qty 45, 90d supply, fill #0

## 2024-05-02 MED ORDER — WARFARIN SODIUM 5 MG PO TABS
5.0000 mg | ORAL_TABLET | Freq: Every day | ORAL | 0 refills | Status: DC
  Filled 2024-05-02: qty 90, 90d supply, fill #0

## 2024-05-02 MED ORDER — WARFARIN SODIUM 1 MG PO TABS
1.0000 mg | ORAL_TABLET | Freq: Every day | ORAL | 1 refills | Status: AC
  Filled 2024-05-02: qty 60, 60d supply, fill #0

## 2024-05-03 ENCOUNTER — Other Ambulatory Visit (HOSPITAL_BASED_OUTPATIENT_CLINIC_OR_DEPARTMENT_OTHER): Payer: Self-pay

## 2024-05-03 DIAGNOSIS — I482 Chronic atrial fibrillation, unspecified: Secondary | ICD-10-CM | POA: Diagnosis not present

## 2024-05-10 ENCOUNTER — Other Ambulatory Visit: Payer: Self-pay

## 2024-05-10 NOTE — Progress Notes (Unsigned)
 Cardiology Office Note:    Date:  05/11/2024   ID:  Jaime Scott, DOB 1951-06-08, MRN [id number]  PCP:  Gayl Males, MD  Cardiologist:  Redell Leiter, MD    Referring MD: Gayl Males, MD    ASSESSMENT:    1. Chronic atrial fibrillation (HCC)   2. ICD (implantable cardioverter-defibrillator) in place   3. Biventricular cardiac pacemaker in situ   4. Heart failure with improved ejection fraction (HFimpEF) (HCC)   5. Hypertensive heart disease with chronic combined systolic and diastolic congestive heart failure (HCC)   6. Long term (current) use of anticoagulants   7. Mixed hyperlipidemia   8. Ascending aorta enlargement    PLAN:    In order of problems listed above:  Geroge continues to do well with guideline directed therapy for his cardiomyopathy as well as biventricular pacing and rate control of atrial fibrillation Heart failure stable ejection fraction is improved he will continue his current loop diet MRA beta-blocker and ACE inhibitor. He has declined Entresto  Stable device function although the pocket is quite thin I told him to wear nothing over the shoulder no suspenders to check every day in the mirror and if he sees any indication of swelling or compromise skin to contact us  Continue his current anti-coagulant manage with his PCP warfarin Continue his high intensity statin LDL at target Will reassess at his follow-up echocardiogram in 6 months   Next appointment: 7-month follow-up he sees his PCP every 3 months with office labs   Medication Adjustments/Labs and Tests Ordered: Current medicines are reviewed at length with the patient today.  Concerns regarding medicines are outlined above.  No orders of the defined types were placed in this encounter.  No orders of the defined types were placed in this encounter.    History of Present Illness:    Jaime Scott is a 73 y.o. male with a hx of chronic atrial fibrillation on long-term  anticoagulation systolic heart failure not on beta-blocker due to underlying COPD he has hypertensive heart disease and an CRT/ICD PAD and CLL last seen 04/23/2023.  His last ejection fraction in March 2023 and improved to 45 to 50% with biatrial enlargement and moderate enlargement of ascending aorta.  Compliance with diet, lifestyle and medications: Yes  I would best describe [name] is stable limited by his chronic respiratory problems but is not having edema orthopnea chest pain palpitation or syncope Back in June he had alerts about heart failure from his device it seems like he was sent to have a BNP level is elevated and despite multiple documentation of calls he said he never received any information from the office never increased his diuretic and never had a change in his symptoms. Labs are checked every 3 months with his PCP June creatinine 0.8 potassium 4.1 his last hemoglobin 11.2 March of this year cholesterol 138 LDL 64 We discussed rechecking an echocardiogram he has to delay to his next visit Past Medical History:  Diagnosis Date   Acute on chronic systolic CHF (congestive heart failure) (HCC) 07/09/2023   Acute on chronic systolic heart failure (HCC) 07/08/2023   Alcohol  abuse    Arthritis    Atrial fibrillation (HCC) 08/02/2018   CHF (congestive heart failure) (HCC) 03/12/2017   Chronic atrial fibrillation (HCC) 03/12/2017   Chronic respiratory failure (HCC)    Chronic systolic heart failure (HCC) 03/12/2017   COPD (chronic obstructive pulmonary disease) (HCC) 03/12/2017   Dilated cardiomyopathy (HCC) 03/12/2017   Dyspnea  wears Oxygen  24/7 3 L   Essential hypertension 03/12/2017   High risk medication use 05/05/2017   Hyperlipidemia 03/12/2017   Hypertensive heart disease with heart failure (HCC) 03/12/2017   ICD (implantable cardioverter-defibrillator) in place 07/30/2018   Ischemic cardiomyopathy 03/12/2017   LBBB (left bundle branch block) 06/20/2015   Long term  (current) use of anticoagulants 03/12/2017   OSA (obstructive sleep apnea) 08/09/2018   Peripheral vascular disease 03/12/2017   Preoperative cardiovascular examination 03/25/2015   Presence of combination internal cardiac defibrillator (ICD) and pacemaker 03/12/2017   unable to place a CRT    Tobacco abuse 08/09/2018   Type 2 diabetes mellitus without complications (HCC) 03/12/2017    Current Medications: Current Meds  Medication Sig   budesonide -glycopyrrolate -formoterol  (BREZTRI ) 160-9-4.8 MCG/ACT AERO inhaler Inhale 2 puffs into the lungs 2 (two) times daily.   enalapril  (VASOTEC ) 5 MG tablet Take 0.5 tablets (2.5 mg total) by mouth daily.   eplerenone  (INSPRA ) 25 MG tablet Take 1 tablet (25 mg total) by mouth daily.   furosemide  (LASIX ) 80 MG tablet Take 2 tablets (160 mg total) by mouth every morning AND 1 tablet (80 mg total) at bedtime.   metFORMIN  (GLUCOPHAGE -XR) 500 MG 24 hr tablet Take 2 tablets (1,000 mg total) by mouth 2 (two) times daily.   metoprolol  succinate (TOPROL -XL) 25 MG 24 hr tablet Take 1 tablet (25 mg total) by mouth daily with food   OXYGEN  Inhale 3 L into the lungs as needed.   Potassium Chloride  ER 20 MEQ TBCR Take 1 tablet (20 mEq total) by mouth 2 (two) times daily with food   rosuvastatin  (CRESTOR ) 20 MG tablet Take 1 tablet (20 mg total) by mouth daily.   TURMERIC EXTRA STRENGTH PO Take 1 tablet by mouth daily.   warfarin (COUMADIN ) 1 MG tablet Take 1 tablet (1 mg total) by mouth daily.   warfarin (COUMADIN ) 5 MG tablet Take 1 tablet (5 mg total) by mouth daily.   [DISCONTINUED] potassium chloride  SA (K-DUR,KLOR-CON ) 20 MEQ tablet Take 20 mEq by mouth 2 (two) times daily.      EKGs/Labs/Other Studies Reviewed:    The following studies were reviewed today:         Recent Labs: 07/09/2023: Hemoglobin 11.2; Platelets 252 01/21/2024: BUN 8; Creatinine, Ser 0.80; NT-Pro BNP 1,167; Potassium 4.1; Sodium 142    Physical Exam:    VS:  BP (!) 130/50    Pulse 77   Ht 5' 7 (1.702 m)   Wt 195 lb 9.6 oz (88.7 kg)   SpO2 95%   BMI 30.64 kg/m     Wt Readings from Last 3 Encounters:  05/11/24 195 lb 9.6 oz (88.7 kg)  10/26/23 198 lb (89.8 kg)  10/13/23 198 lb (89.8 kg)     GEN: COPD appearance well nourished, well developed in no acute distress HEENT: Normal NECK: No JVD; No carotid bruits LYMPHATICS: No lymphadenopathy CARDIAC: Pacemaker pocket is quite thin there is no effusion or compromise of the skin RRR,  RESPIRATORY:  Clear to auscultation without rales, wheezing or rhonchi  ABDOMEN: Soft, non-tender, non-distended MUSCULOSKELETAL:  No edema; No deformity  SKIN: Warm and dry NEUROLOGIC:  Alert and oriented x 3 PSYCHIATRIC:  Normal affect    Signed, Redell Leiter, MD  05/11/2024 11:55 AM    [facility]

## 2024-05-11 ENCOUNTER — Encounter: Payer: Self-pay | Admitting: Cardiology

## 2024-05-11 ENCOUNTER — Ambulatory Visit: Attending: Cardiology | Admitting: Cardiology

## 2024-05-11 VITALS — BP 130/50 | HR 77 | Ht 67.0 in | Wt 195.6 lb

## 2024-05-11 DIAGNOSIS — I5042 Chronic combined systolic (congestive) and diastolic (congestive) heart failure: Secondary | ICD-10-CM | POA: Diagnosis not present

## 2024-05-11 DIAGNOSIS — I7789 Other specified disorders of arteries and arterioles: Secondary | ICD-10-CM | POA: Insufficient documentation

## 2024-05-11 DIAGNOSIS — Z95810 Presence of automatic (implantable) cardiac defibrillator: Secondary | ICD-10-CM | POA: Insufficient documentation

## 2024-05-11 DIAGNOSIS — I110 Hypertensive heart disease with heart failure: Secondary | ICD-10-CM | POA: Diagnosis not present

## 2024-05-11 DIAGNOSIS — Z950 Presence of cardiac pacemaker: Secondary | ICD-10-CM | POA: Insufficient documentation

## 2024-05-11 DIAGNOSIS — E782 Mixed hyperlipidemia: Secondary | ICD-10-CM | POA: Insufficient documentation

## 2024-05-11 DIAGNOSIS — Z7901 Long term (current) use of anticoagulants: Secondary | ICD-10-CM | POA: Diagnosis not present

## 2024-05-11 DIAGNOSIS — I5020 Unspecified systolic (congestive) heart failure: Secondary | ICD-10-CM | POA: Insufficient documentation

## 2024-05-11 DIAGNOSIS — I482 Chronic atrial fibrillation, unspecified: Secondary | ICD-10-CM | POA: Insufficient documentation

## 2024-05-11 NOTE — Addendum Note (Signed)
 Addended by: SHERRE ADE I on: 05/11/2024 12:09 PM   Modules accepted: Orders

## 2024-05-11 NOTE — Patient Instructions (Signed)
 Medication Instructions:  Your physician recommends that you continue on your current medications as directed. Please refer to the Current Medication list given to you today.  *If you need a refill on your cardiac medications before your next appointment, please call your pharmacy*  Lab Work: None If you have labs (blood work) drawn today and your tests are completely normal, you will receive your results only by: MyChart Message (if you have MyChart) OR A paper copy in the mail If you have any lab test that is abnormal or we need to change your treatment, we will call you to review the results.  Testing/Procedures: Your physician has requested that you have an echocardiogram. Echocardiography is a painless test that uses sound waves to create images of your heart. It provides your doctor with information about the size and shape of your heart and how well your heart's chambers and valves are working. This procedure takes approximately one hour. There are no restrictions for this procedure. Please do NOT wear cologne, perfume, aftershave, or lotions (deodorant is allowed). Please arrive 15 minutes prior to your appointment time.  Please note: We ask at that you not bring children with you during ultrasound (echo/ vascular) testing. Due to room size and safety concerns, children are not allowed in the ultrasound rooms during exams. Our front office staff cannot provide observation of children in our lobby area while testing is being conducted. An adult accompanying a patient to their appointment will only be allowed in the ultrasound room at the discretion of the ultrasound technician under special circumstances. We apologize for any inconvenience.   Follow-Up: At [facility], you and your health needs are our priority.  As part of our continuing mission to provide you with exceptional heart care, our providers are all part of one team.  This team includes your primary Cardiologist  (physician) and Advanced Practice Providers or APPs (Physician Assistants and Nurse Practitioners) who all work together to provide you with the care you need, when you need it.  Your next appointment:   6 month(s)  Provider:   Redell Leiter, MD    We recommend signing up for the patient portal called MyChart.  Sign up information is provided on this After Visit Summary.  MyChart is used to connect with patients for Virtual Visits (Telemedicine).  Patients are able to view lab/test results, encounter notes, upcoming appointments, etc.  Non-urgent messages can be sent to your provider as well.   To learn more about what you can do with MyChart, go to [email or web address].   Other Instructions None

## 2024-05-16 DIAGNOSIS — J208 Acute bronchitis due to other specified organisms: Secondary | ICD-10-CM | POA: Diagnosis not present

## 2024-05-16 DIAGNOSIS — J440 Chronic obstructive pulmonary disease with acute lower respiratory infection: Secondary | ICD-10-CM | POA: Diagnosis not present

## 2024-05-16 DIAGNOSIS — I480 Paroxysmal atrial fibrillation: Secondary | ICD-10-CM | POA: Diagnosis present

## 2024-05-16 DIAGNOSIS — I110 Hypertensive heart disease with heart failure: Secondary | ICD-10-CM | POA: Diagnosis present

## 2024-05-16 DIAGNOSIS — R41841 Cognitive communication deficit: Secondary | ICD-10-CM | POA: Diagnosis not present

## 2024-05-16 DIAGNOSIS — M25551 Pain in right hip: Secondary | ICD-10-CM | POA: Diagnosis not present

## 2024-05-16 DIAGNOSIS — R262 Difficulty in walking, not elsewhere classified: Secondary | ICD-10-CM | POA: Diagnosis not present

## 2024-05-16 DIAGNOSIS — S72141A Displaced intertrochanteric fracture of right femur, initial encounter for closed fracture: Secondary | ICD-10-CM | POA: Diagnosis present

## 2024-05-16 DIAGNOSIS — Z01810 Encounter for preprocedural cardiovascular examination: Secondary | ICD-10-CM | POA: Diagnosis not present

## 2024-05-16 DIAGNOSIS — I5022 Chronic systolic (congestive) heart failure: Secondary | ICD-10-CM | POA: Diagnosis not present

## 2024-05-16 DIAGNOSIS — Z8781 Personal history of (healed) traumatic fracture: Secondary | ICD-10-CM | POA: Diagnosis not present

## 2024-05-16 DIAGNOSIS — W010XXA Fall on same level from slipping, tripping and stumbling without subsequent striking against object, initial encounter: Secondary | ICD-10-CM | POA: Diagnosis not present

## 2024-05-16 DIAGNOSIS — D62 Acute posthemorrhagic anemia: Secondary | ICD-10-CM | POA: Diagnosis not present

## 2024-05-16 DIAGNOSIS — I426 Alcoholic cardiomyopathy: Secondary | ICD-10-CM | POA: Diagnosis not present

## 2024-05-16 DIAGNOSIS — J9611 Chronic respiratory failure with hypoxia: Secondary | ICD-10-CM | POA: Diagnosis not present

## 2024-05-16 DIAGNOSIS — J9621 Acute and chronic respiratory failure with hypoxia: Secondary | ICD-10-CM | POA: Diagnosis not present

## 2024-05-16 DIAGNOSIS — E1169 Type 2 diabetes mellitus with other specified complication: Secondary | ICD-10-CM | POA: Diagnosis not present

## 2024-05-16 DIAGNOSIS — Z7984 Long term (current) use of oral hypoglycemic drugs: Secondary | ICD-10-CM | POA: Diagnosis not present

## 2024-05-16 DIAGNOSIS — M25572 Pain in left ankle and joints of left foot: Secondary | ICD-10-CM | POA: Diagnosis not present

## 2024-05-16 DIAGNOSIS — E1159 Type 2 diabetes mellitus with other circulatory complications: Secondary | ICD-10-CM | POA: Diagnosis not present

## 2024-05-16 DIAGNOSIS — M6259 Muscle wasting and atrophy, not elsewhere classified, multiple sites: Secondary | ICD-10-CM | POA: Diagnosis not present

## 2024-05-16 DIAGNOSIS — Z79899 Other long term (current) drug therapy: Secondary | ICD-10-CM | POA: Diagnosis not present

## 2024-05-16 DIAGNOSIS — R079 Chest pain, unspecified: Secondary | ICD-10-CM | POA: Diagnosis not present

## 2024-05-16 DIAGNOSIS — I119 Hypertensive heart disease without heart failure: Secondary | ICD-10-CM | POA: Diagnosis not present

## 2024-05-16 DIAGNOSIS — F17200 Nicotine dependence, unspecified, uncomplicated: Secondary | ICD-10-CM | POA: Diagnosis not present

## 2024-05-16 DIAGNOSIS — Z720 Tobacco use: Secondary | ICD-10-CM | POA: Diagnosis not present

## 2024-05-16 DIAGNOSIS — Z9981 Dependence on supplemental oxygen: Secondary | ICD-10-CM | POA: Diagnosis not present

## 2024-05-16 DIAGNOSIS — I491 Atrial premature depolarization: Secondary | ICD-10-CM | POA: Diagnosis not present

## 2024-05-16 DIAGNOSIS — M1990 Unspecified osteoarthritis, unspecified site: Secondary | ICD-10-CM | POA: Diagnosis present

## 2024-05-16 DIAGNOSIS — I4510 Unspecified right bundle-branch block: Secondary | ICD-10-CM | POA: Diagnosis not present

## 2024-05-16 DIAGNOSIS — Z4789 Encounter for other orthopedic aftercare: Secondary | ICD-10-CM | POA: Diagnosis not present

## 2024-05-16 DIAGNOSIS — R2681 Unsteadiness on feet: Secondary | ICD-10-CM | POA: Diagnosis not present

## 2024-05-16 DIAGNOSIS — I509 Heart failure, unspecified: Secondary | ICD-10-CM | POA: Diagnosis not present

## 2024-05-16 DIAGNOSIS — I350 Nonrheumatic aortic (valve) stenosis: Secondary | ICD-10-CM | POA: Diagnosis not present

## 2024-05-16 DIAGNOSIS — Z95810 Presence of automatic (implantable) cardiac defibrillator: Secondary | ICD-10-CM | POA: Diagnosis not present

## 2024-05-16 DIAGNOSIS — R9431 Abnormal electrocardiogram [ECG] [EKG]: Secondary | ICD-10-CM | POA: Diagnosis not present

## 2024-05-16 DIAGNOSIS — J449 Chronic obstructive pulmonary disease, unspecified: Secondary | ICD-10-CM | POA: Diagnosis not present

## 2024-05-16 DIAGNOSIS — Z7901 Long term (current) use of anticoagulants: Secondary | ICD-10-CM | POA: Diagnosis not present

## 2024-05-16 DIAGNOSIS — I5023 Acute on chronic systolic (congestive) heart failure: Secondary | ICD-10-CM | POA: Diagnosis not present

## 2024-05-16 DIAGNOSIS — E7800 Pure hypercholesterolemia, unspecified: Secondary | ICD-10-CM | POA: Diagnosis present

## 2024-05-16 DIAGNOSIS — Z789 Other specified health status: Secondary | ICD-10-CM | POA: Diagnosis not present

## 2024-05-16 DIAGNOSIS — F17210 Nicotine dependence, cigarettes, uncomplicated: Secondary | ICD-10-CM | POA: Diagnosis present

## 2024-05-16 DIAGNOSIS — R531 Weakness: Secondary | ICD-10-CM | POA: Diagnosis not present

## 2024-05-16 DIAGNOSIS — E119 Type 2 diabetes mellitus without complications: Secondary | ICD-10-CM | POA: Diagnosis present

## 2024-05-16 DIAGNOSIS — R1311 Dysphagia, oral phase: Secondary | ICD-10-CM | POA: Diagnosis not present

## 2024-05-16 DIAGNOSIS — K5900 Constipation, unspecified: Secondary | ICD-10-CM | POA: Diagnosis not present

## 2024-05-16 DIAGNOSIS — I482 Chronic atrial fibrillation, unspecified: Secondary | ICD-10-CM | POA: Diagnosis not present

## 2024-05-16 DIAGNOSIS — B9689 Other specified bacterial agents as the cause of diseases classified elsewhere: Secondary | ICD-10-CM | POA: Diagnosis not present

## 2024-05-16 DIAGNOSIS — S72141D Displaced intertrochanteric fracture of right femur, subsequent encounter for closed fracture with routine healing: Secondary | ICD-10-CM | POA: Diagnosis not present

## 2024-05-16 DIAGNOSIS — I4729 Other ventricular tachycardia: Secondary | ICD-10-CM | POA: Diagnosis not present

## 2024-05-16 DIAGNOSIS — I428 Other cardiomyopathies: Secondary | ICD-10-CM | POA: Diagnosis present

## 2024-05-16 DIAGNOSIS — J9601 Acute respiratory failure with hypoxia: Secondary | ICD-10-CM | POA: Diagnosis not present

## 2024-05-16 DIAGNOSIS — W01198A Fall on same level from slipping, tripping and stumbling with subsequent striking against other object, initial encounter: Secondary | ICD-10-CM | POA: Diagnosis not present

## 2024-05-16 DIAGNOSIS — I4891 Unspecified atrial fibrillation: Secondary | ICD-10-CM | POA: Diagnosis not present

## 2024-05-16 DIAGNOSIS — I361 Nonrheumatic tricuspid (valve) insufficiency: Secondary | ICD-10-CM | POA: Diagnosis not present

## 2024-05-16 DIAGNOSIS — Z7401 Bed confinement status: Secondary | ICD-10-CM | POA: Diagnosis not present

## 2024-05-17 DIAGNOSIS — I119 Hypertensive heart disease without heart failure: Secondary | ICD-10-CM | POA: Diagnosis not present

## 2024-05-17 DIAGNOSIS — I350 Nonrheumatic aortic (valve) stenosis: Secondary | ICD-10-CM | POA: Diagnosis not present

## 2024-05-17 DIAGNOSIS — Z01810 Encounter for preprocedural cardiovascular examination: Secondary | ICD-10-CM | POA: Diagnosis not present

## 2024-05-17 DIAGNOSIS — I482 Chronic atrial fibrillation, unspecified: Secondary | ICD-10-CM | POA: Diagnosis not present

## 2024-05-17 DIAGNOSIS — Z7901 Long term (current) use of anticoagulants: Secondary | ICD-10-CM | POA: Diagnosis not present

## 2024-05-17 DIAGNOSIS — I361 Nonrheumatic tricuspid (valve) insufficiency: Secondary | ICD-10-CM | POA: Diagnosis not present

## 2024-05-18 DIAGNOSIS — I482 Chronic atrial fibrillation, unspecified: Secondary | ICD-10-CM | POA: Diagnosis not present

## 2024-05-18 DIAGNOSIS — Z7901 Long term (current) use of anticoagulants: Secondary | ICD-10-CM | POA: Diagnosis not present

## 2024-05-18 DIAGNOSIS — I119 Hypertensive heart disease without heart failure: Secondary | ICD-10-CM | POA: Diagnosis not present

## 2024-05-18 DIAGNOSIS — Z01810 Encounter for preprocedural cardiovascular examination: Secondary | ICD-10-CM | POA: Diagnosis not present

## 2024-05-20 ENCOUNTER — Other Ambulatory Visit (HOSPITAL_BASED_OUTPATIENT_CLINIC_OR_DEPARTMENT_OTHER): Payer: Self-pay

## 2024-05-24 DIAGNOSIS — R531 Weakness: Secondary | ICD-10-CM | POA: Diagnosis not present

## 2024-05-24 DIAGNOSIS — J208 Acute bronchitis due to other specified organisms: Secondary | ICD-10-CM | POA: Diagnosis not present

## 2024-05-24 DIAGNOSIS — Z789 Other specified health status: Secondary | ICD-10-CM | POA: Diagnosis not present

## 2024-05-24 DIAGNOSIS — Z4789 Encounter for other orthopedic aftercare: Secondary | ICD-10-CM | POA: Diagnosis not present

## 2024-05-24 DIAGNOSIS — Z7401 Bed confinement status: Secondary | ICD-10-CM | POA: Diagnosis not present

## 2024-05-24 DIAGNOSIS — E1159 Type 2 diabetes mellitus with other circulatory complications: Secondary | ICD-10-CM | POA: Diagnosis not present

## 2024-05-24 DIAGNOSIS — J449 Chronic obstructive pulmonary disease, unspecified: Secondary | ICD-10-CM | POA: Diagnosis not present

## 2024-05-24 DIAGNOSIS — E1169 Type 2 diabetes mellitus with other specified complication: Secondary | ICD-10-CM | POA: Diagnosis not present

## 2024-05-24 DIAGNOSIS — F17200 Nicotine dependence, unspecified, uncomplicated: Secondary | ICD-10-CM | POA: Diagnosis not present

## 2024-05-24 DIAGNOSIS — S72141A Displaced intertrochanteric fracture of right femur, initial encounter for closed fracture: Secondary | ICD-10-CM | POA: Diagnosis not present

## 2024-05-24 DIAGNOSIS — J9611 Chronic respiratory failure with hypoxia: Secondary | ICD-10-CM | POA: Diagnosis not present

## 2024-05-24 DIAGNOSIS — J9621 Acute and chronic respiratory failure with hypoxia: Secondary | ICD-10-CM | POA: Diagnosis not present

## 2024-05-24 DIAGNOSIS — R2681 Unsteadiness on feet: Secondary | ICD-10-CM | POA: Diagnosis not present

## 2024-05-24 DIAGNOSIS — R41841 Cognitive communication deficit: Secondary | ICD-10-CM | POA: Diagnosis not present

## 2024-05-24 DIAGNOSIS — I5022 Chronic systolic (congestive) heart failure: Secondary | ICD-10-CM | POA: Diagnosis not present

## 2024-05-24 DIAGNOSIS — I4729 Other ventricular tachycardia: Secondary | ICD-10-CM | POA: Diagnosis not present

## 2024-05-24 DIAGNOSIS — J9601 Acute respiratory failure with hypoxia: Secondary | ICD-10-CM | POA: Diagnosis not present

## 2024-05-24 DIAGNOSIS — I509 Heart failure, unspecified: Secondary | ICD-10-CM | POA: Diagnosis not present

## 2024-05-24 DIAGNOSIS — Z8781 Personal history of (healed) traumatic fracture: Secondary | ICD-10-CM | POA: Diagnosis not present

## 2024-05-24 DIAGNOSIS — R1311 Dysphagia, oral phase: Secondary | ICD-10-CM | POA: Diagnosis not present

## 2024-05-24 DIAGNOSIS — I4891 Unspecified atrial fibrillation: Secondary | ICD-10-CM | POA: Diagnosis not present

## 2024-05-24 DIAGNOSIS — I426 Alcoholic cardiomyopathy: Secondary | ICD-10-CM | POA: Diagnosis not present

## 2024-05-24 DIAGNOSIS — M6259 Muscle wasting and atrophy, not elsewhere classified, multiple sites: Secondary | ICD-10-CM | POA: Diagnosis not present

## 2024-05-24 DIAGNOSIS — R262 Difficulty in walking, not elsewhere classified: Secondary | ICD-10-CM | POA: Diagnosis not present

## 2024-05-24 DIAGNOSIS — S72141D Displaced intertrochanteric fracture of right femur, subsequent encounter for closed fracture with routine healing: Secondary | ICD-10-CM | POA: Diagnosis not present

## 2024-05-24 DIAGNOSIS — Z720 Tobacco use: Secondary | ICD-10-CM | POA: Diagnosis not present

## 2024-05-24 DIAGNOSIS — I5023 Acute on chronic systolic (congestive) heart failure: Secondary | ICD-10-CM | POA: Diagnosis not present

## 2024-05-24 DIAGNOSIS — Z9981 Dependence on supplemental oxygen: Secondary | ICD-10-CM | POA: Diagnosis not present

## 2024-05-25 ENCOUNTER — Encounter: Payer: Self-pay | Admitting: Cardiology

## 2024-05-26 DIAGNOSIS — I426 Alcoholic cardiomyopathy: Secondary | ICD-10-CM | POA: Diagnosis not present

## 2024-05-26 DIAGNOSIS — I4891 Unspecified atrial fibrillation: Secondary | ICD-10-CM | POA: Diagnosis not present

## 2024-05-26 DIAGNOSIS — R262 Difficulty in walking, not elsewhere classified: Secondary | ICD-10-CM | POA: Diagnosis not present

## 2024-05-26 DIAGNOSIS — Z8781 Personal history of (healed) traumatic fracture: Secondary | ICD-10-CM | POA: Diagnosis not present

## 2024-05-27 DIAGNOSIS — R262 Difficulty in walking, not elsewhere classified: Secondary | ICD-10-CM | POA: Diagnosis not present

## 2024-05-27 DIAGNOSIS — Z9981 Dependence on supplemental oxygen: Secondary | ICD-10-CM | POA: Diagnosis not present

## 2024-05-27 DIAGNOSIS — J9611 Chronic respiratory failure with hypoxia: Secondary | ICD-10-CM | POA: Diagnosis not present

## 2024-05-30 DIAGNOSIS — R262 Difficulty in walking, not elsewhere classified: Secondary | ICD-10-CM | POA: Diagnosis not present

## 2024-05-30 DIAGNOSIS — J9611 Chronic respiratory failure with hypoxia: Secondary | ICD-10-CM | POA: Diagnosis not present

## 2024-05-30 DIAGNOSIS — Z9981 Dependence on supplemental oxygen: Secondary | ICD-10-CM | POA: Diagnosis not present

## 2024-05-30 DIAGNOSIS — I426 Alcoholic cardiomyopathy: Secondary | ICD-10-CM | POA: Diagnosis not present

## 2024-05-31 DIAGNOSIS — S72141A Displaced intertrochanteric fracture of right femur, initial encounter for closed fracture: Secondary | ICD-10-CM | POA: Diagnosis not present

## 2024-06-01 DIAGNOSIS — R0902 Hypoxemia: Secondary | ICD-10-CM | POA: Diagnosis not present

## 2024-06-06 DIAGNOSIS — J9611 Chronic respiratory failure with hypoxia: Secondary | ICD-10-CM | POA: Diagnosis not present

## 2024-06-06 DIAGNOSIS — K047 Periapical abscess without sinus: Secondary | ICD-10-CM | POA: Diagnosis not present

## 2024-06-06 DIAGNOSIS — J449 Chronic obstructive pulmonary disease, unspecified: Secondary | ICD-10-CM | POA: Diagnosis not present

## 2024-06-06 DIAGNOSIS — Z9981 Dependence on supplemental oxygen: Secondary | ICD-10-CM | POA: Diagnosis not present

## 2024-06-09 DIAGNOSIS — I4729 Other ventricular tachycardia: Secondary | ICD-10-CM | POA: Insufficient documentation

## 2024-06-09 DIAGNOSIS — K8050 Calculus of bile duct without cholangitis or cholecystitis without obstruction: Secondary | ICD-10-CM | POA: Insufficient documentation

## 2024-06-09 DIAGNOSIS — Z5189 Encounter for other specified aftercare: Secondary | ICD-10-CM | POA: Insufficient documentation

## 2024-06-09 DIAGNOSIS — K8044 Calculus of bile duct with chronic cholecystitis without obstruction: Secondary | ICD-10-CM | POA: Insufficient documentation

## 2024-06-09 DIAGNOSIS — S72141A Displaced intertrochanteric fracture of right femur, initial encounter for closed fracture: Secondary | ICD-10-CM | POA: Insufficient documentation

## 2024-06-09 DIAGNOSIS — B9689 Other specified bacterial agents as the cause of diseases classified elsewhere: Secondary | ICD-10-CM | POA: Insufficient documentation

## 2024-06-09 DIAGNOSIS — E1165 Type 2 diabetes mellitus with hyperglycemia: Secondary | ICD-10-CM | POA: Insufficient documentation

## 2024-06-09 DIAGNOSIS — I959 Hypotension, unspecified: Secondary | ICD-10-CM | POA: Insufficient documentation

## 2024-06-09 DIAGNOSIS — J9601 Acute respiratory failure with hypoxia: Secondary | ICD-10-CM | POA: Insufficient documentation

## 2024-06-12 NOTE — Progress Notes (Unsigned)
 Cardiology Office Note:    Date:  06/13/2024   ID:  Jaime Scott, DOB March 21, 1951, MRN [id number]  PCP:  Jaime Males, MD  Cardiologist:  Jaime Leiter, MD    Referring MD: Jaime Males, MD    ASSESSMENT:    1. Chronic atrial fibrillation (HCC)   2. Long term (current) use of anticoagulants   3. Hypertensive heart disease with chronic combined systolic and diastolic congestive heart failure (HCC)   4. ICD (implantable cardioverter-defibrillator) in place   5. Biventricular cardiac pacemaker in situ   6. Chronic obstructive pulmonary disease, unspecified COPD type (HCC)    PLAN:    In order of problems listed above:  Jaime Scott is making good progress in skilled nursing recovering from hip fracture stable chronic atrial fibrillation pacemaker biventricular stable COPD heart failure compensated and will continue his current anticoagulant along with his loop diuretic ACE inhibitor beta-blocker and statin. Continue warfarin goal INR 2-2.5   Next appointment: 26-month follow-up with me   Medication Adjustments/Labs and Tests Ordered: Current medicines are reviewed at length with the patient today.  Concerns regarding medicines are outlined above.  No orders of the defined types were placed in this encounter.  No orders of the defined types were placed in this encounter.    History of Present Illness:    Jaime Scott is a 73 y.o. male with a hx of  chronic atrial fibrillation on long-term anticoagulation systolic heart failure not on beta-blocker due to underlying COPD he has hypertensive heart disease and an CRT/ICD PAD and CLL last seen 05/12/2023.  His last ejection fraction in March 2023 and improved to 45 to 50% with biatrial enlargement and moderate enlargement of ascending aorta.   Recently seen by me when admitted to [facility] 05/16/2024 with fall and fracture underwent ORIF I have without cardiac complication. Compliance with diet, lifestyle and  medications: Yes  He is progressing nicely at skilled nursing recovery from ORIF hip fracture Stop really is given too much oxygen  had a respiratory arrest fortunately did not require intubation He thinks he is progressing he is not having edema worsening shortness of breath orthopnea palpitation or syncope Past Medical History:  Diagnosis Date   Acute on chronic systolic CHF (congestive heart failure) (HCC) 07/09/2023   Acute on chronic systolic heart failure (HCC) 07/08/2023   Alcohol  abuse    Arthritis    Atrial fibrillation (HCC) 08/02/2018   CHF (congestive heart failure) (HCC) 03/12/2017   Chronic atrial fibrillation (HCC) 03/12/2017   Chronic respiratory failure (HCC)    Chronic systolic heart failure (HCC) 03/12/2017   COPD (chronic obstructive pulmonary disease) (HCC) 03/12/2017   Dilated cardiomyopathy (HCC) 03/12/2017   Dyspnea    wears Oxygen  24/7 3 L   Essential hypertension 03/12/2017   High risk medication use 05/05/2017   Hyperlipidemia 03/12/2017   Hypertensive heart disease with heart failure (HCC) 03/12/2017   ICD (implantable cardioverter-defibrillator) in place 07/30/2018   Ischemic cardiomyopathy 03/12/2017   LBBB (left bundle branch block) 06/20/2015   Long term (current) use of anticoagulants 03/12/2017   OSA (obstructive sleep apnea) 08/09/2018   Peripheral vascular disease 03/12/2017   Preoperative cardiovascular examination 03/25/2015   Presence of combination internal cardiac defibrillator (ICD) and pacemaker 03/12/2017   unable to place a CRT    Tobacco abuse 08/09/2018   Type 2 diabetes mellitus without complications (HCC) 03/12/2017    Current Medications: Current Meds  Medication Sig   budesonide -glycopyrrolate -formoterol  (BREZTRI ) 160-9-4.8 MCG/ACT AERO inhaler Inhale  2 puffs into the lungs 2 (two) times daily.   enalapril  (VASOTEC ) 5 MG tablet Take 0.5 tablets (2.5 mg total) by mouth daily.   eplerenone  (INSPRA ) 25 MG tablet Take 1 tablet  (25 mg total) by mouth daily.   furosemide  (LASIX ) 80 MG tablet Take 2 tablets (160 mg total) by mouth every morning AND 1 tablet (80 mg total) at bedtime.   metFORMIN  (GLUCOPHAGE -XR) 500 MG 24 hr tablet Take 2 tablets (1,000 mg total) by mouth 2 (two) times daily.   metoprolol  succinate (TOPROL -XL) 25 MG 24 hr tablet Take 1 tablet (25 mg total) by mouth daily with food   OXYGEN  Inhale 3 L into the lungs as needed.   Potassium Chloride  ER 20 MEQ TBCR Take 1 tablet (20 mEq total) by mouth 2 (two) times daily with food   rosuvastatin  (CRESTOR ) 20 MG tablet Take 1 tablet (20 mg total) by mouth daily.   TURMERIC EXTRA STRENGTH PO Take 1 tablet by mouth daily.   warfarin (COUMADIN ) 1 MG tablet Take 1 tablet (1 mg total) by mouth daily.   warfarin (COUMADIN ) 5 MG tablet Take 1 tablet (5 mg total) by mouth daily.      EKGs/Labs/Other Studies Reviewed:    The following studies were reviewed today:  Cardiac Studies & Procedures   ______________________________________________________________________________________________   STRESS TESTS  MYOCARDIAL PERFUSION IMAGING 03/25/2017   ECHOCARDIOGRAM  ECHOCARDIOGRAM COMPLETE 10/07/2021  Narrative ECHOCARDIOGRAM REPORT    Patient Name:   Jaime Scott Date of Exam: 10/07/2021 Medical Rec #:  [id number]         Height:       67.0 in Accession #:    [id number]        Weight:       229.2 lb Date of Birth:  03-Feb-1951         BSA:          2.143 m Patient Age:    71 years          BP:           92/58 mmHg Patient Gender: M                 HR:           83 bpm. Exam Location:  [place]  Procedure: 2D Echo, Cardiac Doppler, Color Doppler and Strain Analysis  Indications:    CHF-Acute Systolic I50.21  History:        Patient has no prior history of Echocardiogram examinations. Dilated cardiomyopathy, Pacemaker, COPD and PAD, Arrythmias:Atrial Fibrillation and LBBB, Signs/Symptoms:Dyspnea; Risk Factors:Dyslipidemia and  Hypertension.  Sonographer:    Jaime Scott RDCS Referring Phys: [id number] Jaime Scott  IMPRESSIONS   1. GLS -9.9. Left ventricular ejection fraction, by estimation, is 45 to 50%. The left ventricle has mildly decreased function. The left ventricle has no regional wall motion abnormalities. The left ventricular internal cavity size was mildly dilated. Left ventricular diastolic parameters are indeterminate. 2. Right ventricular systolic function is normal. The right ventricular size is normal. There is normal pulmonary artery systolic pressure. 3. Left atrial size was moderately dilated. 4. Right atrial size was mildly dilated. 5. The mitral valve is normal in structure. Mild mitral valve regurgitation. No evidence of mitral stenosis. 6. The aortic valve is normal in structure. Aortic valve regurgitation is mild. No aortic stenosis is present. 7. There is moderate dilatation of the ascending aorta, measuring 41 mm. 8. The inferior vena cava is normal in size with  greater than 50% respiratory variability, suggesting right atrial pressure of 3 mmHg.  FINDINGS Left Ventricle: GLS -9.9. Left ventricular ejection fraction, by estimation, is 45 to 50%. The left ventricle has mildly decreased function. The left ventricle has no regional wall motion abnormalities. The left ventricular internal cavity size was mildly dilated. There is no left ventricular hypertrophy. Abnormal (paradoxical) septal motion, consistent with RV pacemaker. Left ventricular diastolic parameters are indeterminate.  Right Ventricle: The right ventricular size is normal. No increase in right ventricular wall thickness. Right ventricular systolic function is normal. There is normal pulmonary artery systolic pressure. The tricuspid regurgitant velocity is 2.43 m/s, and with an assumed right atrial pressure of 8 mmHg, the estimated right ventricular systolic pressure is 31.6 mmHg.  Left Atrium: Left atrial size was  moderately dilated.  Right Atrium: Right atrial size was mildly dilated.  Pericardium: There is no evidence of pericardial effusion.  Mitral Valve: The mitral valve is normal in structure. Mild mitral valve regurgitation. No evidence of mitral valve stenosis.  Tricuspid Valve: The tricuspid valve is normal in structure. Tricuspid valve regurgitation is mild . No evidence of tricuspid stenosis.  Aortic Valve: The aortic valve is normal in structure. Aortic valve regurgitation is mild. Aortic regurgitation PHT measures 660 msec. No aortic stenosis is present.  Pulmonic Valve: The pulmonic valve was normal in structure. Pulmonic valve regurgitation is not visualized. No evidence of pulmonic stenosis.  Aorta: The aortic root is normal in size and structure. There is moderate dilatation of the ascending aorta, measuring 41 mm.  Venous: The inferior vena cava is normal in size with greater than 50% respiratory variability, suggesting right atrial pressure of 3 mmHg.  IAS/Shunts: No atrial level shunt detected by color flow Doppler.  Additional Comments: A device lead is visualized.   LEFT VENTRICLE PLAX 2D LVIDd:         5.90 cm      Diastology LVIDs:         4.50 cm      LV e' medial:    7.94 cm/s LV PW:         1.10 cm      LV E/e' medial:  12.0 LV IVS:        1.00 cm      LV e' lateral:   12.00 cm/s LVOT diam:     2.00 cm      LV E/e' lateral: 8.0 LV SV:         56 LV SV Index:   26 LVOT Area:     3.14 cm  LV Volumes (MOD) LV vol d, MOD A2C: 108.0 ml LV vol d, MOD A4C: 113.0 ml LV vol s, MOD A2C: 54.1 ml LV vol s, MOD A4C: 63.7 ml LV SV MOD A2C:     53.9 ml LV SV MOD A4C:     113.0 ml LV SV MOD BP:      52.5 ml  RIGHT VENTRICLE            IVC RV S prime:     9.57 cm/s  IVC diam: 2.60 cm TAPSE (M-mode): 1.9 cm  LEFT ATRIUM              Index        RIGHT ATRIUM           Index LA diam:        4.70 cm  2.19 cm/m   RA Area:     21.80 cm LA  Vol (A2C):   110.0 ml 51.33  ml/m  RA Volume:   72.70 ml  33.93 ml/m LA Vol (A4C):   101.0 ml 47.13 ml/m LA Biplane Vol: 110.0 ml 51.33 ml/m AORTIC VALVE LVOT Vmax:   103.00 cm/s LVOT Vmean:  66.600 cm/s LVOT VTI:    0.177 m AI PHT:      660 msec  AORTA Ao Root diam: 3.60 cm Ao Asc diam:  4.10 cm  MITRAL VALVE               TRICUSPID VALVE MV Area (PHT): 3.53 cm    TR Peak grad:   23.6 mmHg MV Decel Time: 215 msec    TR Vmax:        243.00 cm/s MV E velocity: 95.50 cm/s MV A velocity: 25.10 cm/s  SHUNTS MV E/A ratio:  3.80        Systemic VTI:  0.18 m Systemic Diam: 2.00 cm  Lamar Fitch MD Electronically signed by Lamar Fitch MD Signature Date/Time: 10/07/2021/12:32:46 PM    Final          ______________________________________________________________________________________________          Recent Labs: 07/09/2023: Hemoglobin 11.2; Platelets 252 01/21/2024: BUN 8; Creatinine, Ser 0.80; NT-Pro BNP 1,167; Potassium 4.1; Sodium 142  Recent Lipid Panel No results found for: CHOL, TRIG, HDL, CHOLHDL, VLDL, LDLCALC, LDLDIRECT  Physical Exam:    VS:  BP 100/64   Pulse 62   Ht 5' 7 (1.702 m)   Wt 199 lb 3.2 oz (90.4 kg)   SpO2 97%   BMI 31.20 kg/m     Wt Readings from Last 3 Encounters:  06/13/24 199 lb 3.2 oz (90.4 kg)  05/11/24 195 lb 9.6 oz (88.7 kg)  10/26/23 198 lb (89.8 kg)     GEN: COPD appearance well nourished, well developed in no acute distress HEENT: Normal NECK: No JVD; No carotid bruits LYMPHATICS: No lymphadenopathy CARDIAC: Soft S1 RRR, no murmurs, rubs, gallops RESPIRATORY: Hyperinflated diminished breath sounds with rhonchi ABDOMEN: Soft, non-tender, non-distended MUSCULOSKELETAL:  No edema; No deformity  SKIN: Warm and dry NEUROLOGIC:  Alert and oriented x 3 PSYCHIATRIC:  Normal affect    Signed, Jaime Leiter, MD  06/13/2024 4:05 PM    [facility]

## 2024-06-13 ENCOUNTER — Encounter: Payer: Self-pay | Admitting: Cardiology

## 2024-06-13 ENCOUNTER — Ambulatory Visit: Attending: Cardiology | Admitting: Cardiology

## 2024-06-13 VITALS — BP 100/64 | HR 62 | Ht 67.0 in | Wt 199.2 lb

## 2024-06-13 DIAGNOSIS — Z95810 Presence of automatic (implantable) cardiac defibrillator: Secondary | ICD-10-CM | POA: Diagnosis not present

## 2024-06-13 DIAGNOSIS — I5042 Chronic combined systolic (congestive) and diastolic (congestive) heart failure: Secondary | ICD-10-CM | POA: Diagnosis not present

## 2024-06-13 DIAGNOSIS — I110 Hypertensive heart disease with heart failure: Secondary | ICD-10-CM | POA: Diagnosis not present

## 2024-06-13 DIAGNOSIS — Z950 Presence of cardiac pacemaker: Secondary | ICD-10-CM | POA: Insufficient documentation

## 2024-06-13 DIAGNOSIS — J449 Chronic obstructive pulmonary disease, unspecified: Secondary | ICD-10-CM | POA: Insufficient documentation

## 2024-06-13 DIAGNOSIS — I482 Chronic atrial fibrillation, unspecified: Secondary | ICD-10-CM | POA: Insufficient documentation

## 2024-06-13 DIAGNOSIS — Z7901 Long term (current) use of anticoagulants: Secondary | ICD-10-CM | POA: Insufficient documentation

## 2024-06-13 NOTE — Patient Instructions (Signed)
 Medication Instructions:  Your physician recommends that you continue on your current medications as directed. Please refer to the Current Medication list given to you today.  *If you need a refill on your cardiac medications before your next appointment, please call your pharmacy*  Lab Work: NONE If you have labs (blood work) drawn today and your tests are completely normal, you will receive your results only by: MyChart Message (if you have MyChart) OR A paper copy in the mail If you have any lab test that is abnormal or we need to change your treatment, we will call you to review the results.  Testing/Procedures: NONE  Follow-Up: At [facility], you and your health needs are our priority.  As part of our continuing mission to provide you with exceptional heart care, our providers are all part of one team.  This team includes your primary Cardiologist (physician) and Advanced Practice Providers or APPs (Physician Assistants and Nurse Practitioners) who all work together to provide you with the care you need, when you need it.  Your next appointment:   6 month(s)  Provider:   Norman Herrlich, MD    We recommend signing up for the patient portal called "MyChart".  Sign up information is provided on this After Visit Summary.  MyChart is used to connect with patients for Virtual Visits (Telemedicine).  Patients are able to view lab/test results, encounter notes, upcoming appointments, etc.  Non-urgent messages can be sent to your provider as well.   To learn more about what you can do with MyChart, go to [email or web address].   Other Instructions

## 2024-06-15 DIAGNOSIS — R609 Edema, unspecified: Secondary | ICD-10-CM | POA: Diagnosis not present

## 2024-06-15 DIAGNOSIS — I4891 Unspecified atrial fibrillation: Secondary | ICD-10-CM | POA: Diagnosis not present

## 2024-06-22 ENCOUNTER — Other Ambulatory Visit (HOSPITAL_BASED_OUTPATIENT_CLINIC_OR_DEPARTMENT_OTHER): Payer: Self-pay

## 2024-06-22 DIAGNOSIS — J449 Chronic obstructive pulmonary disease, unspecified: Secondary | ICD-10-CM | POA: Diagnosis not present

## 2024-06-22 DIAGNOSIS — R262 Difficulty in walking, not elsewhere classified: Secondary | ICD-10-CM | POA: Diagnosis not present

## 2024-06-22 DIAGNOSIS — Z8781 Personal history of (healed) traumatic fracture: Secondary | ICD-10-CM | POA: Diagnosis not present

## 2024-06-22 DIAGNOSIS — J9611 Chronic respiratory failure with hypoxia: Secondary | ICD-10-CM | POA: Diagnosis not present

## 2024-06-22 MED ORDER — OXYCODONE HCL 5 MG PO TABS
5.0000 mg | ORAL_TABLET | Freq: Three times a day (TID) | ORAL | 0 refills | Status: AC | PRN
  Filled 2024-06-22: qty 21, 7d supply, fill #0

## 2024-06-24 ENCOUNTER — Other Ambulatory Visit (HOSPITAL_BASED_OUTPATIENT_CLINIC_OR_DEPARTMENT_OTHER): Payer: Self-pay

## 2024-06-24 MED ORDER — FUROSEMIDE 80 MG PO TABS
ORAL_TABLET | ORAL | 0 refills | Status: DC
  Filled 2024-06-24: qty 90, 30d supply, fill #0

## 2024-06-24 MED ORDER — EPLERENONE 25 MG PO TABS
25.0000 mg | ORAL_TABLET | Freq: Every day | ORAL | 0 refills | Status: AC
  Filled 2024-06-24: qty 30, 30d supply, fill #0

## 2024-06-24 MED ORDER — FUROSEMIDE 80 MG PO TABS
160.0000 mg | ORAL_TABLET | Freq: Every day | ORAL | 0 refills | Status: DC
  Filled 2024-06-24: qty 60, 30d supply, fill #0

## 2024-06-24 MED ORDER — IPRATROPIUM-ALBUTEROL 0.5-2.5 (3) MG/3ML IN SOLN
3.0000 mL | Freq: Three times a day (TID) | RESPIRATORY_TRACT | 0 refills | Status: AC
  Filled 2024-06-24: qty 90, 10d supply, fill #0

## 2024-06-24 MED ORDER — WARFARIN SODIUM 4 MG PO TABS
4.0000 mg | ORAL_TABLET | Freq: Every evening | ORAL | 0 refills | Status: AC
  Filled 2024-06-24: qty 14, 14d supply, fill #0

## 2024-06-24 MED ORDER — METFORMIN HCL 1000 MG PO TABS
1000.0000 mg | ORAL_TABLET | Freq: Two times a day (BID) | ORAL | 0 refills | Status: AC
  Filled 2024-06-24: qty 60, 30d supply, fill #0

## 2024-06-24 MED ORDER — PANTOPRAZOLE SODIUM 40 MG PO TBEC
40.0000 mg | DELAYED_RELEASE_TABLET | Freq: Every day | ORAL | 0 refills | Status: DC
  Filled 2024-06-24: qty 30, 30d supply, fill #0

## 2024-06-24 MED ORDER — BUDESON-GLYCOPYRROL-FORMOTEROL 160-9-4.8 MCG/ACT IN AERO
2.0000 | INHALATION_SPRAY | Freq: Two times a day (BID) | RESPIRATORY_TRACT | 0 refills | Status: AC
  Filled 2024-06-24: qty 10.7, 30d supply, fill #0

## 2024-06-24 MED ORDER — POTASSIUM CHLORIDE CRYS ER 20 MEQ PO TBCR
20.0000 meq | EXTENDED_RELEASE_TABLET | Freq: Two times a day (BID) | ORAL | 0 refills | Status: AC
  Filled 2024-06-24: qty 60, 30d supply, fill #0

## 2024-06-24 MED ORDER — ENALAPRIL MALEATE 2.5 MG PO TABS
2.5000 mg | ORAL_TABLET | Freq: Every day | ORAL | 0 refills | Status: AC
  Filled 2024-06-24: qty 30, 30d supply, fill #0

## 2024-06-24 MED ORDER — ROSUVASTATIN CALCIUM 20 MG PO TABS
20.0000 mg | ORAL_TABLET | Freq: Every evening | ORAL | 0 refills | Status: AC
  Filled 2024-06-24: qty 30, 30d supply, fill #0

## 2024-06-24 MED ORDER — METOPROLOL SUCCINATE ER 25 MG PO TB24
25.0000 mg | ORAL_TABLET | Freq: Every day | ORAL | 0 refills | Status: AC
  Filled 2024-06-24: qty 30, 30d supply, fill #0

## 2024-06-24 MED ORDER — WARFARIN SODIUM 3 MG PO TABS
3.0000 mg | ORAL_TABLET | Freq: Every evening | ORAL | 0 refills | Status: AC
  Filled 2024-06-24: qty 14, 14d supply, fill #0

## 2024-06-26 DIAGNOSIS — I10 Essential (primary) hypertension: Secondary | ICD-10-CM | POA: Diagnosis not present

## 2024-06-26 DIAGNOSIS — E7800 Pure hypercholesterolemia, unspecified: Secondary | ICD-10-CM | POA: Diagnosis not present

## 2024-06-26 DIAGNOSIS — E1169 Type 2 diabetes mellitus with other specified complication: Secondary | ICD-10-CM | POA: Diagnosis not present

## 2024-06-26 DIAGNOSIS — I4891 Unspecified atrial fibrillation: Secondary | ICD-10-CM | POA: Diagnosis not present

## 2024-06-26 DIAGNOSIS — Z79899 Other long term (current) drug therapy: Secondary | ICD-10-CM | POA: Diagnosis not present

## 2024-06-26 DIAGNOSIS — Z7901 Long term (current) use of anticoagulants: Secondary | ICD-10-CM | POA: Diagnosis not present

## 2024-06-26 DIAGNOSIS — J449 Chronic obstructive pulmonary disease, unspecified: Secondary | ICD-10-CM | POA: Diagnosis not present

## 2024-06-26 DIAGNOSIS — I482 Chronic atrial fibrillation, unspecified: Secondary | ICD-10-CM | POA: Diagnosis not present

## 2024-07-05 ENCOUNTER — Other Ambulatory Visit (HOSPITAL_BASED_OUTPATIENT_CLINIC_OR_DEPARTMENT_OTHER): Payer: Self-pay

## 2024-07-05 ENCOUNTER — Other Ambulatory Visit: Payer: Self-pay

## 2024-07-05 DIAGNOSIS — I482 Chronic atrial fibrillation, unspecified: Secondary | ICD-10-CM | POA: Diagnosis not present

## 2024-07-05 DIAGNOSIS — I5022 Chronic systolic (congestive) heart failure: Secondary | ICD-10-CM | POA: Diagnosis not present

## 2024-07-05 MED ORDER — EPLERENONE 25 MG PO TABS
25.0000 mg | ORAL_TABLET | Freq: Every day | ORAL | 0 refills | Status: DC
  Filled 2024-07-05 – 2024-07-07 (×4): qty 90, 90d supply, fill #0

## 2024-07-05 MED ORDER — FUROSEMIDE 80 MG PO TABS
ORAL_TABLET | ORAL | 0 refills | Status: AC
  Filled 2024-07-05: qty 200, 67d supply, fill #0

## 2024-07-05 MED ORDER — METFORMIN HCL ER 500 MG PO TB24
1000.0000 mg | ORAL_TABLET | Freq: Two times a day (BID) | ORAL | 0 refills | Status: AC
  Filled 2024-07-05 – 2024-07-07 (×4): qty 360, 90d supply, fill #0

## 2024-07-05 MED ORDER — ROSUVASTATIN CALCIUM 20 MG PO TABS
20.0000 mg | ORAL_TABLET | Freq: Every day | ORAL | 1 refills | Status: AC
  Filled 2024-07-05: qty 90, 90d supply, fill #0

## 2024-07-05 MED ORDER — TRAMADOL HCL 50 MG PO TABS
50.0000 mg | ORAL_TABLET | Freq: Four times a day (QID) | ORAL | 0 refills | Status: DC | PRN
  Filled 2024-07-05: qty 28, 7d supply, fill #0

## 2024-07-05 MED ORDER — POTASSIUM CHLORIDE ER 20 MEQ PO TBCR
20.0000 meq | EXTENDED_RELEASE_TABLET | Freq: Three times a day (TID) | ORAL | 1 refills | Status: AC
  Filled 2024-07-05 – 2024-07-07 (×4): qty 180, 60d supply, fill #0

## 2024-07-06 ENCOUNTER — Other Ambulatory Visit (HOSPITAL_COMMUNITY): Payer: Self-pay

## 2024-07-06 ENCOUNTER — Other Ambulatory Visit (HOSPITAL_BASED_OUTPATIENT_CLINIC_OR_DEPARTMENT_OTHER): Payer: Self-pay

## 2024-07-07 ENCOUNTER — Other Ambulatory Visit (HOSPITAL_COMMUNITY): Payer: Self-pay

## 2024-07-07 ENCOUNTER — Ambulatory Visit: Payer: Medicare Other

## 2024-07-07 ENCOUNTER — Other Ambulatory Visit: Payer: Self-pay

## 2024-07-07 ENCOUNTER — Other Ambulatory Visit (HOSPITAL_BASED_OUTPATIENT_CLINIC_OR_DEPARTMENT_OTHER): Payer: Self-pay

## 2024-07-07 MED ORDER — WARFARIN SODIUM 5 MG PO TABS
5.0000 mg | ORAL_TABLET | Freq: Every day | ORAL | 1 refills | Status: AC
  Filled 2024-07-07 (×3): qty 90, 90d supply, fill #0

## 2024-07-07 MED ORDER — WARFARIN SODIUM 1 MG PO TABS
1.0000 mg | ORAL_TABLET | Freq: Every day | ORAL | 1 refills | Status: AC
  Filled 2024-07-07 – 2024-07-11 (×5): qty 90, 90d supply, fill #0

## 2024-07-08 ENCOUNTER — Other Ambulatory Visit (HOSPITAL_BASED_OUTPATIENT_CLINIC_OR_DEPARTMENT_OTHER): Payer: Self-pay

## 2024-07-08 ENCOUNTER — Other Ambulatory Visit (HOSPITAL_COMMUNITY): Payer: Self-pay

## 2024-07-08 ENCOUNTER — Other Ambulatory Visit: Payer: Self-pay

## 2024-07-08 LAB — CUP PACEART REMOTE DEVICE CHECK
Battery Remaining Longevity: 82 mo
Battery Voltage: 2.98 V
Brady Statistic RV Percent Paced: 98.92 %
Date Time Interrogation Session: 20251211010150
HighPow Impedance: 74 Ohm
Implantable Lead Connection Status: 753985
Implantable Lead Connection Status: 753985
Implantable Lead Implant Date: 20200103
Implantable Lead Implant Date: 20241211
Implantable Lead Location: 753858
Implantable Lead Location: 753860
Implantable Lead Model: 4398
Implantable Lead Model: 6935
Implantable Pulse Generator Implant Date: 20241211
Lead Channel Impedance Value: 266 Ohm
Lead Channel Impedance Value: 3000 Ohm
Lead Channel Impedance Value: 323 Ohm
Lead Channel Impedance Value: 342 Ohm
Lead Channel Impedance Value: 418 Ohm
Lead Channel Impedance Value: 437 Ohm
Lead Channel Impedance Value: 437 Ohm
Lead Channel Impedance Value: 456 Ohm
Lead Channel Impedance Value: 532 Ohm
Lead Channel Impedance Value: 551 Ohm
Lead Channel Impedance Value: 646 Ohm
Lead Channel Impedance Value: 722 Ohm
Lead Channel Impedance Value: 722 Ohm
Lead Channel Pacing Threshold Amplitude: 0.5 V
Lead Channel Pacing Threshold Amplitude: 1.125 V
Lead Channel Pacing Threshold Pulse Width: 0.4 ms
Lead Channel Pacing Threshold Pulse Width: 0.6 ms
Lead Channel Sensing Intrinsic Amplitude: 12.6 mV
Lead Channel Setting Pacing Amplitude: 2 V
Lead Channel Setting Pacing Amplitude: 2.5 V
Lead Channel Setting Pacing Pulse Width: 0.4 ms
Lead Channel Setting Pacing Pulse Width: 0.6 ms
Lead Channel Setting Sensing Sensitivity: 0.3 mV
Zone Setting Status: 755011

## 2024-07-10 ENCOUNTER — Ambulatory Visit: Payer: Self-pay | Admitting: Internal Medicine

## 2024-07-11 ENCOUNTER — Other Ambulatory Visit (HOSPITAL_COMMUNITY): Payer: Self-pay

## 2024-07-11 ENCOUNTER — Other Ambulatory Visit: Payer: Self-pay

## 2024-07-12 ENCOUNTER — Other Ambulatory Visit (HOSPITAL_COMMUNITY): Payer: Self-pay

## 2024-07-13 ENCOUNTER — Other Ambulatory Visit (HOSPITAL_COMMUNITY): Payer: Self-pay

## 2024-07-19 ENCOUNTER — Other Ambulatory Visit: Payer: Self-pay

## 2024-07-19 ENCOUNTER — Other Ambulatory Visit (HOSPITAL_COMMUNITY): Payer: Self-pay

## 2024-07-19 ENCOUNTER — Other Ambulatory Visit (HOSPITAL_BASED_OUTPATIENT_CLINIC_OR_DEPARTMENT_OTHER): Payer: Self-pay

## 2024-07-19 MED ORDER — ENALAPRIL MALEATE 5 MG PO TABS
2.5000 mg | ORAL_TABLET | Freq: Every day | ORAL | 0 refills | Status: AC
  Filled 2024-07-19: qty 90, 90d supply, fill #0
  Filled 2024-07-19: qty 15, 30d supply, fill #0
  Filled 2024-08-24: qty 15, 30d supply, fill #1

## 2024-07-19 MED ORDER — PANTOPRAZOLE SODIUM 40 MG PO TBEC
40.0000 mg | DELAYED_RELEASE_TABLET | Freq: Every day | ORAL | 0 refills | Status: AC
  Filled 2024-07-19 (×2): qty 90, 90d supply, fill #0

## 2024-07-19 MED ORDER — FUROSEMIDE 80 MG PO TABS
ORAL_TABLET | ORAL | 0 refills | Status: AC
  Filled 2024-07-19: qty 270, 90d supply, fill #0
  Filled 2024-07-19: qty 90, 30d supply, fill #0

## 2024-07-19 MED ORDER — EPLERENONE 25 MG PO TABS
25.0000 mg | ORAL_TABLET | Freq: Every day | ORAL | 0 refills | Status: AC
  Filled 2024-07-19: qty 90, 90d supply, fill #0

## 2024-07-19 MED ORDER — ROSUVASTATIN CALCIUM 20 MG PO TABS
20.0000 mg | ORAL_TABLET | Freq: Every day | ORAL | 1 refills | Status: AC
  Filled 2024-07-19 (×2): qty 90, 90d supply, fill #0

## 2024-07-19 MED ORDER — TRAMADOL HCL 50 MG PO TABS
50.0000 mg | ORAL_TABLET | Freq: Four times a day (QID) | ORAL | 0 refills | Status: DC | PRN
  Filled 2024-07-19: qty 28, 7d supply, fill #0

## 2024-07-19 MED ORDER — METOPROLOL SUCCINATE ER 25 MG PO TB24
12.5000 mg | ORAL_TABLET | Freq: Every day | ORAL | 0 refills | Status: AC
  Filled 2024-07-19 (×2): qty 45, 90d supply, fill #0

## 2024-07-26 ENCOUNTER — Other Ambulatory Visit (HOSPITAL_BASED_OUTPATIENT_CLINIC_OR_DEPARTMENT_OTHER): Payer: Self-pay

## 2024-07-26 MED ORDER — EPLERENONE 25 MG PO TABS
25.0000 mg | ORAL_TABLET | Freq: Every day | ORAL | 0 refills | Status: AC
  Filled 2024-07-26: qty 90, 90d supply, fill #0

## 2024-08-08 ENCOUNTER — Other Ambulatory Visit (HOSPITAL_BASED_OUTPATIENT_CLINIC_OR_DEPARTMENT_OTHER): Payer: Self-pay

## 2024-08-15 ENCOUNTER — Other Ambulatory Visit (HOSPITAL_BASED_OUTPATIENT_CLINIC_OR_DEPARTMENT_OTHER): Payer: Self-pay

## 2024-08-15 MED ORDER — FUROSEMIDE 80 MG PO TABS
ORAL_TABLET | ORAL | 0 refills | Status: AC
  Filled 2024-08-15 – 2024-08-16 (×2): qty 270, 90d supply, fill #0

## 2024-08-15 MED ORDER — TRAMADOL HCL 50 MG PO TABS
50.0000 mg | ORAL_TABLET | Freq: Four times a day (QID) | ORAL | 0 refills | Status: DC | PRN
  Filled 2024-08-15: qty 28, 7d supply, fill #0

## 2024-08-16 ENCOUNTER — Other Ambulatory Visit: Payer: Self-pay

## 2024-08-16 ENCOUNTER — Other Ambulatory Visit (HOSPITAL_BASED_OUTPATIENT_CLINIC_OR_DEPARTMENT_OTHER): Payer: Self-pay

## 2024-08-24 ENCOUNTER — Other Ambulatory Visit (HOSPITAL_BASED_OUTPATIENT_CLINIC_OR_DEPARTMENT_OTHER): Payer: Self-pay

## 2024-08-24 MED ORDER — TRAMADOL HCL 50 MG PO TABS
50.0000 mg | ORAL_TABLET | Freq: Four times a day (QID) | ORAL | 0 refills | Status: DC | PRN
  Filled 2024-08-24: qty 28, 7d supply, fill #0

## 2024-08-25 ENCOUNTER — Other Ambulatory Visit (HOSPITAL_BASED_OUTPATIENT_CLINIC_OR_DEPARTMENT_OTHER): Payer: Self-pay

## 2024-08-29 ENCOUNTER — Other Ambulatory Visit (HOSPITAL_BASED_OUTPATIENT_CLINIC_OR_DEPARTMENT_OTHER): Payer: Self-pay

## 2024-08-30 ENCOUNTER — Other Ambulatory Visit (HOSPITAL_BASED_OUTPATIENT_CLINIC_OR_DEPARTMENT_OTHER): Payer: Self-pay

## 2024-08-30 MED ORDER — TRAMADOL HCL 50 MG PO TABS
50.0000 mg | ORAL_TABLET | Freq: Four times a day (QID) | ORAL | 0 refills | Status: AC | PRN
  Filled 2024-08-30: qty 28, 7d supply, fill #0

## 2024-09-02 ENCOUNTER — Other Ambulatory Visit (HOSPITAL_BASED_OUTPATIENT_CLINIC_OR_DEPARTMENT_OTHER): Payer: Self-pay
